# Patient Record
Sex: Female | Born: 1935
Health system: Southern US, Community
[De-identification: ages and names within clinical notes are randomized; demographics above are authoritative.]

## PROBLEM LIST (undated history)

## (undated) DIAGNOSIS — F419 Anxiety disorder, unspecified: Secondary | ICD-10-CM

## (undated) DIAGNOSIS — E782 Mixed hyperlipidemia: Secondary | ICD-10-CM

## (undated) DIAGNOSIS — I10 Essential (primary) hypertension: Secondary | ICD-10-CM

## (undated) DIAGNOSIS — R079 Chest pain, unspecified: Secondary | ICD-10-CM

## (undated) DIAGNOSIS — H3530 Unspecified macular degeneration: Secondary | ICD-10-CM

## (undated) DIAGNOSIS — M1990 Unspecified osteoarthritis, unspecified site: Secondary | ICD-10-CM

## (undated) DIAGNOSIS — K219 Gastro-esophageal reflux disease without esophagitis: Secondary | ICD-10-CM

## (undated) DIAGNOSIS — M810 Age-related osteoporosis without current pathological fracture: Secondary | ICD-10-CM

## (undated) DIAGNOSIS — R9431 Abnormal electrocardiogram [ECG] [EKG]: Secondary | ICD-10-CM

## (undated) DIAGNOSIS — S329XXA Fracture of unspecified parts of lumbosacral spine and pelvis, initial encounter for closed fracture: Secondary | ICD-10-CM

## (undated) DIAGNOSIS — T7840XA Allergy, unspecified, initial encounter: Secondary | ICD-10-CM

## (undated) DIAGNOSIS — H269 Unspecified cataract: Secondary | ICD-10-CM

## (undated) HISTORY — PX: REFRACTIVE SURGERY: SHX103

## (undated) HISTORY — DX: Fracture of unspecified parts of lumbosacral spine and pelvis, initial encounter for closed fracture: S32.9XXA

## (undated) HISTORY — DX: Abnormal electrocardiogram (ECG) (EKG): R94.31

## (undated) HISTORY — DX: Anxiety disorder, unspecified: F41.9

## (undated) HISTORY — DX: Gastro-esophageal reflux disease without esophagitis: K21.9

## (undated) HISTORY — DX: Unspecified cataract: H26.9

## (undated) HISTORY — PX: COLONOSCOPY: SHX174

## (undated) HISTORY — DX: Chest pain, unspecified: R07.9

## (undated) HISTORY — DX: Age-related osteoporosis without current pathological fracture: M81.0

## (undated) HISTORY — DX: Essential (primary) hypertension: I10

## (undated) HISTORY — DX: Allergy, unspecified, initial encounter: T78.40XA

## (undated) HISTORY — DX: Unspecified osteoarthritis, unspecified site: M19.90

## (undated) HISTORY — PX: APPENDECTOMY: SHX54

## (undated) HISTORY — PX: TONSILLECTOMY: SUR1361

## (undated) HISTORY — DX: Mixed hyperlipidemia: E78.2

## (undated) HISTORY — DX: Unspecified macular degeneration: H35.30

## (undated) HISTORY — PX: ABDOMINAL HYSTERECTOMY: SHX81

---

## 2001-01-17 ENCOUNTER — Inpatient Hospital Stay (HOSPITAL_COMMUNITY): Admission: RE | Admit: 2001-01-17 | Discharge: 2001-01-19 | Payer: Self-pay | Admitting: Urology

## 2006-09-30 ENCOUNTER — Encounter: Admission: RE | Admit: 2006-09-30 | Discharge: 2006-09-30 | Payer: Self-pay | Admitting: Family Medicine

## 2007-04-20 ENCOUNTER — Encounter: Admission: RE | Admit: 2007-04-20 | Discharge: 2007-04-20 | Payer: Self-pay | Admitting: Family Medicine

## 2008-04-20 HISTORY — PX: ESOPHAGOGASTRODUODENOSCOPY: SHX1529

## 2008-09-25 ENCOUNTER — Encounter: Admission: RE | Admit: 2008-09-25 | Discharge: 2008-09-25 | Payer: Self-pay | Admitting: Family Medicine

## 2009-11-12 ENCOUNTER — Encounter: Admission: RE | Admit: 2009-11-12 | Discharge: 2009-11-12 | Payer: Self-pay | Admitting: Family Medicine

## 2010-05-11 ENCOUNTER — Encounter: Payer: Self-pay | Admitting: Family Medicine

## 2010-09-05 NOTE — Discharge Summary (Signed)
[facility]  Patient:    Jodi Jefferson, Jodi Jefferson Visit Number: [id number] MRN: [id number]          Service Type: SUR Location: 3W 0355 01 Attending Physician:  Lauree Chandler Dictated by:   Maretta Bees. Vonita Moss, M.D. Admit Date:  01/17/2001 Disc. Date: 01/19/01                             Discharge Summary  FINAL DIAGNOSES: 1. Third degree cystocele. 2. Osteoporosis.  PROCEDURE:  Vaginosacropexy and suprapubic bladder suspension January 17, 2001.  HISTORY OF PRESENT ILLNESS:  This 77 year old white female has had pain and discomfort from a protruding bladder and was counseled about surgical correction and admitted for that reason.  She was cleared for surgery by her medical doctor after performing an echocardiogram with Doppler studies.  Her main medical problem is some osteoporosis.  PHYSICAL EXAMINATION:  Revealed third degree cystocele.  MEDICATIONS: 1. Fosamax 10 mg a day. 2. Estradial 1 mg daily. 3. Centrum Silver vitamins q.d. 4. Os-Cal vitamin D.  HOSPITAL COURSE:  After admission she underwent a vaginosacropexy and suprapubic bladder suspension.  Her only problem postoperatively was some nausea controlled with Zofran and Reglan.  She was eating well and ambulating well with little pain and afebrile.  On January 19, 2001 she was ready for discharge.  DISCHARGE INSTRUCTIONS:  She will go home on her regular medications, but will hold off her Fosamax for a couple of more days and she will use oxycodone for pain and will take five more days of Levaquin 1/2 g daily.  She will see me next week in the office at follow up.  She was sent home on diet as tolerated and light activity for six weeks.  She was sent home in good condition. Dictated by:   Maretta Bees. Vonita Moss, M.D. Attending Physician:  Lauree Chandler DD:  01/19/01 TD:  01/19/01 Job: [id number]

## 2010-09-05 NOTE — Op Note (Signed)
[facility]  Patient:    Jodi Jefferson, Jodi Jefferson Visit Number: [id number] MRN: [id number]          Service Type: SUR Location: 1S X001 01 Attending Physician:  Lauree Chandler Dictated by:   Maretta Bees. Vonita Moss, M.D. Proc. Date: 01/17/01 Admit Date:  01/17/2001                             Operative Report  PREOPERATIVE DIAGNOSIS:  Third degree cystocele.  POSTOPERATIVE DIAGNOSIS:  Third degree cystocele.  PROCEDURE:  Vaginosacropexy and suprapubic bladder suspension.  SURGEON:  Maretta Bees. Vonita Moss, M.D.  ASSISTANT:  Excell Seltzer. Annabell Howells, M.D.  ANESTHESIA:  General.  INDICATION:  This 77 year old white female has had a long history of a cystocele and has become much worse lately and protruding out the introitus and causing her pelvic pain and pressure.  She is cleared for surgery by her medical doctor.  She has had a previous total abdominal hysterectomy.  DESCRIPTION OF PROCEDURE:  The patient was brought to the operating room, placed in the supine position in frog-leg position and her lower abdomen and vaginal canal were prepped and draped in usual fashion.  A 22-French Foley catheter was inserted into the bladder.  A suprapubic incision was made and the bladder identified and very top of the bladder secured and identified with a figure-of-eight suture of 0 chromic catgut.  The peritoneum was then opened and the small and large intestine retracted superiorly.  The sacral promontory was exposed and an incision was made into that and a few tiny bleeders were coagulated and two sutures of 2-0 Prolene were placed on each side of the sacral promontory for later suturing to her Marlex mesh.  Her enterocele on the right side was then closed with a pursestring suture of 2-0 chromic catgut.  The top of the vaginal vault was then identified with a narrow Deaver placed per vagina.  An incision was made in the peritoneum and dissected back to allow [place] to place  two sutures of 2-0 Prolene on the right and left sides posteriorly and two more on the right and left sides anteriorly.  The ends of the 1 x 3-inch Marlex mesh which had been soaked in antibiotic solution were sutured to the front and to the back of the vaginal vault and then the midsection of this sutured to the previously placed Prolene sutures in the sacral promontory; this suspended the vaginal vault and canal quite nicely. The Marlex mesh was then reperitonealized and there was no evidence of any herniation defects.  The bladder was then identified just above the bladder neck in the midline and figure-of-eight suture of 0 chromic catgut was placed and sutured to the top of the symphysis pubis and two more midline bladder sutures were later placed and sewn to the overlying rectus fascia and the initial very superior anterior suture in the bladder was sewn to the rectus fascia, suspending the bladder well.  The wound was irrigated with triple-antibiotic solution.  The peritoneum was closed with a running 2-0 chromic catgut.  The rectus fascia was closed with running #1 PDS.  Skin was closed with skin staples.  The wound was cleaned, dressed with dry sterile gauze dressings, Foley catheter was connected to closed drainage and patient taken to recovery room in good condition with negligible blood loss and correct sponge, needle and instrument counts.  She tolerated procedure well and wound was dressed with  dry sterile gauze dressings. Dictated by:   Maretta Bees. Vonita Moss, M.D. Attending Physician:  Lauree Chandler DD:  01/17/01 TD:  01/17/01 Job: [id number]

## 2010-09-05 NOTE — H&P (Signed)
[facility]  Patient:    ROBBYE, DEDE Visit Number: [id number] MRN: [id number]          Service Type: SUR Location: 1S X001 01 Attending Physician:  Lauree Chandler Dictated by:   Maretta Bees. Vonita Moss, M.D. Admit Date:  01/17/2001                           History and Physical  HISTORY:  This 77 year old white female was seen by me in consultation for Dr. Nadine Counts on December 08, 2000 because of a long history of a bladder prolapse and exacerbation recently with protruding bladder out the vaginal introitus, causing her pelvic pressure and discomfort.  She had frequency, but no dysuria and no particular incontinence.  She was very physically active and wished this repaired.  I felt that a vaginosacropexy and suprapubic bladder suspension would be the most appropriate way to manage this.  She was cleared for surgery by Dr. Harrison Mons, without did an echocardiogram with Doppler.  this showed some LVH and an estimated ejection fraction of 65% to 70% and just some physiologic mitral and tricuspid regurgitation, but no concern about coronary artery disease.  PAST MEDICAL HISTORY: 1. Total abdominal hysterectomy and bilateral salpingo-oophorectomy. 2. Tonsillectomy. 3. Appendectomy. 4. Three vaginal deliveries. 5. History of osteoporosis. 6. No diabetes, heart attacks or strokes.  MEDICATIONS: 1. Fosamax 10 mg q.d. 2. Estradiol 1 mg q.d. 3. Centrum Silver vitamins. 4. Os-Cal with vitamin D.  ALLERGIES:  She is not allergic to medications, but sulfa upsets her intestinal tract.  HABITS:  She does not smoke or drink alcohol.  FAMILY HISTORY:  Noncontributory.  REVIEW OF SYSTEMS:  Noted on the health history form.  PHYSICAL EXAMINATION:  GENERAL:  Thin white female appearing her stated age in no acute distress.  NECK:  Supple.  CHEST:  Clear.  HEART:  Tones are regular.  ABDOMEN:  Soft and nontender.  No CVA or bladder tenderness.   No hepatosplenomegaly.  No hernias.  No inguinal lymph nodes.  PELVIC:  She has a large, protruding cystocyele.  The uterus and cervix are absent.  The perineum is normal.  EXTREMITIES:  No peripheral edema.  IMPRESSION: 1. Cystocele. 2. Osteoporosis.  PLAN:  Vaginosacropexy and suprapubic bladder suspension. Dictated by:   Maretta Bees. Vonita Moss, M.D. Attending Physician:  Lauree Chandler DD:  01/17/01 TD:  01/17/01 Job: [id number]

## 2010-12-02 ENCOUNTER — Other Ambulatory Visit: Payer: Self-pay | Admitting: Family Medicine

## 2010-12-02 DIAGNOSIS — Z1231 Encounter for screening mammogram for malignant neoplasm of breast: Secondary | ICD-10-CM

## 2010-12-17 ENCOUNTER — Ambulatory Visit: Payer: Self-pay

## 2010-12-30 ENCOUNTER — Ambulatory Visit
Admission: RE | Admit: 2010-12-30 | Discharge: 2010-12-30 | Disposition: A | Discharge status: Home / Self Care | Payer: Medicare Other | Source: Ambulatory Visit | Attending: Family Medicine | Admitting: Family Medicine

## 2010-12-30 DIAGNOSIS — Z1231 Encounter for screening mammogram for malignant neoplasm of breast: Secondary | ICD-10-CM

## 2011-05-01 DIAGNOSIS — M1710 Unilateral primary osteoarthritis, unspecified knee: Secondary | ICD-10-CM | POA: Diagnosis not present

## 2011-05-01 DIAGNOSIS — M25569 Pain in unspecified knee: Secondary | ICD-10-CM | POA: Diagnosis not present

## 2011-06-03 DIAGNOSIS — H353190 Nonexudative age-related macular degeneration, unspecified eye, stage unspecified: Secondary | ICD-10-CM | POA: Diagnosis not present

## 2011-06-03 DIAGNOSIS — H04129 Dry eye syndrome of unspecified lacrimal gland: Secondary | ICD-10-CM | POA: Diagnosis not present

## 2011-06-25 DIAGNOSIS — M159 Polyosteoarthritis, unspecified: Secondary | ICD-10-CM | POA: Diagnosis not present

## 2011-06-25 DIAGNOSIS — M810 Age-related osteoporosis without current pathological fracture: Secondary | ICD-10-CM | POA: Diagnosis not present

## 2011-06-25 DIAGNOSIS — J069 Acute upper respiratory infection, unspecified: Secondary | ICD-10-CM | POA: Diagnosis not present

## 2011-06-25 DIAGNOSIS — Z79899 Other long term (current) drug therapy: Secondary | ICD-10-CM | POA: Diagnosis not present

## 2011-07-23 DIAGNOSIS — Z79899 Other long term (current) drug therapy: Secondary | ICD-10-CM | POA: Diagnosis not present

## 2011-07-23 DIAGNOSIS — R079 Chest pain, unspecified: Secondary | ICD-10-CM | POA: Diagnosis not present

## 2011-08-04 DIAGNOSIS — M810 Age-related osteoporosis without current pathological fracture: Secondary | ICD-10-CM | POA: Diagnosis not present

## 2011-08-10 DIAGNOSIS — R3915 Urgency of urination: Secondary | ICD-10-CM | POA: Diagnosis not present

## 2011-08-10 DIAGNOSIS — J310 Chronic rhinitis: Secondary | ICD-10-CM | POA: Diagnosis not present

## 2011-08-27 DIAGNOSIS — J00 Acute nasopharyngitis [common cold]: Secondary | ICD-10-CM | POA: Diagnosis not present

## 2011-08-28 DIAGNOSIS — J029 Acute pharyngitis, unspecified: Secondary | ICD-10-CM | POA: Diagnosis not present

## 2011-10-29 DIAGNOSIS — F411 Generalized anxiety disorder: Secondary | ICD-10-CM | POA: Diagnosis not present

## 2011-10-29 DIAGNOSIS — L259 Unspecified contact dermatitis, unspecified cause: Secondary | ICD-10-CM | POA: Diagnosis not present

## 2011-11-02 DIAGNOSIS — L259 Unspecified contact dermatitis, unspecified cause: Secondary | ICD-10-CM | POA: Diagnosis not present

## 2011-12-29 DIAGNOSIS — D239 Other benign neoplasm of skin, unspecified: Secondary | ICD-10-CM | POA: Diagnosis not present

## 2012-01-13 ENCOUNTER — Other Ambulatory Visit: Payer: Self-pay | Admitting: Family Medicine

## 2012-01-13 DIAGNOSIS — Z1231 Encounter for screening mammogram for malignant neoplasm of breast: Secondary | ICD-10-CM

## 2012-01-28 DIAGNOSIS — Z23 Encounter for immunization: Secondary | ICD-10-CM | POA: Diagnosis not present

## 2012-02-15 ENCOUNTER — Ambulatory Visit
Admission: RE | Admit: 2012-02-15 | Discharge: 2012-02-15 | Disposition: A | Discharge status: Home / Self Care | Payer: Medicare Other | Source: Ambulatory Visit | Attending: Family Medicine | Admitting: Family Medicine

## 2012-02-15 DIAGNOSIS — Z1231 Encounter for screening mammogram for malignant neoplasm of breast: Secondary | ICD-10-CM | POA: Diagnosis not present

## 2012-02-19 DIAGNOSIS — J029 Acute pharyngitis, unspecified: Secondary | ICD-10-CM | POA: Diagnosis not present

## 2012-03-23 DIAGNOSIS — H9209 Otalgia, unspecified ear: Secondary | ICD-10-CM | POA: Diagnosis not present

## 2012-03-23 DIAGNOSIS — R5383 Other fatigue: Secondary | ICD-10-CM | POA: Diagnosis not present

## 2012-03-23 DIAGNOSIS — E559 Vitamin D deficiency, unspecified: Secondary | ICD-10-CM | POA: Diagnosis not present

## 2012-03-23 DIAGNOSIS — R5381 Other malaise: Secondary | ICD-10-CM | POA: Diagnosis not present

## 2012-03-23 DIAGNOSIS — G4700 Insomnia, unspecified: Secondary | ICD-10-CM | POA: Diagnosis not present

## 2012-03-23 DIAGNOSIS — K219 Gastro-esophageal reflux disease without esophagitis: Secondary | ICD-10-CM | POA: Diagnosis not present

## 2012-03-23 DIAGNOSIS — M1990 Unspecified osteoarthritis, unspecified site: Secondary | ICD-10-CM | POA: Diagnosis not present

## 2012-03-23 DIAGNOSIS — E782 Mixed hyperlipidemia: Secondary | ICD-10-CM | POA: Diagnosis not present

## 2012-04-07 DIAGNOSIS — H04129 Dry eye syndrome of unspecified lacrimal gland: Secondary | ICD-10-CM | POA: Diagnosis not present

## 2012-05-30 DIAGNOSIS — Z1211 Encounter for screening for malignant neoplasm of colon: Secondary | ICD-10-CM | POA: Diagnosis not present

## 2012-07-07 DIAGNOSIS — E782 Mixed hyperlipidemia: Secondary | ICD-10-CM | POA: Diagnosis not present

## 2012-08-04 DIAGNOSIS — M810 Age-related osteoporosis without current pathological fracture: Secondary | ICD-10-CM | POA: Diagnosis not present

## 2012-11-21 DIAGNOSIS — K219 Gastro-esophageal reflux disease without esophagitis: Secondary | ICD-10-CM | POA: Diagnosis not present

## 2012-11-21 DIAGNOSIS — Z0000 Encounter for general adult medical examination without abnormal findings: Secondary | ICD-10-CM | POA: Diagnosis not present

## 2012-11-21 DIAGNOSIS — I10 Essential (primary) hypertension: Secondary | ICD-10-CM | POA: Diagnosis not present

## 2013-01-27 DIAGNOSIS — Z23 Encounter for immunization: Secondary | ICD-10-CM | POA: Diagnosis not present

## 2013-01-27 DIAGNOSIS — L821 Other seborrheic keratosis: Secondary | ICD-10-CM | POA: Diagnosis not present

## 2013-02-07 DIAGNOSIS — M25519 Pain in unspecified shoulder: Secondary | ICD-10-CM | POA: Diagnosis not present

## 2013-02-07 DIAGNOSIS — I10 Essential (primary) hypertension: Secondary | ICD-10-CM | POA: Diagnosis not present

## 2013-02-23 ENCOUNTER — Other Ambulatory Visit: Payer: Self-pay

## 2013-02-23 DIAGNOSIS — Z1231 Encounter for screening mammogram for malignant neoplasm of breast: Secondary | ICD-10-CM

## 2013-05-15 DIAGNOSIS — D539 Nutritional anemia, unspecified: Secondary | ICD-10-CM | POA: Diagnosis not present

## 2013-05-15 DIAGNOSIS — G4700 Insomnia, unspecified: Secondary | ICD-10-CM | POA: Diagnosis not present

## 2013-05-15 DIAGNOSIS — E559 Vitamin D deficiency, unspecified: Secondary | ICD-10-CM | POA: Diagnosis not present

## 2013-05-15 DIAGNOSIS — M810 Age-related osteoporosis without current pathological fracture: Secondary | ICD-10-CM | POA: Diagnosis not present

## 2013-05-15 DIAGNOSIS — M1990 Unspecified osteoarthritis, unspecified site: Secondary | ICD-10-CM | POA: Diagnosis not present

## 2013-05-15 DIAGNOSIS — I10 Essential (primary) hypertension: Secondary | ICD-10-CM | POA: Diagnosis not present

## 2013-06-14 DIAGNOSIS — H353190 Nonexudative age-related macular degeneration, unspecified eye, stage unspecified: Secondary | ICD-10-CM | POA: Diagnosis not present

## 2013-07-04 DIAGNOSIS — Z1231 Encounter for screening mammogram for malignant neoplasm of breast: Secondary | ICD-10-CM | POA: Diagnosis not present

## 2013-07-31 DIAGNOSIS — Z79899 Other long term (current) drug therapy: Secondary | ICD-10-CM | POA: Diagnosis not present

## 2013-08-08 DIAGNOSIS — H2589 Other age-related cataract: Secondary | ICD-10-CM | POA: Diagnosis not present

## 2013-08-08 DIAGNOSIS — H353190 Nonexudative age-related macular degeneration, unspecified eye, stage unspecified: Secondary | ICD-10-CM | POA: Diagnosis not present

## 2013-08-11 DIAGNOSIS — R209 Unspecified disturbances of skin sensation: Secondary | ICD-10-CM | POA: Diagnosis not present

## 2013-08-11 DIAGNOSIS — F411 Generalized anxiety disorder: Secondary | ICD-10-CM | POA: Diagnosis not present

## 2013-08-11 DIAGNOSIS — I10 Essential (primary) hypertension: Secondary | ICD-10-CM | POA: Diagnosis not present

## 2013-08-21 DIAGNOSIS — M810 Age-related osteoporosis without current pathological fracture: Secondary | ICD-10-CM | POA: Diagnosis not present

## 2013-08-31 DIAGNOSIS — J0100 Acute maxillary sinusitis, unspecified: Secondary | ICD-10-CM | POA: Diagnosis not present

## 2013-08-31 DIAGNOSIS — J029 Acute pharyngitis, unspecified: Secondary | ICD-10-CM | POA: Diagnosis not present

## 2013-11-28 DIAGNOSIS — M25569 Pain in unspecified knee: Secondary | ICD-10-CM | POA: Diagnosis not present

## 2013-11-29 DIAGNOSIS — M25562 Pain in left knee: Secondary | ICD-10-CM

## 2013-11-29 DIAGNOSIS — M1710 Unilateral primary osteoarthritis, unspecified knee: Secondary | ICD-10-CM | POA: Diagnosis not present

## 2013-11-29 DIAGNOSIS — M1712 Unilateral primary osteoarthritis, left knee: Secondary | ICD-10-CM | POA: Insufficient documentation

## 2013-11-29 DIAGNOSIS — M25569 Pain in unspecified knee: Secondary | ICD-10-CM | POA: Diagnosis not present

## 2013-11-29 HISTORY — DX: Unilateral primary osteoarthritis, left knee: M17.12

## 2013-11-29 HISTORY — DX: Pain in left knee: M25.562

## 2014-01-11 DIAGNOSIS — M1710 Unilateral primary osteoarthritis, unspecified knee: Secondary | ICD-10-CM | POA: Diagnosis not present

## 2014-02-08 DIAGNOSIS — Z23 Encounter for immunization: Secondary | ICD-10-CM | POA: Diagnosis not present

## 2014-05-16 DIAGNOSIS — Z0000 Encounter for general adult medical examination without abnormal findings: Secondary | ICD-10-CM | POA: Diagnosis not present

## 2014-05-16 DIAGNOSIS — D509 Iron deficiency anemia, unspecified: Secondary | ICD-10-CM | POA: Diagnosis not present

## 2014-05-16 DIAGNOSIS — M7121 Synovial cyst of popliteal space [Baker], right knee: Secondary | ICD-10-CM | POA: Diagnosis not present

## 2014-05-16 DIAGNOSIS — I10 Essential (primary) hypertension: Secondary | ICD-10-CM | POA: Diagnosis not present

## 2014-05-16 DIAGNOSIS — E559 Vitamin D deficiency, unspecified: Secondary | ICD-10-CM | POA: Diagnosis not present

## 2014-05-16 DIAGNOSIS — N3941 Urge incontinence: Secondary | ICD-10-CM | POA: Diagnosis not present

## 2014-05-16 DIAGNOSIS — E782 Mixed hyperlipidemia: Secondary | ICD-10-CM | POA: Diagnosis not present

## 2014-05-16 DIAGNOSIS — Z79899 Other long term (current) drug therapy: Secondary | ICD-10-CM | POA: Diagnosis not present

## 2014-05-16 DIAGNOSIS — M1990 Unspecified osteoarthritis, unspecified site: Secondary | ICD-10-CM | POA: Diagnosis not present

## 2014-05-21 DIAGNOSIS — M7121 Synovial cyst of popliteal space [Baker], right knee: Secondary | ICD-10-CM | POA: Diagnosis not present

## 2014-06-04 DIAGNOSIS — M1712 Unilateral primary osteoarthritis, left knee: Secondary | ICD-10-CM | POA: Diagnosis not present

## 2014-07-30 DIAGNOSIS — M1712 Unilateral primary osteoarthritis, left knee: Secondary | ICD-10-CM | POA: Diagnosis not present

## 2014-08-15 DIAGNOSIS — H25813 Combined forms of age-related cataract, bilateral: Secondary | ICD-10-CM | POA: Diagnosis not present

## 2014-08-21 DIAGNOSIS — E780 Pure hypercholesterolemia: Secondary | ICD-10-CM | POA: Diagnosis not present

## 2014-08-21 DIAGNOSIS — Z79899 Other long term (current) drug therapy: Secondary | ICD-10-CM | POA: Diagnosis not present

## 2014-09-14 DIAGNOSIS — M810 Age-related osteoporosis without current pathological fracture: Secondary | ICD-10-CM | POA: Diagnosis not present

## 2014-11-09 DIAGNOSIS — M25562 Pain in left knee: Secondary | ICD-10-CM | POA: Diagnosis not present

## 2014-11-09 DIAGNOSIS — M1712 Unilateral primary osteoarthritis, left knee: Secondary | ICD-10-CM | POA: Diagnosis not present

## 2014-11-15 DIAGNOSIS — M1712 Unilateral primary osteoarthritis, left knee: Secondary | ICD-10-CM | POA: Diagnosis not present

## 2015-02-20 DIAGNOSIS — Z23 Encounter for immunization: Secondary | ICD-10-CM | POA: Diagnosis not present

## 2015-03-22 DIAGNOSIS — I10 Essential (primary) hypertension: Secondary | ICD-10-CM | POA: Diagnosis not present

## 2015-03-22 DIAGNOSIS — M7121 Synovial cyst of popliteal space [Baker], right knee: Secondary | ICD-10-CM | POA: Diagnosis not present

## 2015-03-22 DIAGNOSIS — H9202 Otalgia, left ear: Secondary | ICD-10-CM | POA: Diagnosis not present

## 2015-03-27 DIAGNOSIS — M170 Bilateral primary osteoarthritis of knee: Secondary | ICD-10-CM | POA: Diagnosis not present

## 2015-05-20 DIAGNOSIS — E559 Vitamin D deficiency, unspecified: Secondary | ICD-10-CM | POA: Diagnosis not present

## 2015-05-20 DIAGNOSIS — R296 Repeated falls: Secondary | ICD-10-CM | POA: Diagnosis not present

## 2015-05-20 DIAGNOSIS — M25551 Pain in right hip: Secondary | ICD-10-CM | POA: Diagnosis not present

## 2015-05-20 DIAGNOSIS — I10 Essential (primary) hypertension: Secondary | ICD-10-CM | POA: Diagnosis not present

## 2015-05-29 DIAGNOSIS — M25551 Pain in right hip: Secondary | ICD-10-CM | POA: Diagnosis not present

## 2015-05-29 DIAGNOSIS — R262 Difficulty in walking, not elsewhere classified: Secondary | ICD-10-CM | POA: Diagnosis not present

## 2015-05-29 DIAGNOSIS — R2689 Other abnormalities of gait and mobility: Secondary | ICD-10-CM | POA: Diagnosis not present

## 2015-05-29 DIAGNOSIS — M6281 Muscle weakness (generalized): Secondary | ICD-10-CM | POA: Diagnosis not present

## 2015-06-04 DIAGNOSIS — R2689 Other abnormalities of gait and mobility: Secondary | ICD-10-CM | POA: Diagnosis not present

## 2015-06-04 DIAGNOSIS — R262 Difficulty in walking, not elsewhere classified: Secondary | ICD-10-CM | POA: Diagnosis not present

## 2015-06-04 DIAGNOSIS — M25551 Pain in right hip: Secondary | ICD-10-CM | POA: Diagnosis not present

## 2015-06-04 DIAGNOSIS — M6281 Muscle weakness (generalized): Secondary | ICD-10-CM | POA: Diagnosis not present

## 2015-06-06 DIAGNOSIS — M25551 Pain in right hip: Secondary | ICD-10-CM | POA: Diagnosis not present

## 2015-06-06 DIAGNOSIS — R2689 Other abnormalities of gait and mobility: Secondary | ICD-10-CM | POA: Diagnosis not present

## 2015-06-06 DIAGNOSIS — R262 Difficulty in walking, not elsewhere classified: Secondary | ICD-10-CM | POA: Diagnosis not present

## 2015-06-06 DIAGNOSIS — M6281 Muscle weakness (generalized): Secondary | ICD-10-CM | POA: Diagnosis not present

## 2015-06-18 DIAGNOSIS — Z0000 Encounter for general adult medical examination without abnormal findings: Secondary | ICD-10-CM | POA: Diagnosis not present

## 2015-06-18 DIAGNOSIS — S329XXA Fracture of unspecified parts of lumbosacral spine and pelvis, initial encounter for closed fracture: Secondary | ICD-10-CM | POA: Diagnosis not present

## 2015-06-18 DIAGNOSIS — I10 Essential (primary) hypertension: Secondary | ICD-10-CM | POA: Diagnosis not present

## 2015-06-25 DIAGNOSIS — S32591A Other specified fracture of right pubis, initial encounter for closed fracture: Secondary | ICD-10-CM | POA: Diagnosis not present

## 2015-08-08 DIAGNOSIS — S32591A Other specified fracture of right pubis, initial encounter for closed fracture: Secondary | ICD-10-CM | POA: Diagnosis not present

## 2015-09-03 DIAGNOSIS — S32591A Other specified fracture of right pubis, initial encounter for closed fracture: Secondary | ICD-10-CM | POA: Diagnosis not present

## 2015-09-11 DIAGNOSIS — H40039 Anatomical narrow angle, unspecified eye: Secondary | ICD-10-CM | POA: Diagnosis not present

## 2015-09-11 DIAGNOSIS — H25813 Combined forms of age-related cataract, bilateral: Secondary | ICD-10-CM | POA: Diagnosis not present

## 2015-09-26 DIAGNOSIS — M25552 Pain in left hip: Secondary | ICD-10-CM | POA: Diagnosis not present

## 2015-09-26 DIAGNOSIS — M25551 Pain in right hip: Secondary | ICD-10-CM | POA: Diagnosis not present

## 2015-11-06 DIAGNOSIS — Z13820 Encounter for screening for osteoporosis: Secondary | ICD-10-CM | POA: Diagnosis not present

## 2015-11-07 DIAGNOSIS — G8929 Other chronic pain: Secondary | ICD-10-CM | POA: Diagnosis not present

## 2015-11-07 DIAGNOSIS — M25561 Pain in right knee: Secondary | ICD-10-CM | POA: Diagnosis not present

## 2015-11-07 DIAGNOSIS — M1712 Unilateral primary osteoarthritis, left knee: Secondary | ICD-10-CM | POA: Diagnosis not present

## 2015-11-07 DIAGNOSIS — M1711 Unilateral primary osteoarthritis, right knee: Secondary | ICD-10-CM | POA: Diagnosis not present

## 2015-11-12 DIAGNOSIS — Z1389 Encounter for screening for other disorder: Secondary | ICD-10-CM | POA: Diagnosis not present

## 2015-11-12 DIAGNOSIS — M810 Age-related osteoporosis without current pathological fracture: Secondary | ICD-10-CM | POA: Diagnosis not present

## 2015-11-12 DIAGNOSIS — Z9181 History of falling: Secondary | ICD-10-CM | POA: Diagnosis not present

## 2015-11-12 DIAGNOSIS — I10 Essential (primary) hypertension: Secondary | ICD-10-CM | POA: Diagnosis not present

## 2015-11-21 DIAGNOSIS — S32591D Other specified fracture of right pubis, subsequent encounter for fracture with routine healing: Secondary | ICD-10-CM | POA: Diagnosis not present

## 2015-11-21 DIAGNOSIS — M6281 Muscle weakness (generalized): Secondary | ICD-10-CM | POA: Diagnosis not present

## 2015-11-21 DIAGNOSIS — R2681 Unsteadiness on feet: Secondary | ICD-10-CM | POA: Diagnosis not present

## 2015-11-21 DIAGNOSIS — R2689 Other abnormalities of gait and mobility: Secondary | ICD-10-CM | POA: Diagnosis not present

## 2015-11-25 DIAGNOSIS — R2689 Other abnormalities of gait and mobility: Secondary | ICD-10-CM | POA: Diagnosis not present

## 2015-11-25 DIAGNOSIS — M6281 Muscle weakness (generalized): Secondary | ICD-10-CM | POA: Diagnosis not present

## 2015-11-25 DIAGNOSIS — R2681 Unsteadiness on feet: Secondary | ICD-10-CM | POA: Diagnosis not present

## 2015-11-25 DIAGNOSIS — S32591D Other specified fracture of right pubis, subsequent encounter for fracture with routine healing: Secondary | ICD-10-CM | POA: Diagnosis not present

## 2015-12-04 DIAGNOSIS — M810 Age-related osteoporosis without current pathological fracture: Secondary | ICD-10-CM | POA: Diagnosis not present

## 2015-12-10 DIAGNOSIS — M25551 Pain in right hip: Secondary | ICD-10-CM | POA: Diagnosis not present

## 2015-12-12 DIAGNOSIS — M79641 Pain in right hand: Secondary | ICD-10-CM | POA: Diagnosis not present

## 2015-12-13 ENCOUNTER — Other Ambulatory Visit: Payer: Self-pay

## 2015-12-13 DIAGNOSIS — M25551 Pain in right hip: Secondary | ICD-10-CM | POA: Diagnosis not present

## 2015-12-16 DIAGNOSIS — R2681 Unsteadiness on feet: Secondary | ICD-10-CM | POA: Diagnosis not present

## 2015-12-16 DIAGNOSIS — M6281 Muscle weakness (generalized): Secondary | ICD-10-CM | POA: Diagnosis not present

## 2015-12-17 DIAGNOSIS — M25551 Pain in right hip: Secondary | ICD-10-CM | POA: Diagnosis not present

## 2015-12-19 DIAGNOSIS — M25551 Pain in right hip: Secondary | ICD-10-CM | POA: Diagnosis not present

## 2015-12-24 DIAGNOSIS — M25561 Pain in right knee: Secondary | ICD-10-CM | POA: Diagnosis not present

## 2015-12-24 DIAGNOSIS — M25652 Stiffness of left hip, not elsewhere classified: Secondary | ICD-10-CM | POA: Diagnosis not present

## 2015-12-24 DIAGNOSIS — M25562 Pain in left knee: Secondary | ICD-10-CM | POA: Diagnosis not present

## 2015-12-24 DIAGNOSIS — M25552 Pain in left hip: Secondary | ICD-10-CM | POA: Diagnosis not present

## 2015-12-24 DIAGNOSIS — M6281 Muscle weakness (generalized): Secondary | ICD-10-CM | POA: Diagnosis not present

## 2015-12-24 DIAGNOSIS — R5383 Other fatigue: Secondary | ICD-10-CM | POA: Diagnosis not present

## 2015-12-24 DIAGNOSIS — R2681 Unsteadiness on feet: Secondary | ICD-10-CM | POA: Diagnosis not present

## 2015-12-24 DIAGNOSIS — M25651 Stiffness of right hip, not elsewhere classified: Secondary | ICD-10-CM | POA: Diagnosis not present

## 2015-12-24 DIAGNOSIS — R2689 Other abnormalities of gait and mobility: Secondary | ICD-10-CM | POA: Diagnosis not present

## 2015-12-24 DIAGNOSIS — M25551 Pain in right hip: Secondary | ICD-10-CM | POA: Diagnosis not present

## 2015-12-27 DIAGNOSIS — M25551 Pain in right hip: Secondary | ICD-10-CM | POA: Diagnosis not present

## 2015-12-27 DIAGNOSIS — R2681 Unsteadiness on feet: Secondary | ICD-10-CM | POA: Diagnosis not present

## 2015-12-27 DIAGNOSIS — M25562 Pain in left knee: Secondary | ICD-10-CM | POA: Diagnosis not present

## 2015-12-27 DIAGNOSIS — R5383 Other fatigue: Secondary | ICD-10-CM | POA: Diagnosis not present

## 2015-12-27 DIAGNOSIS — M25552 Pain in left hip: Secondary | ICD-10-CM | POA: Diagnosis not present

## 2015-12-27 DIAGNOSIS — M25561 Pain in right knee: Secondary | ICD-10-CM | POA: Diagnosis not present

## 2016-01-03 DIAGNOSIS — R2681 Unsteadiness on feet: Secondary | ICD-10-CM | POA: Diagnosis not present

## 2016-01-03 DIAGNOSIS — M25562 Pain in left knee: Secondary | ICD-10-CM | POA: Diagnosis not present

## 2016-01-03 DIAGNOSIS — M25561 Pain in right knee: Secondary | ICD-10-CM | POA: Diagnosis not present

## 2016-01-03 DIAGNOSIS — R5383 Other fatigue: Secondary | ICD-10-CM | POA: Diagnosis not present

## 2016-01-03 DIAGNOSIS — M25552 Pain in left hip: Secondary | ICD-10-CM | POA: Diagnosis not present

## 2016-01-03 DIAGNOSIS — M25551 Pain in right hip: Secondary | ICD-10-CM | POA: Diagnosis not present

## 2016-01-07 DIAGNOSIS — R5383 Other fatigue: Secondary | ICD-10-CM | POA: Diagnosis not present

## 2016-01-07 DIAGNOSIS — R2681 Unsteadiness on feet: Secondary | ICD-10-CM | POA: Diagnosis not present

## 2016-01-07 DIAGNOSIS — M25562 Pain in left knee: Secondary | ICD-10-CM | POA: Diagnosis not present

## 2016-01-07 DIAGNOSIS — M25551 Pain in right hip: Secondary | ICD-10-CM | POA: Diagnosis not present

## 2016-01-07 DIAGNOSIS — M25552 Pain in left hip: Secondary | ICD-10-CM | POA: Diagnosis not present

## 2016-01-07 DIAGNOSIS — M25561 Pain in right knee: Secondary | ICD-10-CM | POA: Diagnosis not present

## 2016-01-10 DIAGNOSIS — M25562 Pain in left knee: Secondary | ICD-10-CM | POA: Diagnosis not present

## 2016-01-10 DIAGNOSIS — M25552 Pain in left hip: Secondary | ICD-10-CM | POA: Diagnosis not present

## 2016-01-10 DIAGNOSIS — R5383 Other fatigue: Secondary | ICD-10-CM | POA: Diagnosis not present

## 2016-01-10 DIAGNOSIS — M25551 Pain in right hip: Secondary | ICD-10-CM | POA: Diagnosis not present

## 2016-01-10 DIAGNOSIS — R2681 Unsteadiness on feet: Secondary | ICD-10-CM | POA: Diagnosis not present

## 2016-01-10 DIAGNOSIS — M25561 Pain in right knee: Secondary | ICD-10-CM | POA: Diagnosis not present

## 2016-02-27 DIAGNOSIS — Z23 Encounter for immunization: Secondary | ICD-10-CM | POA: Diagnosis not present

## 2016-02-27 DIAGNOSIS — Z1389 Encounter for screening for other disorder: Secondary | ICD-10-CM | POA: Diagnosis not present

## 2016-02-27 DIAGNOSIS — Z9181 History of falling: Secondary | ICD-10-CM | POA: Diagnosis not present

## 2016-02-27 DIAGNOSIS — Z79899 Other long term (current) drug therapy: Secondary | ICD-10-CM | POA: Diagnosis not present

## 2016-02-27 DIAGNOSIS — D519 Vitamin B12 deficiency anemia, unspecified: Secondary | ICD-10-CM | POA: Diagnosis not present

## 2016-02-27 DIAGNOSIS — E559 Vitamin D deficiency, unspecified: Secondary | ICD-10-CM | POA: Diagnosis not present

## 2016-02-27 DIAGNOSIS — M1990 Unspecified osteoarthritis, unspecified site: Secondary | ICD-10-CM | POA: Diagnosis not present

## 2016-02-27 DIAGNOSIS — E782 Mixed hyperlipidemia: Secondary | ICD-10-CM | POA: Diagnosis not present

## 2016-04-02 DIAGNOSIS — M25551 Pain in right hip: Secondary | ICD-10-CM | POA: Diagnosis not present

## 2016-04-02 DIAGNOSIS — M6281 Muscle weakness (generalized): Secondary | ICD-10-CM | POA: Diagnosis not present

## 2016-04-02 DIAGNOSIS — M25652 Stiffness of left hip, not elsewhere classified: Secondary | ICD-10-CM | POA: Diagnosis not present

## 2016-04-02 DIAGNOSIS — R2689 Other abnormalities of gait and mobility: Secondary | ICD-10-CM | POA: Diagnosis not present

## 2016-04-02 DIAGNOSIS — M25561 Pain in right knee: Secondary | ICD-10-CM | POA: Diagnosis not present

## 2016-04-02 DIAGNOSIS — M25552 Pain in left hip: Secondary | ICD-10-CM | POA: Diagnosis not present

## 2016-04-02 DIAGNOSIS — R2681 Unsteadiness on feet: Secondary | ICD-10-CM | POA: Diagnosis not present

## 2016-04-02 DIAGNOSIS — M25651 Stiffness of right hip, not elsewhere classified: Secondary | ICD-10-CM | POA: Diagnosis not present

## 2016-04-02 DIAGNOSIS — M25562 Pain in left knee: Secondary | ICD-10-CM | POA: Diagnosis not present

## 2016-04-02 DIAGNOSIS — R5383 Other fatigue: Secondary | ICD-10-CM | POA: Diagnosis not present

## 2016-06-09 DIAGNOSIS — M810 Age-related osteoporosis without current pathological fracture: Secondary | ICD-10-CM | POA: Diagnosis not present

## 2016-08-06 DIAGNOSIS — M25562 Pain in left knee: Secondary | ICD-10-CM | POA: Diagnosis not present

## 2016-08-06 DIAGNOSIS — M1712 Unilateral primary osteoarthritis, left knee: Secondary | ICD-10-CM | POA: Diagnosis not present

## 2016-08-06 DIAGNOSIS — M170 Bilateral primary osteoarthritis of knee: Secondary | ICD-10-CM | POA: Diagnosis not present

## 2016-09-16 DIAGNOSIS — H25813 Combined forms of age-related cataract, bilateral: Secondary | ICD-10-CM | POA: Diagnosis not present

## 2016-09-23 DIAGNOSIS — Z0000 Encounter for general adult medical examination without abnormal findings: Secondary | ICD-10-CM | POA: Diagnosis not present

## 2016-09-23 DIAGNOSIS — I10 Essential (primary) hypertension: Secondary | ICD-10-CM | POA: Diagnosis not present

## 2016-09-23 DIAGNOSIS — Z1389 Encounter for screening for other disorder: Secondary | ICD-10-CM | POA: Diagnosis not present

## 2016-09-23 DIAGNOSIS — R079 Chest pain, unspecified: Secondary | ICD-10-CM | POA: Diagnosis not present

## 2016-09-23 DIAGNOSIS — M1990 Unspecified osteoarthritis, unspecified site: Secondary | ICD-10-CM | POA: Diagnosis not present

## 2016-09-23 DIAGNOSIS — Z6822 Body mass index (BMI) 22.0-22.9, adult: Secondary | ICD-10-CM | POA: Diagnosis not present

## 2016-09-29 DIAGNOSIS — Z6822 Body mass index (BMI) 22.0-22.9, adult: Secondary | ICD-10-CM | POA: Diagnosis not present

## 2016-09-29 DIAGNOSIS — Z79899 Other long term (current) drug therapy: Secondary | ICD-10-CM | POA: Diagnosis not present

## 2016-09-29 DIAGNOSIS — E785 Hyperlipidemia, unspecified: Secondary | ICD-10-CM | POA: Diagnosis not present

## 2016-09-29 DIAGNOSIS — H9202 Otalgia, left ear: Secondary | ICD-10-CM | POA: Diagnosis not present

## 2016-09-29 DIAGNOSIS — I10 Essential (primary) hypertension: Secondary | ICD-10-CM | POA: Diagnosis not present

## 2016-10-04 DIAGNOSIS — H6990 Unspecified Eustachian tube disorder, unspecified ear: Secondary | ICD-10-CM | POA: Diagnosis not present

## 2016-10-04 DIAGNOSIS — J0100 Acute maxillary sinusitis, unspecified: Secondary | ICD-10-CM | POA: Diagnosis not present

## 2016-10-13 DIAGNOSIS — H9202 Otalgia, left ear: Secondary | ICD-10-CM | POA: Diagnosis not present

## 2016-10-13 DIAGNOSIS — I10 Essential (primary) hypertension: Secondary | ICD-10-CM | POA: Diagnosis not present

## 2016-10-13 DIAGNOSIS — H6983 Other specified disorders of Eustachian tube, bilateral: Secondary | ICD-10-CM | POA: Diagnosis not present

## 2016-10-13 DIAGNOSIS — J309 Allergic rhinitis, unspecified: Secondary | ICD-10-CM | POA: Diagnosis not present

## 2016-10-26 DIAGNOSIS — Z6821 Body mass index (BMI) 21.0-21.9, adult: Secondary | ICD-10-CM | POA: Diagnosis not present

## 2016-10-26 DIAGNOSIS — I10 Essential (primary) hypertension: Secondary | ICD-10-CM | POA: Diagnosis not present

## 2016-10-26 DIAGNOSIS — H6090 Unspecified otitis externa, unspecified ear: Secondary | ICD-10-CM | POA: Diagnosis not present

## 2016-10-30 DIAGNOSIS — I10 Essential (primary) hypertension: Secondary | ICD-10-CM | POA: Diagnosis not present

## 2016-10-30 DIAGNOSIS — G501 Atypical facial pain: Secondary | ICD-10-CM | POA: Diagnosis not present

## 2016-10-30 DIAGNOSIS — H9202 Otalgia, left ear: Secondary | ICD-10-CM | POA: Diagnosis not present

## 2016-11-10 DIAGNOSIS — H9202 Otalgia, left ear: Secondary | ICD-10-CM | POA: Diagnosis not present

## 2016-11-10 DIAGNOSIS — H903 Sensorineural hearing loss, bilateral: Secondary | ICD-10-CM | POA: Diagnosis not present

## 2016-11-10 DIAGNOSIS — M26622 Arthralgia of left temporomandibular joint: Secondary | ICD-10-CM | POA: Diagnosis not present

## 2016-12-08 DIAGNOSIS — M810 Age-related osteoporosis without current pathological fracture: Secondary | ICD-10-CM | POA: Diagnosis not present

## 2016-12-23 DIAGNOSIS — R0982 Postnasal drip: Secondary | ICD-10-CM | POA: Diagnosis not present

## 2016-12-23 DIAGNOSIS — M19041 Primary osteoarthritis, right hand: Secondary | ICD-10-CM | POA: Diagnosis not present

## 2016-12-23 DIAGNOSIS — J309 Allergic rhinitis, unspecified: Secondary | ICD-10-CM | POA: Diagnosis not present

## 2016-12-23 DIAGNOSIS — K219 Gastro-esophageal reflux disease without esophagitis: Secondary | ICD-10-CM | POA: Diagnosis not present

## 2016-12-31 DIAGNOSIS — Z23 Encounter for immunization: Secondary | ICD-10-CM | POA: Diagnosis not present

## 2016-12-31 DIAGNOSIS — K219 Gastro-esophageal reflux disease without esophagitis: Secondary | ICD-10-CM | POA: Diagnosis not present

## 2016-12-31 DIAGNOSIS — Z6821 Body mass index (BMI) 21.0-21.9, adult: Secondary | ICD-10-CM | POA: Diagnosis not present

## 2016-12-31 DIAGNOSIS — M545 Low back pain: Secondary | ICD-10-CM | POA: Diagnosis not present

## 2017-01-08 DIAGNOSIS — K219 Gastro-esophageal reflux disease without esophagitis: Secondary | ICD-10-CM | POA: Diagnosis not present

## 2017-01-13 DIAGNOSIS — K219 Gastro-esophageal reflux disease without esophagitis: Secondary | ICD-10-CM | POA: Diagnosis not present

## 2017-01-13 DIAGNOSIS — I10 Essential (primary) hypertension: Secondary | ICD-10-CM | POA: Diagnosis not present

## 2017-01-13 DIAGNOSIS — R9431 Abnormal electrocardiogram [ECG] [EKG]: Secondary | ICD-10-CM | POA: Diagnosis not present

## 2017-01-13 DIAGNOSIS — Z6822 Body mass index (BMI) 22.0-22.9, adult: Secondary | ICD-10-CM | POA: Diagnosis not present

## 2017-01-18 DIAGNOSIS — R9431 Abnormal electrocardiogram [ECG] [EKG]: Secondary | ICD-10-CM | POA: Diagnosis not present

## 2017-01-20 DIAGNOSIS — I10 Essential (primary) hypertension: Secondary | ICD-10-CM | POA: Diagnosis not present

## 2017-01-25 ENCOUNTER — Telehealth: Payer: Self-pay

## 2017-01-25 NOTE — Telephone Encounter (Signed)
L/m to call ofc to make [email or web address]

## 2017-01-26 DIAGNOSIS — F419 Anxiety disorder, unspecified: Secondary | ICD-10-CM

## 2017-01-26 DIAGNOSIS — M810 Age-related osteoporosis without current pathological fracture: Secondary | ICD-10-CM

## 2017-01-26 DIAGNOSIS — E782 Mixed hyperlipidemia: Secondary | ICD-10-CM

## 2017-01-26 DIAGNOSIS — M1990 Unspecified osteoarthritis, unspecified site: Secondary | ICD-10-CM

## 2017-01-26 DIAGNOSIS — R079 Chest pain, unspecified: Secondary | ICD-10-CM | POA: Insufficient documentation

## 2017-01-26 DIAGNOSIS — R9431 Abnormal electrocardiogram [ECG] [EKG]: Secondary | ICD-10-CM

## 2017-01-26 DIAGNOSIS — H3530 Unspecified macular degeneration: Secondary | ICD-10-CM

## 2017-01-26 DIAGNOSIS — I10 Essential (primary) hypertension: Secondary | ICD-10-CM

## 2017-01-26 DIAGNOSIS — S329XXA Fracture of unspecified parts of lumbosacral spine and pelvis, initial encounter for closed fracture: Secondary | ICD-10-CM

## 2017-01-26 DIAGNOSIS — K219 Gastro-esophageal reflux disease without esophagitis: Secondary | ICD-10-CM

## 2017-01-26 HISTORY — DX: Unspecified osteoarthritis, unspecified site: M19.90

## 2017-01-26 HISTORY — DX: Anxiety disorder, unspecified: F41.9

## 2017-01-26 HISTORY — DX: Abnormal electrocardiogram (ECG) (EKG): R94.31

## 2017-01-26 HISTORY — DX: Mixed hyperlipidemia: E78.2

## 2017-01-26 HISTORY — DX: Essential (primary) hypertension: I10

## 2017-01-26 HISTORY — DX: Fracture of unspecified parts of lumbosacral spine and pelvis, initial encounter for closed fracture: S32.9XXA

## 2017-01-26 HISTORY — DX: Age-related osteoporosis without current pathological fracture: M81.0

## 2017-01-26 HISTORY — DX: Unspecified macular degeneration: H35.30

## 2017-01-26 HISTORY — DX: Chest pain, unspecified: R07.9

## 2017-01-26 HISTORY — DX: Gastro-esophageal reflux disease without esophagitis: K21.9

## 2017-01-28 DIAGNOSIS — R1013 Epigastric pain: Secondary | ICD-10-CM | POA: Diagnosis not present

## 2017-01-28 DIAGNOSIS — K5900 Constipation, unspecified: Secondary | ICD-10-CM | POA: Diagnosis not present

## 2017-02-02 ENCOUNTER — Encounter: Payer: Self-pay | Admitting: Cardiology

## 2017-02-02 ENCOUNTER — Ambulatory Visit (INDEPENDENT_AMBULATORY_CARE_PROVIDER_SITE_OTHER): Payer: Medicare Other | Admitting: Cardiology

## 2017-02-02 VITALS — BP 122/64 | HR 56 | Ht 59.0 in | Wt 106.1 lb

## 2017-02-02 DIAGNOSIS — R9431 Abnormal electrocardiogram [ECG] [EKG]: Secondary | ICD-10-CM

## 2017-02-02 DIAGNOSIS — I10 Essential (primary) hypertension: Secondary | ICD-10-CM

## 2017-02-02 DIAGNOSIS — R079 Chest pain, unspecified: Secondary | ICD-10-CM | POA: Diagnosis not present

## 2017-02-02 DIAGNOSIS — R931 Abnormal findings on diagnostic imaging of heart and coronary circulation: Secondary | ICD-10-CM

## 2017-02-02 NOTE — Progress Notes (Signed)
Cardiology Office Note:    Date:  02/02/2017   ID:  Jodi Jefferson, DOB 04-23-1931, MRN [id number]  PCP:  [name], MD  Cardiologist:  Shirlee More, MD   Referring MD: [name], MD  ASSESSMENT:    1. Chest pain in adult   2. Abnormal EKG   3. Abnormal echocardiogram   4. Benign essential hypertension    PLAN:    In order of problems listed above:  1. Atypical but at risk of CAD, she'll need an ischemia evaluation  With the pharmacologic Myoview If high risk markers she will benefit from coronary angiography and revascularization. 2. Stable nonspecific EKG no evidence of myocardial infarction by echo 3. Limited test clinically no indication of endocarditis and I suspect she has senile calcification of the mitral leaflet. I would not advise a TEE. 4. Stable continue current treatment with an ace inhibitor  Next appointment 4 weeks   Medication Adjustments/Labs and Tests Ordered: Current medicines are reviewed at length with the patient today.  Concerns regarding medicines are outlined above.  Orders Placed This Encounter  Procedures  . Myocardial Perfusion Imaging   No orders of the defined types were placed in this encounter.    Chief Complaint  Patient presents with  . Chest Pain    abnormal echocardiogram    History of Present Illness:    Jodi Jefferson is a 83 y.o. female who is being seen today for the evaluation of an abnormal echocardiogram at the request of [place], John F. II, MD. She had chest burning and an EKG performed and subsequent echocardiogram. TTE 01/18/17: MV not well visualized, cannot rule out a small mobile vegetation, mild MR. Also mild AR and TR otherwise normal  Recently she had worsened esophageal symptoms. She complains of a burning sensation from the throat to the epigastrium related to meals and unrelated physical activity. Her symptoms have improved with H2 blocker. She has no known history of heart  disease shortness of breath palpitation or syncope. She's had no fever chills or recent invasive procedures. Outpatient CBC and sedimentation rate are normal. Unfortunately she is sedentary and cannot perform a treadmill stress test. She has no history of stroke or TIA.  Past Medical History:  Diagnosis Date  . Abnormal EKG 01/26/2017  . Anxiety 01/26/2017  . Arthritis 01/26/2017  . Benign essential hypertension 01/26/2017  . Chest pain 01/26/2017  . Closed pelvic fracture ([place]) 01/26/2017  . GERD (gastroesophageal reflux disease) 01/26/2017  . Hyperlipemia, mixed 01/26/2017  . Macular degeneration 01/26/2017  . Osteoporosis 01/26/2017    Past Surgical History:  Procedure Laterality Date  . ABDOMINAL HYSTERECTOMY    . APPENDECTOMY    . REFRACTIVE SURGERY    . TONSILLECTOMY      Current Medications: Current Meds  Medication Sig  . acetaminophen (ACETAMINOPHEN 8 HOUR) 650 MG CR tablet Take 650 mg by mouth every morning.  Marland Kitchen alum & mag hydroxide-simeth (MAALOX/MYLANTA) 200-200-20 MG/5ML suspension Take by mouth every 6 (six) hours as needed for indigestion or heartburn.  . calcium citrate-vitamin D (CITRACAL+D) 315-200 MG-UNIT tablet Take by mouth.  . Capsaicin (CAPZASIN-HP) 0.1 % CREA Apply topically every morning.  . Cholecalciferol (VITAMIN D-1000 MAX ST) 1000 units tablet Take by mouth.  . diclofenac sodium (VOLTAREN) 1 % GEL Apply topically every morning.  Marland Kitchen glucosamine-chondroitin 500-400 MG tablet Take by mouth.  Marland Kitchen lisinopril (PRINIVIL,ZESTRIL) 10 MG tablet 10 mg daily.  Vladimir Faster Glycol-Propyl Glycol (SYSTANE OP) Apply  to eye.  . ranitidine (ZANTAC) 300 MG tablet at bedtime.     Allergies:   Aspirin; Macrobid [nitrofurantoin macrocrystal]; Polyethylene glycol; Prednisone; Red yeast rice [cholestin]; and Sulfa antibiotics   Social History   Social History  . Marital status: Married    Spouse name: N/A  . Number of children: N/A  . Years of education: N/A   Social History  Main Topics  . Smoking status: Never Smoker  . Smokeless tobacco: Never Used  . Alcohol use No  . Drug use: No  . Sexual activity: Not Asked   Other Topics Concern  . None   Social History Narrative  . None     Family History: The patient's family history includes Heart attack in her father.  ROS:   ROS Please see the history of present illness.    Chronic knee pain All other systems reviewed and are negative.  EKGs/Labs/Other Studies Reviewed:    The following studies were reviewed today:  EKG 01/08/17: [place], possible inferior MI Recent Labs: 01/20/17, WBC 7300, sed rate 18 No results found for requested labs within last 8760 hours.    Physical Exam:    VS:  BP 122/64 (BP Location: Left Arm, Patient Position: Sitting, Cuff Size: Normal)   Pulse (!) 56   Ht 4\' 11"  (1.499 m)   Wt 106 lb 1.9 oz (48.1 kg)   SpO2 94%   BMI 21.43 kg/m     Wt Readings from Last 3 Encounters:  02/02/17 106 lb 1.9 oz (48.1 kg)     GEN: Quite frail and chronically ill appearing  in no acute distress HEENT: Normal NECK: No JVD; No carotid bruits LYMPHATICS: No lymphadenopathy CARDIAC: 1/6 systolic ejection murmur aortic area, 1/ 6 apical MR without radiation I could not auscultate aortic regurgitationRRR, no murmurs, rubs, gallops RESPIRATORY:  Clear to auscultation without rales, wheezing or rhonchi  ABDOMEN: Soft, non-tender, non-distended MUSCULOSKELETAL:  No edema; No deformity  SKIN: Warm and dry NEUROLOGIC:  Alert and oriented x 3 PSYCHIATRIC:  Normal affect     Signed, Shirlee More, MD  02/02/2017 3:10 PM    [facility]

## 2017-02-02 NOTE — Patient Instructions (Signed)
Medication Instructions:   Your physician recommends that you continue on your current medications as directed. Please refer to the Current Medication list given to you today.  Labwork:  None  Testing/Procedures: Your physician has requested that you have a lexiscan myoview. For further information please visit [email or web address]. Please follow instruction sheet, as given.  Follow-Up:  Your physician recommends that you schedule a follow-up appointment in: 4 weeks.   Any Other Special Instructions Will Be Listed Below (If Applicable).  If you need a refill on your cardiac medications before your next appointment, please call your pharmacy. 

## 2017-02-03 ENCOUNTER — Telehealth: Payer: Self-pay | Admitting: Cardiology

## 2017-02-03 NOTE — Telephone Encounter (Signed)
FYI, she comes back to see Dr. Bettina Gavia 03/02/17.

## 2017-02-03 NOTE — Telephone Encounter (Signed)
New Message     Pt cancelled myoview until she gets the results of echo and would like to go over the results with her doctor

## 2017-02-05 ENCOUNTER — Encounter (HOSPITAL_COMMUNITY): Payer: Medicare Other

## 2017-02-15 ENCOUNTER — Telehealth (HOSPITAL_COMMUNITY): Payer: Self-pay | Admitting: *Deleted

## 2017-02-15 ENCOUNTER — Telehealth: Payer: Self-pay | Admitting: Cardiology

## 2017-02-15 NOTE — Telephone Encounter (Signed)
Wants to know if it's ok to go have a cap put back on her tooth today @ 3:00

## 2017-02-15 NOTE — Telephone Encounter (Signed)
Please advise 

## 2017-02-15 NOTE — Telephone Encounter (Signed)
Patient advised okay to have cap put back on tooth today. Patient verbalized understanding.

## 2017-02-15 NOTE — Telephone Encounter (Signed)
yes

## 2017-02-15 NOTE — Telephone Encounter (Signed)
Left message on voicemail per DPR in reference to upcoming appointment scheduled on 02/19/17 at 1100 with detailed instructions given per Myocardial Perfusion Study Information Sheet for the test. LM to arrive 15 minutes early, and that it is imperative to arrive on time for appointment to keep from having the test rescheduled. If you need to cancel or reschedule your appointment, please call the office within 24 hours of your appointment. Failure to do so may result in a cancellation of your appointment, and a $50 no show fee. Phone number given for call back for any questions.

## 2017-02-19 ENCOUNTER — Encounter (HOSPITAL_COMMUNITY): Payer: Medicare Other

## 2017-03-02 ENCOUNTER — Ambulatory Visit: Payer: Medicare Other | Admitting: Cardiology

## 2017-04-14 DIAGNOSIS — H60399 Other infective otitis externa, unspecified ear: Secondary | ICD-10-CM | POA: Diagnosis not present

## 2017-04-14 DIAGNOSIS — Z6821 Body mass index (BMI) 21.0-21.9, adult: Secondary | ICD-10-CM | POA: Diagnosis not present

## 2017-04-27 DIAGNOSIS — J302 Other seasonal allergic rhinitis: Secondary | ICD-10-CM | POA: Diagnosis not present

## 2017-04-27 DIAGNOSIS — E559 Vitamin D deficiency, unspecified: Secondary | ICD-10-CM | POA: Diagnosis not present

## 2017-04-27 DIAGNOSIS — Z6821 Body mass index (BMI) 21.0-21.9, adult: Secondary | ICD-10-CM | POA: Diagnosis not present

## 2017-04-27 DIAGNOSIS — D519 Vitamin B12 deficiency anemia, unspecified: Secondary | ICD-10-CM | POA: Diagnosis not present

## 2017-04-27 DIAGNOSIS — H9202 Otalgia, left ear: Secondary | ICD-10-CM | POA: Diagnosis not present

## 2017-04-27 DIAGNOSIS — R5383 Other fatigue: Secondary | ICD-10-CM | POA: Diagnosis not present

## 2017-05-14 DIAGNOSIS — H9202 Otalgia, left ear: Secondary | ICD-10-CM | POA: Diagnosis not present

## 2017-05-20 DIAGNOSIS — H9202 Otalgia, left ear: Secondary | ICD-10-CM | POA: Diagnosis not present

## 2017-05-20 DIAGNOSIS — I700 Atherosclerosis of aorta: Secondary | ICD-10-CM | POA: Diagnosis not present

## 2017-05-25 DIAGNOSIS — H9202 Otalgia, left ear: Secondary | ICD-10-CM | POA: Diagnosis not present

## 2017-05-25 DIAGNOSIS — I7090 Unspecified atherosclerosis: Secondary | ICD-10-CM | POA: Diagnosis not present

## 2017-05-25 DIAGNOSIS — M47819 Spondylosis without myelopathy or radiculopathy, site unspecified: Secondary | ICD-10-CM | POA: Diagnosis not present

## 2017-05-25 DIAGNOSIS — J984 Other disorders of lung: Secondary | ICD-10-CM | POA: Diagnosis not present

## 2017-05-28 DIAGNOSIS — M25511 Pain in right shoulder: Secondary | ICD-10-CM | POA: Diagnosis not present

## 2017-05-28 DIAGNOSIS — Z6821 Body mass index (BMI) 21.0-21.9, adult: Secondary | ICD-10-CM | POA: Diagnosis not present

## 2017-05-28 DIAGNOSIS — H9202 Otalgia, left ear: Secondary | ICD-10-CM | POA: Diagnosis not present

## 2017-05-28 DIAGNOSIS — R8290 Unspecified abnormal findings in urine: Secondary | ICD-10-CM | POA: Diagnosis not present

## 2017-06-10 DIAGNOSIS — M810 Age-related osteoporosis without current pathological fracture: Secondary | ICD-10-CM | POA: Diagnosis not present

## 2017-09-14 ENCOUNTER — Ambulatory Visit: Payer: Medicare Other | Admitting: Diagnostic Neuroimaging

## 2017-10-06 DIAGNOSIS — I10 Essential (primary) hypertension: Secondary | ICD-10-CM | POA: Diagnosis not present

## 2017-10-06 DIAGNOSIS — M810 Age-related osteoporosis without current pathological fracture: Secondary | ICD-10-CM | POA: Diagnosis not present

## 2017-10-06 DIAGNOSIS — K210 Gastro-esophageal reflux disease with esophagitis: Secondary | ICD-10-CM | POA: Diagnosis not present

## 2017-10-12 DIAGNOSIS — Z6821 Body mass index (BMI) 21.0-21.9, adult: Secondary | ICD-10-CM | POA: Diagnosis not present

## 2017-10-12 DIAGNOSIS — E559 Vitamin D deficiency, unspecified: Secondary | ICD-10-CM | POA: Diagnosis not present

## 2017-10-12 DIAGNOSIS — Z0000 Encounter for general adult medical examination without abnormal findings: Secondary | ICD-10-CM | POA: Diagnosis not present

## 2017-10-12 DIAGNOSIS — Z9181 History of falling: Secondary | ICD-10-CM | POA: Diagnosis not present

## 2017-10-12 DIAGNOSIS — Z79899 Other long term (current) drug therapy: Secondary | ICD-10-CM | POA: Diagnosis not present

## 2017-10-12 DIAGNOSIS — Z1331 Encounter for screening for depression: Secondary | ICD-10-CM | POA: Diagnosis not present

## 2017-10-12 DIAGNOSIS — I10 Essential (primary) hypertension: Secondary | ICD-10-CM | POA: Diagnosis not present

## 2017-10-12 DIAGNOSIS — D519 Vitamin B12 deficiency anemia, unspecified: Secondary | ICD-10-CM | POA: Diagnosis not present

## 2017-10-12 DIAGNOSIS — R5383 Other fatigue: Secondary | ICD-10-CM | POA: Diagnosis not present

## 2017-10-25 DIAGNOSIS — M1712 Unilateral primary osteoarthritis, left knee: Secondary | ICD-10-CM | POA: Diagnosis not present

## 2017-11-26 DIAGNOSIS — M25562 Pain in left knee: Secondary | ICD-10-CM | POA: Diagnosis not present

## 2017-12-12 DIAGNOSIS — K219 Gastro-esophageal reflux disease without esophagitis: Secondary | ICD-10-CM | POA: Diagnosis not present

## 2017-12-12 DIAGNOSIS — R0789 Other chest pain: Secondary | ICD-10-CM | POA: Diagnosis not present

## 2017-12-29 DIAGNOSIS — Z23 Encounter for immunization: Secondary | ICD-10-CM | POA: Diagnosis not present

## 2017-12-29 DIAGNOSIS — M810 Age-related osteoporosis without current pathological fracture: Secondary | ICD-10-CM | POA: Diagnosis not present

## 2018-02-01 DIAGNOSIS — R12 Heartburn: Secondary | ICD-10-CM | POA: Diagnosis not present

## 2018-02-10 DIAGNOSIS — M1712 Unilateral primary osteoarthritis, left knee: Secondary | ICD-10-CM | POA: Diagnosis not present

## 2018-02-10 DIAGNOSIS — M25562 Pain in left knee: Secondary | ICD-10-CM | POA: Diagnosis not present

## 2018-03-10 DIAGNOSIS — M1712 Unilateral primary osteoarthritis, left knee: Secondary | ICD-10-CM | POA: Diagnosis not present

## 2018-04-26 DIAGNOSIS — Z6821 Body mass index (BMI) 21.0-21.9, adult: Secondary | ICD-10-CM | POA: Diagnosis not present

## 2018-04-26 DIAGNOSIS — R5381 Other malaise: Secondary | ICD-10-CM | POA: Diagnosis not present

## 2018-04-26 DIAGNOSIS — J302 Other seasonal allergic rhinitis: Secondary | ICD-10-CM | POA: Diagnosis not present

## 2018-04-26 DIAGNOSIS — K219 Gastro-esophageal reflux disease without esophagitis: Secondary | ICD-10-CM | POA: Diagnosis not present

## 2018-04-26 DIAGNOSIS — I10 Essential (primary) hypertension: Secondary | ICD-10-CM | POA: Diagnosis not present

## 2018-04-26 DIAGNOSIS — R5383 Other fatigue: Secondary | ICD-10-CM | POA: Diagnosis not present

## 2018-05-27 ENCOUNTER — Encounter: Payer: Self-pay | Admitting: Gastroenterology

## 2018-06-21 ENCOUNTER — Other Ambulatory Visit (INDEPENDENT_AMBULATORY_CARE_PROVIDER_SITE_OTHER): Payer: Medicare Other

## 2018-06-21 ENCOUNTER — Ambulatory Visit: Payer: Medicare Other | Admitting: Gastroenterology

## 2018-06-21 ENCOUNTER — Ambulatory Visit (INDEPENDENT_AMBULATORY_CARE_PROVIDER_SITE_OTHER): Payer: Medicare Other | Admitting: Gastroenterology

## 2018-06-21 ENCOUNTER — Encounter: Payer: Self-pay | Admitting: Gastroenterology

## 2018-06-21 VITALS — BP 120/72 | HR 55 | Ht 59.0 in | Wt 104.5 lb

## 2018-06-21 DIAGNOSIS — K219 Gastro-esophageal reflux disease without esophagitis: Secondary | ICD-10-CM | POA: Diagnosis not present

## 2018-06-21 DIAGNOSIS — R1013 Epigastric pain: Secondary | ICD-10-CM | POA: Diagnosis not present

## 2018-06-21 MED ORDER — FAMOTIDINE 40 MG PO TABS: 40.0000 mg | ORAL_TABLET | Freq: Two times a day (BID) | ORAL | 11 refills | Status: DC

## 2018-06-21 NOTE — Progress Notes (Signed)
Chief Complaint: Epigastric pain  Referring Provider:  [name], MD      ASSESSMENT AND PLAN;   #1. Epigastric pain. S/p EGD by Dr Melina Copa ? 2010-neg per patient's family.  #2. GERD.   Plan: - Famotidine 40mg  po bid. She can stop taking Protonix (or use it on PRN basis) as she was quite concerned about S/Es esp osteoporosis. - CBC, CMP and lipase today. - [place] abdo complete. - EGD if not better in 2 weeks. - D/w pt and pt's son in detail. He will call us if she is not better. - If still with problems and the above work-up is negative, proceed with CT scan abdo/pelvis.    HPI:    Jodi Jefferson is a 85 y.o. female  Epigastric pain x several months -more or less sharp, nonradiating, gets worse after eating, no definite relieving factors. Getting worse Has burning sensation coming all the way up to the throat No dysphagia  Has been having on ranitidine twice a day with good relief.  Unfortunately, when it was taken off the market, she was switched to Pepcid and then later given Protonix.  She is very much concerned about side effects from Protonix.  Had EGD done by Dr. Melina Copa ?  2010 per patient's son, unremarkable.  We do not have the records.  Has history of chronic constipation, better with prunes.  Had colonoscopy by Dr. Melina Copa several years ago which was unremarkable.  Does not want another colonoscopy ever again.  No significant weight loss.  No jaundice dark urine or pale stools.   Past Medical History:  Diagnosis Date  . Abnormal EKG 01/26/2017  . Anxiety 01/26/2017  . Arthritis 01/26/2017  . Benign essential hypertension 01/26/2017  . Chest pain 01/26/2017  . Closed pelvic fracture ([place]) 01/26/2017  . GERD (gastroesophageal reflux disease) 01/26/2017  . Hyperlipemia, mixed 01/26/2017  . Macular degeneration 01/26/2017  . Osteoporosis 01/26/2017    Past Surgical History:  Procedure Laterality Date  . ABDOMINAL HYSTERECTOMY    . APPENDECTOMY      . COLONOSCOPY     many years ago   . ESOPHAGOGASTRODUODENOSCOPY  2010  . REFRACTIVE SURGERY    . TONSILLECTOMY      Family History  Problem Relation Age of Onset  . Heart attack Father   . Prostate cancer Paternal Grandfather     Social History   Tobacco Use  . Smoking status: Never Smoker  . Smokeless tobacco: Never Used  Substance Use Topics  . Alcohol use: No  . Drug use: No    Current Outpatient Medications  Medication Sig Dispense Refill  . acetaminophen (ACETAMINOPHEN 8 HOUR) 650 MG CR tablet Take 650 mg by mouth every morning.    Marland Kitchen alum & mag hydroxide-simeth (MAALOX/MYLANTA) 200-200-20 MG/5ML suspension Take by mouth every 6 (six) hours as needed for indigestion or heartburn.    . calcium citrate-vitamin D (CITRACAL+D) 315-200 MG-UNIT tablet Take by mouth.    . Capsaicin (CAPZASIN-HP) 0.1 % CREA Apply topically every morning.    . Cholecalciferol (VITAMIN D-1000 MAX ST) 1000 units tablet Take by mouth.    . diclofenac sodium (VOLTAREN) 1 % GEL Apply topically every morning.    . famotidine (PEPCID) 20 MG tablet Take 40 mg by mouth at bedtime.    Marland Kitchen glucosamine-chondroitin 500-400 MG tablet Take by mouth.    Marland Kitchen lisinopril (PRINIVIL,ZESTRIL) 10 MG tablet 5 mg daily.     . pantoprazole (PROTONIX) 40 MG  tablet Take 40 mg by mouth daily.    Vladimir Faster Glycol-Propyl Glycol (SYSTANE OP) Apply to eye.    Marland Kitchen Specialty Vitamins Products (ICAPS LUTEIN-ZEAXANTHIN PO) Take by mouth daily.     No current facility-administered medications for this visit.     Allergies  Allergen Reactions  . Aspirin   . Macrobid [Nitrofurantoin Macrocrystal]   . Polyethylene Glycol     Facial flushing and tingling  . Prednisone   . Red Yeast Rice [Cholestin]   . Sulfa Antibiotics     Review of Systems:  Constitutional: Denies fever, chills, diaphoresis, appetite change and has fatigue.  HEENT: Denies photophobia, eye pain, redness, hearing loss, ear pain, congestion, sore throat,  rhinorrhea, sneezing, mouth sores, neck pain, neck stiffness and tinnitus.   Respiratory: Denies SOB, DOE, cough, chest tightness,  and wheezing.   Cardiovascular: Denies chest pain, palpitations and leg swelling.  Genitourinary: Denies dysuria, urgency, frequency, hematuria, flank pain and difficulty urinating.  Musculoskeletal: Has myalgias, back pain, joint swelling, arthralgias and gait problem.  Skin: No rash.  Neurological: Denies dizziness, seizures, syncope, weakness, light-headedness, numbness and headaches.  Hematological: Denies adenopathy. Easy bruising, personal or family bleeding history  Psychiatric/Behavioral: Has anxiety or depression     Physical Exam:    BP 120/72   Pulse (!) 55   Ht 4\' 11"  (1.499 m)   Wt 104 lb 8 oz (47.4 kg)   SpO2 95%   BMI 21.11 kg/m  Filed Weights   06/21/18 1434  Weight: 104 lb 8 oz (47.4 kg)   Constitutional:  Well-developed, in no acute distress. Psychiatric: Normal mood and affect. Behavior is normal. HEENT: Pupils normal.  Conjunctivae are normal. No scleral icterus. Cardiovascular: Normal rate, regular rhythm. No edema Pulmonary/chest: Effort normal and breath sounds normal. No wheezing, rales or rhonchi. Abdominal: Soft, nondistended. Nontender. Bowel sounds active throughout. There are no masses palpable. No hepatomegaly. Rectal:  defered Neurological: Alert and oriented to person place and time. Skin: Skin is warm and dry. No rashes noted.    [name], MD 06/21/2018, 2:43 PM  Cc: [name], MD

## 2018-06-21 NOTE — Patient Instructions (Addendum)
If you are age 83 or older, your body mass index should be between 23-30. Your Body mass index is 21.11 kg/m. If this is out of the aforementioned range listed, please consider follow up with your Primary Care Provider.  If you are age 2 or younger, your body mass index should be between 19-25. Your Body mass index is 21.11 kg/m. If this is out of the aformentioned range listed, please consider follow up with your Primary Care Provider.   You have been scheduled for an abdominal ultrasound at  [facility] (1st floor ) on 06/29/18 at 9am. Please arrive 15 minutes prior to your appointment for registration. Make certain not to have anything to eat or drink 6 hours prior to your appointment. Should you need to reschedule your appointment, please contact radiology at [phone]. This test typically takes about 30 minutes to perform.   Please go to the lab on the 2nd floor [address] before you leave the office today.   We have sent the following medications to your pharmacy for you to pick up at your convenience: Famotidinie 40 mg   Thank you,  Dr. Jackquline Denmark

## 2018-06-22 LAB — COMPREHENSIVE METABOLIC PANEL WITH GFR
ALT: 18 U/L (ref 0–35)
AST: 19 U/L (ref 0–37)
Albumin: 4.4 g/dL (ref 3.5–5.2)
Alkaline Phosphatase: 58 U/L (ref 39–117)
BUN: 22 mg/dL (ref 6–23)
CO2: 32 meq/L (ref 19–32)
Calcium: 10.7 mg/dL — ABNORMAL HIGH (ref 8.4–10.5)
Chloride: 101 meq/L (ref 96–112)
Creatinine, Ser: 0.46 mg/dL (ref 0.40–1.20)
GFR: 129.24 mL/min
Glucose, Bld: 79 mg/dL (ref 70–99)
Potassium: 5.1 meq/L (ref 3.5–5.1)
Sodium: 139 meq/L (ref 135–145)
Total Bilirubin: 0.3 mg/dL (ref 0.2–1.2)
Total Protein: 7.5 g/dL (ref 6.0–8.3)

## 2018-06-22 LAB — CBC WITH DIFFERENTIAL/PLATELET
Basophils Absolute: 0.1 K/uL (ref 0.0–0.1)
Basophils Relative: 1 % (ref 0.0–3.0)
Eosinophils Absolute: 0 K/uL (ref 0.0–0.7)
Eosinophils Relative: 0.6 % (ref 0.0–5.0)
HCT: 39.4 % (ref 36.0–46.0)
Hemoglobin: 13.1 g/dL (ref 12.0–15.0)
Lymphocytes Relative: 21.7 % (ref 12.0–46.0)
Lymphs Abs: 1.8 K/uL (ref 0.7–4.0)
MCHC: 33.4 g/dL (ref 30.0–36.0)
MCV: 95.3 fl (ref 78.0–100.0)
Monocytes Absolute: 0.8 K/uL (ref 0.1–1.0)
Monocytes Relative: 9.7 % (ref 3.0–12.0)
Neutro Abs: 5.6 K/uL (ref 1.4–7.7)
Neutrophils Relative %: 67 % (ref 43.0–77.0)
Platelets: 292 K/uL (ref 150.0–400.0)
RBC: 4.13 Mil/uL (ref 3.87–5.11)
RDW: 13.3 % (ref 11.5–15.5)
WBC: 8.3 K/uL (ref 4.0–10.5)

## 2018-06-22 LAB — LIPASE: Lipase: 19 U/L (ref 11.0–59.0)

## 2018-06-29 ENCOUNTER — Other Ambulatory Visit (HOSPITAL_BASED_OUTPATIENT_CLINIC_OR_DEPARTMENT_OTHER): Payer: Medicare Other

## 2018-06-29 ENCOUNTER — Ambulatory Visit (HOSPITAL_BASED_OUTPATIENT_CLINIC_OR_DEPARTMENT_OTHER)
Admission: RE | Admit: 2018-06-29 | Discharge: 2018-06-29 | Disposition: A | Discharge status: Home / Self Care | Payer: Medicare Other | Source: Ambulatory Visit | Attending: Gastroenterology | Admitting: Gastroenterology

## 2018-06-29 ENCOUNTER — Other Ambulatory Visit: Payer: Self-pay

## 2018-06-29 DIAGNOSIS — R1013 Epigastric pain: Secondary | ICD-10-CM | POA: Insufficient documentation

## 2018-06-30 ENCOUNTER — Telehealth: Payer: Self-pay | Admitting: Gastroenterology

## 2018-06-30 NOTE — Telephone Encounter (Signed)
The pt was advised we will call as soon as resulted.  She asked that we leave detailed information on her voice mail.

## 2018-07-05 ENCOUNTER — Telehealth: Payer: Self-pay | Admitting: Gastroenterology

## 2018-07-05 NOTE — Telephone Encounter (Signed)
Pt requested a CB to discuss results of abd ultrasound.

## 2018-07-06 NOTE — Telephone Encounter (Signed)
See US results for additional details 

## 2018-07-08 NOTE — Telephone Encounter (Signed)
Jodi Denmark, MD  Mohammed Kindle, RN        Please inform the patient.  Ultrasound showing liver lesion (likely benign). Be on the safer side, neurology recommends triphasic CT abdomen. Please schedule for that.  Send report to family physician

## 2018-07-08 NOTE — Telephone Encounter (Signed)
Pt called again regarding [place] results.

## 2018-07-20 NOTE — Telephone Encounter (Signed)
Lets proceed with triphasic CT Abdo/pelvis as recommended by radiology Still having abdominal pain. Also please work her in for EGD at my next [place] schedule. RE: epigastric pain Please have John review so that she meets the [place] criteria

## 2018-09-08 ENCOUNTER — Telehealth: Payer: Self-pay | Admitting: Gastroenterology

## 2018-09-08 NOTE — Telephone Encounter (Signed)
Pt son called and wanted to speak with the nurse about scheduling his mother an MRI.

## 2018-09-08 NOTE — Telephone Encounter (Signed)
Left message for patient to call back  

## 2018-09-13 DIAGNOSIS — M1712 Unilateral primary osteoarthritis, left knee: Secondary | ICD-10-CM | POA: Diagnosis not present

## 2018-09-14 NOTE — Telephone Encounter (Signed)
Left message for patient to call back  

## 2018-09-16 NOTE — Telephone Encounter (Signed)
No return calls at this time. Will await a return call from the patient/family

## 2018-09-26 DIAGNOSIS — M810 Age-related osteoporosis without current pathological fracture: Secondary | ICD-10-CM | POA: Diagnosis not present

## 2018-10-11 DIAGNOSIS — K219 Gastro-esophageal reflux disease without esophagitis: Secondary | ICD-10-CM | POA: Diagnosis not present

## 2018-10-11 DIAGNOSIS — B351 Tinea unguium: Secondary | ICD-10-CM | POA: Diagnosis not present

## 2018-10-11 DIAGNOSIS — E559 Vitamin D deficiency, unspecified: Secondary | ICD-10-CM | POA: Diagnosis not present

## 2018-10-11 DIAGNOSIS — Z6821 Body mass index (BMI) 21.0-21.9, adult: Secondary | ICD-10-CM | POA: Diagnosis not present

## 2018-10-11 DIAGNOSIS — M1990 Unspecified osteoarthritis, unspecified site: Secondary | ICD-10-CM | POA: Diagnosis not present

## 2018-10-11 DIAGNOSIS — R5383 Other fatigue: Secondary | ICD-10-CM | POA: Diagnosis not present

## 2018-10-11 DIAGNOSIS — Z79899 Other long term (current) drug therapy: Secondary | ICD-10-CM | POA: Diagnosis not present

## 2018-10-11 NOTE — Telephone Encounter (Signed)
Hi Jodi Jefferson,   Pl see my note dated 4/1. I think you had tried to get in touch with the pt and family. I got another message this AM   Have copied my prev note:  Lets proceed with triphasic CT Abdo/pelvis as recommended by radiology Still having abdominal pain. Also please work her in for EGD at my next [place] schedule. RE: epigastric pain Please have John review so that she meets the [place] criteria  Thx  RG

## 2018-10-13 DIAGNOSIS — E538 Deficiency of other specified B group vitamins: Secondary | ICD-10-CM | POA: Diagnosis not present

## 2018-10-17 ENCOUNTER — Telehealth: Payer: Self-pay

## 2018-10-17 NOTE — Telephone Encounter (Signed)
Jackquline Denmark, MD at 10/11/2018 8:55 PM  Status: Signed    Hi Sherri,   Pl see my note dated 4/1. I think you had tried to get in touch with the pt and family. I got another message this AM   Have copied my prev note:  Lets proceed with triphasic CT Abdo/pelvis as recommended by radiology Still having abdominal pain. Also please work her in for EGD at my next [place] schedule. VP:[id number] pain Please have John review so that she meets the [place] criteria  Thx  RG     Left message for patient to call back

## 2018-10-19 NOTE — Telephone Encounter (Signed)
Left message for patient to call back  

## 2018-10-24 NOTE — Telephone Encounter (Signed)
Please review this patient's chart and advise if the patient can have and EGD in the [place] per Dr. Lyndel Safe; please route response to Dr. Steve Rattler RN [facility]) so procedure can then be scheduled at next available appt;

## 2018-10-24 NOTE — Telephone Encounter (Signed)
Brianna, ° °This pt is cleared for anesthetic care at [facility]. ° °Thanks, ° °Joshuah Minella °

## 2018-10-25 DIAGNOSIS — M1712 Unilateral primary osteoarthritis, left knee: Secondary | ICD-10-CM | POA: Diagnosis not present

## 2018-10-26 ENCOUNTER — Telehealth: Payer: Self-pay

## 2018-10-26 DIAGNOSIS — R1013 Epigastric pain: Secondary | ICD-10-CM

## 2018-10-26 DIAGNOSIS — K219 Gastro-esophageal reflux disease without esophagitis: Secondary | ICD-10-CM

## 2018-10-26 NOTE — Telephone Encounter (Signed)
As we talked over the phone today  - Triphasic CT abdo - EGD - let me know.  Thx RG

## 2018-10-26 NOTE — Telephone Encounter (Signed)
Left message for patient to call back to office ?

## 2018-10-26 NOTE — Telephone Encounter (Signed)
Left message for patient to call back to the office at [phone];

## 2018-10-27 NOTE — Telephone Encounter (Signed)
Left message for patient to call back to the office; patient has been scheduled for the priphasic CT abd/pel on 11/01/2018 arrive at 10:00 am; NPO after 6:00am; 1st contrast bottle at 8:00am and 2nd at 9:00am;  Labs need to be done prior to CT scan  Pre visit has been scheduled on 10/28/2018 at 10:15am; EGD has been scheduled on 11/08/2018 at 11:00 am.  Will notify patient's family member of scheduled test when they return call to the office;

## 2018-10-27 NOTE — Telephone Encounter (Signed)
Left message for patient to call back  

## 2018-10-28 ENCOUNTER — Other Ambulatory Visit: Payer: Self-pay

## 2018-10-28 NOTE — Telephone Encounter (Signed)
Patient's son, Jodi Jefferson, verified DPR, Jodi Jefferson reports the patient is feeling much better and does not want to complete requested lab work, CT scan, pre visit, or EGD at this time; RN explained that cancelling these requested tests/appts can be done but may cause a delay in future care as appts are filling up quickly and may not be readily available should the patient's symptoms return; patient (got on the phone at this point) and verbalized she is no longer having symptoms at this time and she is "going to take my medicine just like Dr. Lyndel Safe told me to because I have not been taking it twice a day"; patient also verbalized understanding of cancelling the appts may cause a delay in future care as the appts may not be available; the patient's appt for CT, labs, pre visit, and EGD have all been cancelled at patient/son's request; patient/son were instructed to call back to the office should symptoms return or questions/concerns arise; Patient/son verbalized understanding of information/instructions;

## 2018-10-28 NOTE — Telephone Encounter (Signed)
Bre, Thanks for letting me know I am glad she is feeling better. We will always be here for her.  RG

## 2018-10-28 NOTE — Telephone Encounter (Signed)
Please review for info

## 2018-11-01 ENCOUNTER — Other Ambulatory Visit (HOSPITAL_BASED_OUTPATIENT_CLINIC_OR_DEPARTMENT_OTHER): Payer: Medicare Other

## 2018-11-08 ENCOUNTER — Encounter: Payer: Medicare Other | Admitting: Gastroenterology

## 2018-11-17 ENCOUNTER — Other Ambulatory Visit: Payer: Self-pay

## 2018-11-29 DIAGNOSIS — Z0000 Encounter for general adult medical examination without abnormal findings: Secondary | ICD-10-CM | POA: Diagnosis not present

## 2018-11-29 DIAGNOSIS — R600 Localized edema: Secondary | ICD-10-CM | POA: Diagnosis not present

## 2018-11-29 DIAGNOSIS — D510 Vitamin B12 deficiency anemia due to intrinsic factor deficiency: Secondary | ICD-10-CM | POA: Diagnosis not present

## 2018-11-29 DIAGNOSIS — R109 Unspecified abdominal pain: Secondary | ICD-10-CM | POA: Diagnosis not present

## 2018-11-29 DIAGNOSIS — Z6821 Body mass index (BMI) 21.0-21.9, adult: Secondary | ICD-10-CM | POA: Diagnosis not present

## 2018-11-30 DIAGNOSIS — D519 Vitamin B12 deficiency anemia, unspecified: Secondary | ICD-10-CM | POA: Diagnosis not present

## 2018-12-01 ENCOUNTER — Telehealth: Payer: Self-pay | Admitting: Gastroenterology

## 2018-12-01 DIAGNOSIS — K219 Gastro-esophageal reflux disease without esophagitis: Secondary | ICD-10-CM

## 2018-12-01 DIAGNOSIS — R1013 Epigastric pain: Secondary | ICD-10-CM

## 2018-12-02 NOTE — Telephone Encounter (Signed)
Patient returned phone call. °

## 2018-12-02 NOTE — Telephone Encounter (Signed)
Called and spoke with patient-patient reports her throat is not hurting much anymore; patient also reports that the abdominal pain has subsided since she called; patient advised to call back to the office should questions/concerns arise or symptoms return/worsen; patient verbalized understanding of information/instructions;   FYI Any advice for the patient?

## 2018-12-02 NOTE — Telephone Encounter (Signed)
Left message for patient to call back  

## 2018-12-05 NOTE — Telephone Encounter (Signed)
Wants to schedule an appt for Dr Lyndel Safe to see in her stomach with the light and the scope. Please call back with the appointment. Declined offfice visit. Spoke to son Lucianne Lei.

## 2018-12-06 NOTE — Telephone Encounter (Signed)
See previous note -Proceed with triphasic CT as suggested by radiology. -Thereafter, proceed with EGD.  Thx  RG

## 2018-12-06 NOTE — Telephone Encounter (Signed)
Please review patient message Is this appropriate for this patient to be scheduled for an EGD/colon or just EGD or just colon? Patient's son is the one who called and requested this procedure Please advise as I need to try to reach the patient's son as close to 5 pm as possible to inform him of this information

## 2018-12-06 NOTE — Telephone Encounter (Signed)
Called and spoke with patient's-Van-verified DPR-patient's son is wanting the patient to have the EGD; after verbal order from Dr. Lyndel Safe was given , it was explained to the son that the next step in the patient's plan of care is to have lab work completed prior to the CT scan and then depending on the CT results -EGD may be requested; patient's son verbalized understanding of information and was advised to call back to the office should questions/concerns arise;   CT scan needs to be scheduled -as order is already in Epic; lab work required prior to CT scan has been placed in Epic as well;

## 2018-12-06 NOTE — Telephone Encounter (Signed)
Patient son walked into the [place] office wanting to know if we have heard from the dr regarding this. Best # [phone]

## 2018-12-07 NOTE — Telephone Encounter (Signed)
Left message for patient's son to return a call to the office; also sending a MyChart message with information concerning the patient and the recommended tests by Dr. Lyndel Safe   Dr. Lyndel Safe - the patient's son understands that the lab work then the CT and then the EGD-however, the patient is currently scheduled for her EGD on 12/15/2018 at 1:30 pm;  ?Do I need to cancel this procedure or keep her on your schedule and wait for the CT results? Please advise

## 2018-12-07 NOTE — Telephone Encounter (Signed)
Just keep her on the schedule for EGD. I think we can get CT and labs done before RG

## 2018-12-08 NOTE — Telephone Encounter (Signed)
Message sent to patient's son via MyChart-patient has requested lab work and CT be done at [facility]-orders/request faxed to [facility] patient's son was advised to call [facility] to schedule this scan; RN will follow up with patient's son; patient is also scheduled for EGD on 12/15/2018 with Dr. Lyndel Safe at [facility];

## 2018-12-09 ENCOUNTER — Telehealth: Payer: Self-pay | Admitting: Gastroenterology

## 2018-12-09 ENCOUNTER — Encounter: Payer: Self-pay | Admitting: Gastroenterology

## 2018-12-09 DIAGNOSIS — R1013 Epigastric pain: Secondary | ICD-10-CM | POA: Diagnosis not present

## 2018-12-09 DIAGNOSIS — K219 Gastro-esophageal reflux disease without esophagitis: Secondary | ICD-10-CM | POA: Diagnosis not present

## 2018-12-09 NOTE — Telephone Encounter (Signed)
Patient's son-Jodi Jefferson- returned call to the [place] wanted to make sure the office knew he received the MyChart message and that he would call and get his mom's CT scan and lab work done as soon as possible at [facility] so that Dr. Lyndel Safe can review the results; patient's son was advised to call back to the office should questions/concerns arise; Lucianne Lei verbalized under standing of information/instructions;

## 2018-12-09 NOTE — Telephone Encounter (Signed)
Lucianne Lei (patient's son) has already been notified concerning this message

## 2018-12-09 NOTE — Telephone Encounter (Signed)
Call and spoke with Centralized Scheduling at [facility] has already been scheduled for CT scan on 12/13/2018; and CT was not ordered STAT by Dr. Lyndel Safe; date/time not changed by RN at this time;  RN contacted patient's son-Van-informed Lucianne Lei that the appt for hte CT has already been scheduled (by him) on Tuesday and that the lab work has to be done before his mom can have the CT scan; Lucianne Lei reports he is off on Wednesday and would like to have the CT on Wednesday-RN advised Lucianne Lei that he would need to call [facility] to reschedule that appt; Lucianne Lei verbalized understanding of information; Lucianne Lei was also advised to call back to the office should questions/concerns arise;

## 2018-12-13 ENCOUNTER — Encounter: Payer: Self-pay | Admitting: Gastroenterology

## 2018-12-13 DIAGNOSIS — R1013 Epigastric pain: Secondary | ICD-10-CM | POA: Diagnosis not present

## 2018-12-13 DIAGNOSIS — K219 Gastro-esophageal reflux disease without esophagitis: Secondary | ICD-10-CM | POA: Diagnosis not present

## 2018-12-14 ENCOUNTER — Telehealth: Payer: Self-pay | Admitting: *Deleted

## 2018-12-14 NOTE — Telephone Encounter (Signed)
Covid-19 screening questions   Do you now or have you had a fever in the last 14 days?no  Do you have any respiratory symptoms of shortness of breath or cough now or in the last 14 days?no  Do you have any family members or close contacts with diagnosed or suspected Covid-19 in the past 14 days?no  Have you been tested for Covid-19 and found to be positive?no  Pt made aware of care partner policy.       

## 2018-12-15 ENCOUNTER — Other Ambulatory Visit: Payer: Self-pay

## 2018-12-15 ENCOUNTER — Encounter: Payer: Self-pay | Admitting: Gastroenterology

## 2018-12-15 ENCOUNTER — Ambulatory Visit (AMBULATORY_SURGERY_CENTER): Payer: Medicare Other | Admitting: Gastroenterology

## 2018-12-15 VITALS — BP 145/73 | HR 53 | Temp 98.4°F | Resp 18 | Ht 59.0 in | Wt 104.0 lb

## 2018-12-15 DIAGNOSIS — R1013 Epigastric pain: Secondary | ICD-10-CM | POA: Diagnosis not present

## 2018-12-15 DIAGNOSIS — K259 Gastric ulcer, unspecified as acute or chronic, without hemorrhage or perforation: Secondary | ICD-10-CM | POA: Diagnosis not present

## 2018-12-15 DIAGNOSIS — K3189 Other diseases of stomach and duodenum: Secondary | ICD-10-CM | POA: Diagnosis not present

## 2018-12-15 MED ORDER — RABEPRAZOLE SODIUM 20 MG PO TBEC: 20.0000 mg | DELAYED_RELEASE_TABLET | Freq: Every day | ORAL | 0 refills | Status: DC

## 2018-12-15 MED ORDER — SODIUM CHLORIDE 0.9 % IV SOLN: 500.0000 mL | Freq: Once | INTRAVENOUS | Status: DC

## 2018-12-15 NOTE — Op Note (Signed)
[facility] Patient Name: Jodi Jefferson Procedure Date: 12/15/2018 12:58 PM MRN: [id number] Endoscopist: Jackquline Denmark , MD Age: 85 Referring MD:  Date of Birth: 1931/09/26 Gender: Female Account #: [id number] Procedure:                Upper GI endoscopy Indications:              Epigastric abdominal pain Medicines:                Monitored Anesthesia Care Procedure:                Pre-Anesthesia Assessment:                           - Prior to the procedure, a History and Physical                            was performed, and patient medications and                            allergies were reviewed. The patient's tolerance of                            previous anesthesia was also reviewed. The risks                            and benefits of the procedure and the sedation                            options and risks were discussed with the patient.                            All questions were answered, and informed consent                            was obtained. Prior Anticoagulants: The patient has                            taken no previous anticoagulant or antiplatelet                            agents. ASA Grade Assessment: II - A patient with                            mild systemic disease. After reviewing the risks                            and benefits, the patient was deemed in                            satisfactory condition to undergo the procedure.                           After obtaining informed consent, the endoscope was  passed under direct vision. Throughout the                            procedure, the patient's blood pressure, pulse, and                            oxygen saturations were monitored continuously. The                            Endoscope was introduced through the mouth, and                            advanced to the second part of duodenum. The upper                            GI endoscopy was accomplished  without difficulty.                            The patient tolerated the procedure well. Scope In: Scope Out: Findings:                 The examined esophagus was normal.                           The Z-line was regular and was found 32 cm from the                            incisors.                           A small hiatal hernia was present.                           One non-bleeding cratered gastric ulcer with no                            stigmata of bleeding, but with a small eschar was                            found in the gastric antrum. The lesion was 10 mm                            in largest dimension. Biopsies were taken with a                            cold forceps for histology. Estimated blood loss:                            none.                           The examined duodenum was normal. Biopsies for                            histology were taken with  a cold forceps for                            evaluation of celiac disease. Estimated blood loss:                            none. Complications:            No immediate complications. Estimated Blood Loss:     Estimated blood loss: none. Impression:               -Gastric ulcer                           -Small hiatal hernia. Recommendation:           - Patient has a contact number available for                            emergencies. The signs and symptoms of potential                            delayed complications were discussed with the                            patient. Return to normal activities tomorrow.                            Written discharge instructions were provided to the                            patient.                           - Resume previous diet.                           - Follow biopsies.                           - Use Aciphex (rabeprazole) 20 mg PO QD for 12                            weeks.                           - Repeat EGD in 12 weeks to ensure healing of the                             ulcers.                           - Avoid nonsteroidals. Jackquline Denmark, MD 12/15/2018 1:34:43 PM This report has been signed electronically.

## 2018-12-15 NOTE — Patient Instructions (Addendum)
Handouts given for Hiatal Hernia and Peptic ulcer disease.  You need to pick up your new stomach medicine at your pharmacy.  You need to stop taking all of your other stomach medicines (Pepcid 40 mg, Protonix 40 mg, and Maalox/Mylanta).  You will need another Endoscopy procedure in 12 weeks, Dr. Steve Rattler office will arrange.  Avoid NSAIDS (Aspirin, Aleve, Ibuprofen, Naproxen).  YOU HAD AN ENDOSCOPIC PROCEDURE TODAY AT [facility]:   Refer to the procedure report that was given to you for any specific questions about what was found during the examination.  If the procedure report does not answer your questions, please call your gastroenterologist to clarify.  If you requested that your care partner not be given the details of your procedure findings, then the procedure report has been included in a sealed envelope for you to review at your convenience later.  YOU SHOULD EXPECT: Some feelings of bloating in the abdomen. Passage of more gas than usual.  Walking can help get rid of the air that was put into your GI tract during the procedure and reduce the bloating. If you had a lower endoscopy (such as a colonoscopy or flexible sigmoidoscopy) you may notice spotting of blood in your stool or on the toilet paper. If you underwent a bowel prep for your procedure, you may not have a normal bowel movement for a few days.  Please Note:  You might notice some irritation and congestion in your nose or some drainage.  This is from the oxygen used during your procedure.  There is no need for concern and it should clear up in a day or so.  SYMPTOMS TO REPORT IMMEDIATELY:   Following upper endoscopy (EGD)  Vomiting of blood or coffee ground material  New chest pain or pain under the shoulder blades  Painful or persistently difficult swallowing  New shortness of breath  Fever of 100F or higher  Black, tarry-looking stools  For urgent or emergent issues, a gastroenterologist can be  reached at any hour by calling [phone].   DIET:  We do recommend a small meal at first, but then you may proceed to your regular diet.  Drink plenty of fluids but you should avoid alcoholic beverages for 24 hours.  ACTIVITY:  You should plan to take it easy for the rest of today and you should NOT DRIVE or use heavy machinery until tomorrow (because of the sedation medicines used during the test).    FOLLOW UP: Our staff will call the number listed on your records 48-72 hours following your procedure to check on you and address any questions or concerns that you may have regarding the information given to you following your procedure. If we do not reach you, we will leave a message.  We will attempt to reach you two times.  During this call, we will ask if you have developed any symptoms of COVID 19. If you develop any symptoms (ie: fever, flu-like symptoms, shortness of breath, cough etc.) before then, please call [phone].  If you test positive for Covid 19 in the 2 weeks post procedure, please call and report this information to [place].    If any biopsies were taken you will be contacted by phone or by letter within the next 1-3 weeks.  Please call us at [phone] if you have not heard about the biopsies in 3 weeks.    SIGNATURES/CONFIDENTIALITY: You and/or your care partner have signed paperwork which will be entered into your  electronic medical record.  These signatures attest to the fact that that the information above on your After Visit Summary has been reviewed and is understood.  Full responsibility of the confidentiality of this discharge information lies with you and/or your care-partner.

## 2018-12-15 NOTE — Progress Notes (Signed)
Temperature taken by J.B., VS taken by N.C. 

## 2018-12-15 NOTE — Progress Notes (Signed)
PT taken to PACU. Monitors in place. VSS. Report given to RN. 

## 2018-12-19 ENCOUNTER — Telehealth: Payer: Self-pay

## 2018-12-19 ENCOUNTER — Telehealth: Payer: Self-pay | Admitting: *Deleted

## 2018-12-19 NOTE — Telephone Encounter (Signed)
  Follow up Call-  Call back number 12/15/2018  Post procedure Call Back phone  # [phone]  Permission to leave phone message Yes  Some recent data might be hidden     Patient questions:  Message left to all [place] if necessary.  Second call.

## 2018-12-19 NOTE — Telephone Encounter (Signed)
First attempt follow up call after pt procedure on Thursday, left message for pt, no need to call us unless there is a problem, otherwise we will try back after noon today.

## 2018-12-21 ENCOUNTER — Encounter: Payer: Self-pay | Admitting: Gastroenterology

## 2018-12-29 DIAGNOSIS — M25552 Pain in left hip: Secondary | ICD-10-CM | POA: Diagnosis not present

## 2018-12-29 DIAGNOSIS — M1712 Unilateral primary osteoarthritis, left knee: Secondary | ICD-10-CM | POA: Diagnosis not present

## 2018-12-29 DIAGNOSIS — M1612 Unilateral primary osteoarthritis, left hip: Secondary | ICD-10-CM | POA: Diagnosis not present

## 2019-01-02 DIAGNOSIS — L603 Nail dystrophy: Secondary | ICD-10-CM

## 2019-01-02 DIAGNOSIS — M7989 Other specified soft tissue disorders: Secondary | ICD-10-CM

## 2019-01-02 HISTORY — DX: Nail dystrophy: L60.3

## 2019-01-02 HISTORY — DX: Other specified soft tissue disorders: M79.89

## 2019-01-20 DIAGNOSIS — Z23 Encounter for immunization: Secondary | ICD-10-CM | POA: Diagnosis not present

## 2019-01-20 DIAGNOSIS — E538 Deficiency of other specified B group vitamins: Secondary | ICD-10-CM | POA: Diagnosis not present

## 2019-01-26 ENCOUNTER — Telehealth: Payer: Self-pay | Admitting: Gastroenterology

## 2019-01-26 NOTE — Telephone Encounter (Signed)
Called and spoke with patient's son (patient in the room)- Aciphex is not really working very well, symptoms have gotten worse-"Aciphex was helping but now it is not really helping"  Please advise

## 2019-01-26 NOTE — Telephone Encounter (Signed)
Pt states that miralax is not helping much, she is still not feeling well. Pt would like some advise.

## 2019-01-26 NOTE — Telephone Encounter (Signed)
Does this patient need to be taking Miralax every day to keep her bowel movements regular? (patient request) Please advise

## 2019-01-26 NOTE — Telephone Encounter (Signed)
Patient called back and she said that she has not been taking her Miralax. She stated she was not aware to take that and her son said that she needs to take it. So she had said that she thinks she should try that first but she is not sure how much to take an would like a call back about that.

## 2019-01-29 NOTE — Telephone Encounter (Signed)
Yes please She is to take MiraLAX every day for now. Can do it every other day if she starts having diarrhea.  Thx  RG

## 2019-01-30 NOTE — Telephone Encounter (Signed)
Information sent to patient via MyChart as RN was unable to reach patient on phone;

## 2019-02-27 ENCOUNTER — Other Ambulatory Visit: Payer: Self-pay | Admitting: Gastroenterology

## 2019-02-27 DIAGNOSIS — R1013 Epigastric pain: Secondary | ICD-10-CM

## 2019-03-08 DIAGNOSIS — E538 Deficiency of other specified B group vitamins: Secondary | ICD-10-CM | POA: Diagnosis not present

## 2019-04-10 ENCOUNTER — Encounter: Payer: Self-pay | Admitting: Gastroenterology

## 2019-05-22 ENCOUNTER — Other Ambulatory Visit: Payer: Self-pay | Admitting: Gastroenterology

## 2019-05-22 DIAGNOSIS — R1013 Epigastric pain: Secondary | ICD-10-CM

## 2019-05-22 NOTE — Telephone Encounter (Signed)
Pt's son requested a refill for rabeprazole sent to [facility] in [place].

## 2019-05-31 DIAGNOSIS — M1712 Unilateral primary osteoarthritis, left knee: Secondary | ICD-10-CM | POA: Diagnosis not present

## 2019-06-01 ENCOUNTER — Telehealth (INDEPENDENT_AMBULATORY_CARE_PROVIDER_SITE_OTHER): Payer: Medicare Other | Admitting: Gastroenterology

## 2019-06-01 ENCOUNTER — Encounter: Payer: Self-pay | Admitting: Gastroenterology

## 2019-06-01 ENCOUNTER — Other Ambulatory Visit: Payer: Self-pay

## 2019-06-01 VITALS — Ht 59.0 in | Wt 109.0 lb

## 2019-06-01 DIAGNOSIS — R1013 Epigastric pain: Secondary | ICD-10-CM

## 2019-06-01 NOTE — Progress Notes (Signed)
Chief Complaint: FU  Referring Provider:  [name], MD      ASSESSMENT AND PLAN;   #1. Epigastric pain d/t GU (resolved) EGD 11/2018  #2. GERD with small HH.   Plan: -Continue AcipHex 20 mg p.o. once a day, #90, 4 refills.  Can always try to take it every other day if it works as good. - D/w pt and pt's son in detail.  They will call us if there is any further GI problems.    HPI:    Jodi Jefferson is a 86 y.o. female  For follow-up visit I talked to her son as well  She feels 100% better  No further abdominal pain.  Tolerating AcipHex 20 mg p.o. once a day very well.  Would like to hold off on repeat EGD as she feels better.  She underwent ultrasound followed by CT Abdo/pelvis (see report in radiology section)-no acute abnormalities.   Pleased with the progress.  Has history of chronic constipation, better with prunes.  Had colonoscopy by Dr. Melina Copa several years ago which was unremarkable.  Does not want another colonoscopy ever again.  No weight loss.  No jaundice dark urine or pale stools.   Past Medical History:  Diagnosis Date  . Abnormal EKG 01/26/2017  . Allergy   . Anxiety 01/26/2017  . Arthritis 01/26/2017  . Benign essential hypertension 01/26/2017  . Cataract   . Chest pain 01/26/2017  . Closed pelvic fracture ([place]) 01/26/2017  . GERD (gastroesophageal reflux disease) 01/26/2017  . Hyperlipemia, mixed 01/26/2017  . Macular degeneration 01/26/2017  . Osteoporosis 01/26/2017    Past Surgical History:  Procedure Laterality Date  . ABDOMINAL HYSTERECTOMY    . APPENDECTOMY    . COLONOSCOPY     many years ago   . ESOPHAGOGASTRODUODENOSCOPY  2010  . REFRACTIVE SURGERY    . TONSILLECTOMY      Family History  Problem Relation Age of Onset  . Heart attack Father   . Prostate cancer Paternal Grandfather   . Colon cancer Neg Hx   . Esophageal cancer Neg Hx   . Rectal cancer Neg Hx   . Stomach cancer Neg Hx     Social History     Tobacco Use  . Smoking status: Never Smoker  . Smokeless tobacco: Never Used  Substance Use Topics  . Alcohol use: No  . Drug use: No    Current Outpatient Medications  Medication Sig Dispense Refill  . Alum & Mag Hydroxide-Simeth (MYLANTA PO) Take by mouth as needed.    . calcium citrate-vitamin D (CITRACAL+D) 315-200 MG-UNIT tablet Take 1 tablet by mouth daily.     . Capsaicin (CAPZASIN-HP) 0.1 % CREA Apply topically every morning.    . Cholecalciferol (VITAMIN D-1000 MAX ST) 1000 units tablet Take by mouth daily.     . diclofenac sodium (VOLTAREN) 1 % GEL Apply topically every morning.    Marland Kitchen glucosamine-chondroitin 500-400 MG tablet Take 2 tablets by mouth daily.     Marland Kitchen lisinopril (ZESTRIL) 5 MG tablet Take 5 mg by mouth daily.    Vladimir Faster Glycol-Propyl Glycol (SYSTANE OP) Apply 1 drop to eye at bedtime.     . RABEprazole (ACIPHEX) 20 MG tablet Take 1 tablet by mouth once daily 84 tablet 0  . Specialty Vitamins Products (ICAPS LUTEIN-ZEAXANTHIN PO) Take 1 tablet by mouth daily.     Marland Kitchen acetaminophen (ACETAMINOPHEN 8 HOUR) 650 MG CR tablet Take 650 mg by mouth as needed.  No current facility-administered medications for this visit.    Allergies  Allergen Reactions  . Aspirin   . Macrobid [Nitrofurantoin Macrocrystal]   . Polyethylene Glycol     Facial flushing and tingling  . Prednisone   . Red Yeast Rice [Cholestin]   . Sulfa Antibiotics   . Codeine Nausea Only    Review of Systems:  neg    Physical Exam:    Ht 4\' 11"  (1.499 m)   Wt 109 lb (49.4 kg)   BMI 22.02 kg/m  Filed Weights   06/01/19 1400  Weight: 109 lb (49.4 kg)   Televisit  I connected with  Jodi Jefferson on 06/01/19 by a video enabled telemedicine application and verified that I am speaking with the correct person using two identifiers.   I discussed the limitations of evaluation and management by telemedicine. The patient expressed understanding and agreed to proceed.  Discussed  with patient's son as well    Jodi Austria, MD 06/01/2019, 3:05 PM  Cc: [name], MD

## 2019-06-01 NOTE — Patient Instructions (Signed)
If you are age 84 or older, your body mass index should be between 23-30. Your Body mass index is 22.02 kg/m. If this is out of the aforementioned range listed, please consider follow up with your Primary Care Provider.  If you are age 50 or younger, your body mass index should be between 19-25. Your Body mass index is 22.02 kg/m. If this is out of the aformentioned range listed, please consider follow up with your Primary Care Provider.   We have sent the following medications to your pharmacy for you to pick up at your convenience: Aciphex   Thank you,  Dr. Jackquline Denmark

## 2019-06-06 DIAGNOSIS — M810 Age-related osteoporosis without current pathological fracture: Secondary | ICD-10-CM | POA: Diagnosis not present

## 2019-06-06 DIAGNOSIS — E538 Deficiency of other specified B group vitamins: Secondary | ICD-10-CM | POA: Diagnosis not present

## 2019-06-20 IMAGING — US ULTRASOUND ABDOMEN COMPLETE
1 series · 13 of 25 positions shown · non-contrast
Comparison: 12/20/2008 abdominal sonogram.

CLINICAL DATA: Epigastric abdominal pain, worse after eating.

EXAM:
ABDOMEN ULTRASOUND COMPLETE

[Series 1: ultrasound abdomen complete · 13 of 119 slices shown]
[im 1/119]
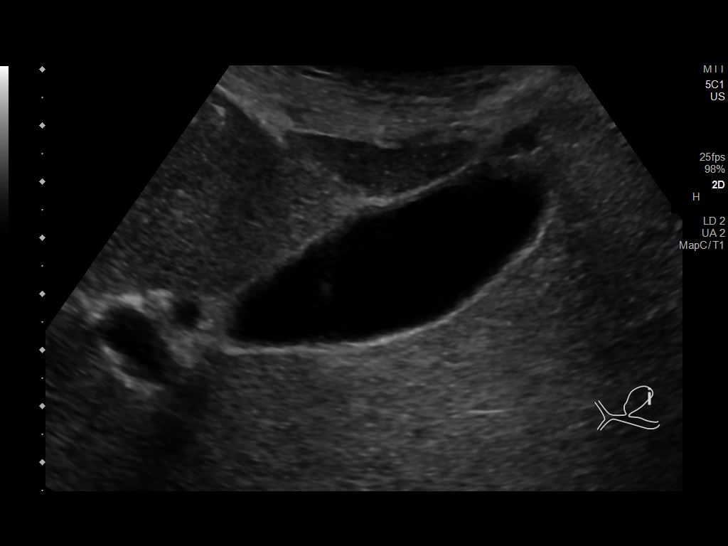
[im 10/119]
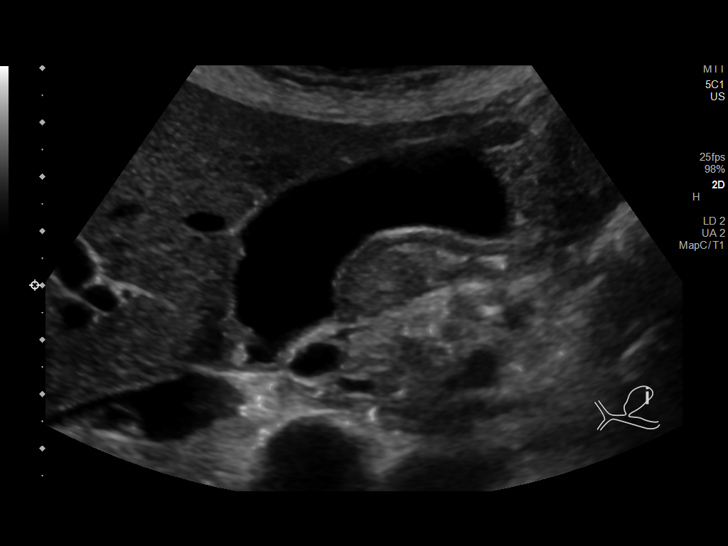
[im 20/119]
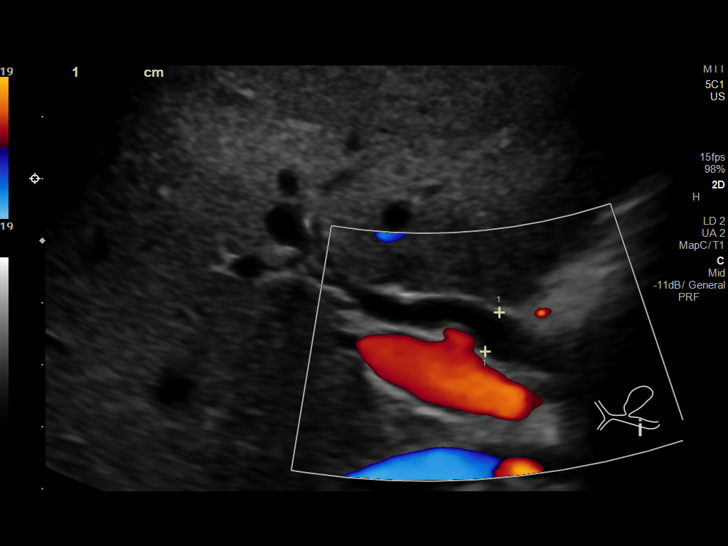
[im 30/119]
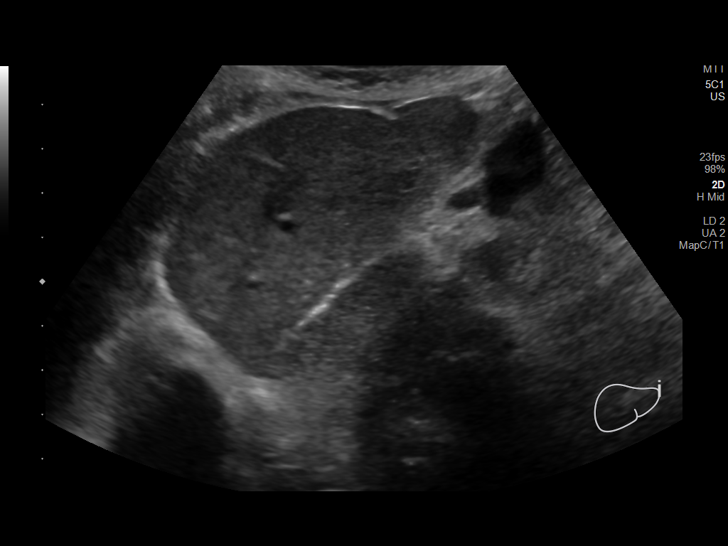
[im 40/119]
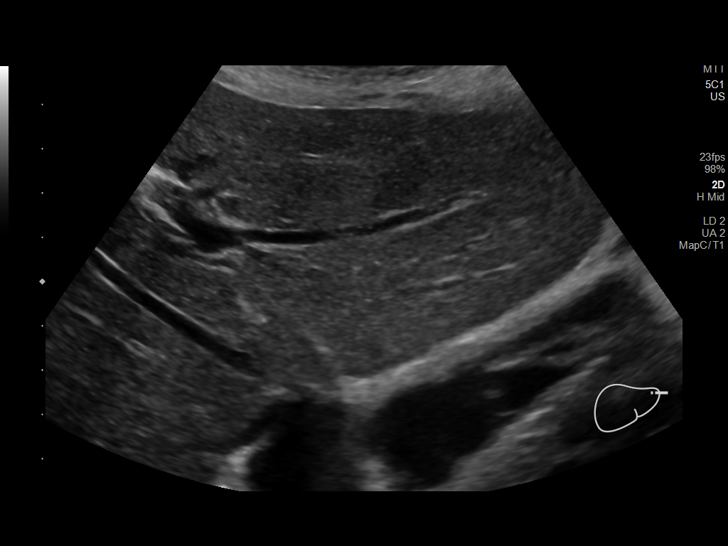
[im 50/119]
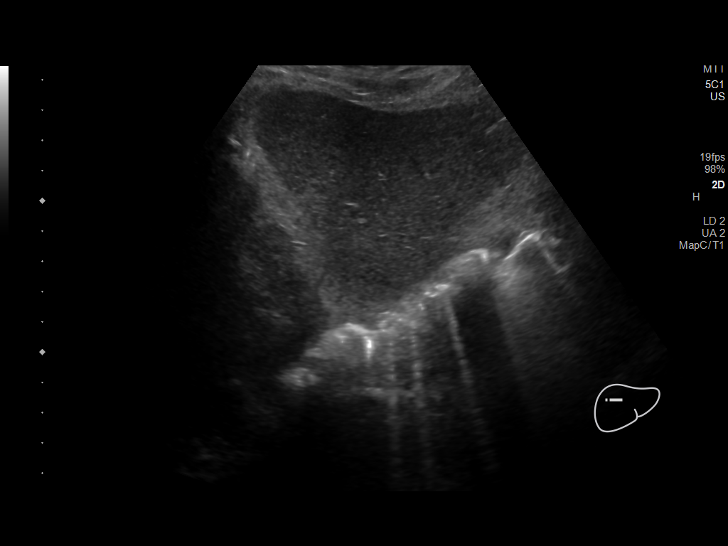
[im 60/119]
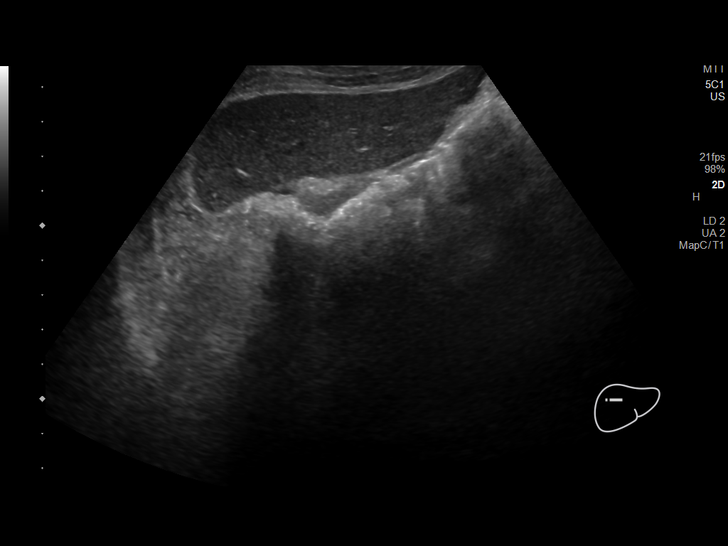
[im 69/119]
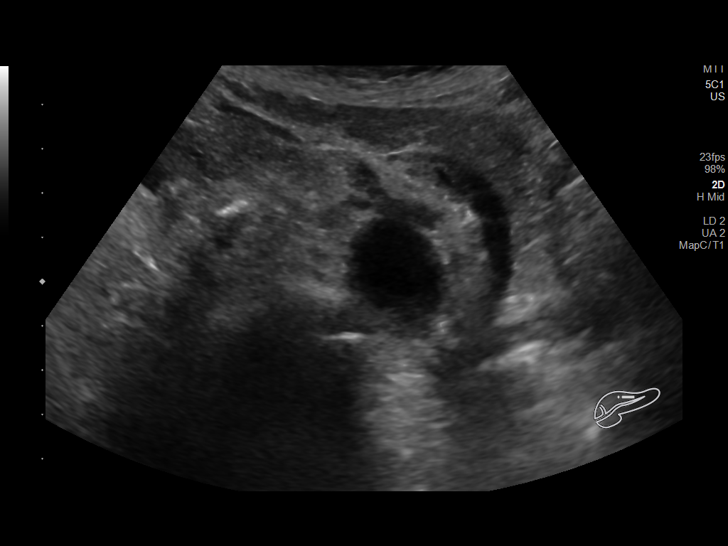
[im 79/119]
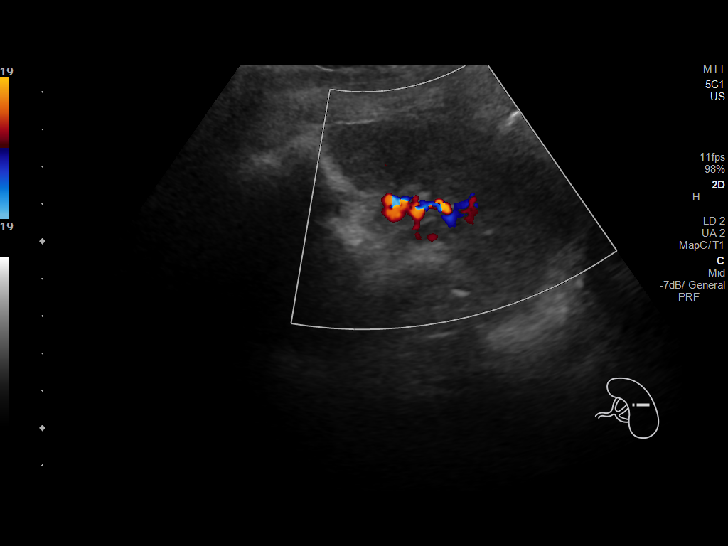
[im 89/119]
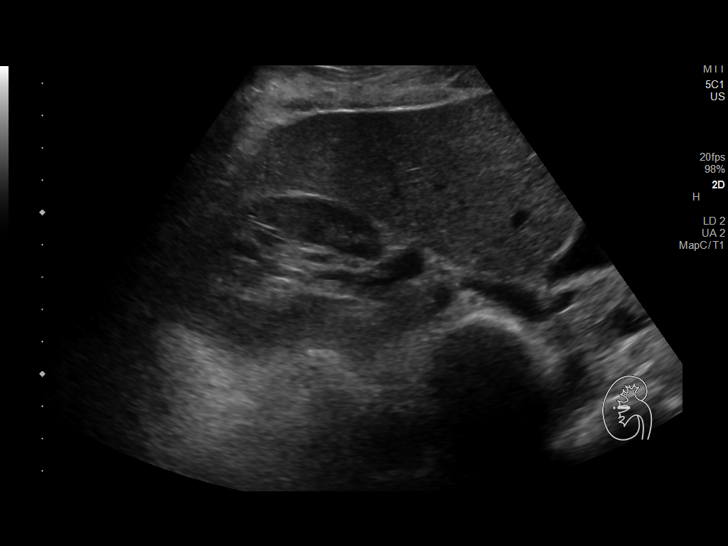
[im 99/119]
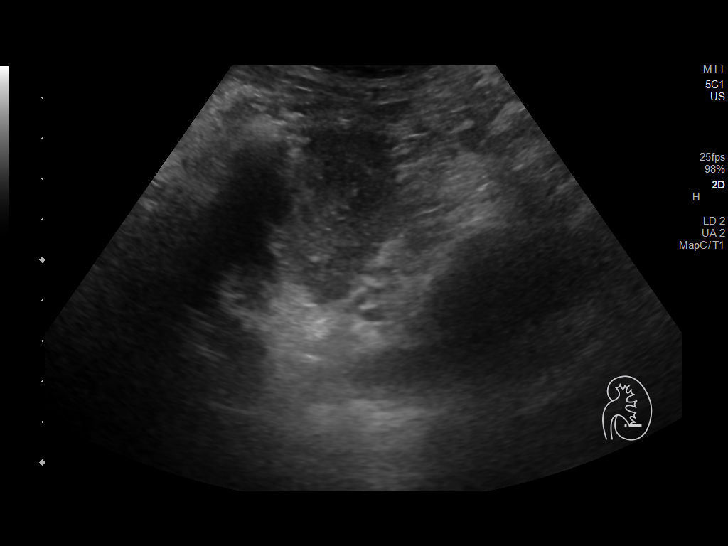
[im 109/119]
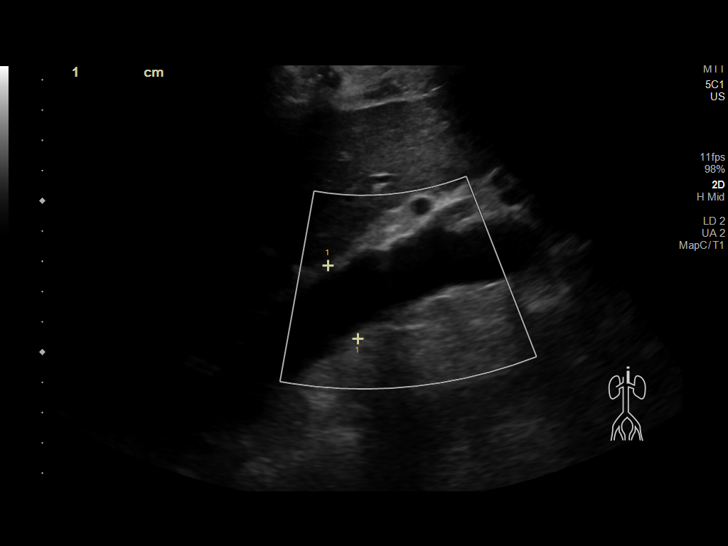
[im 119/119]
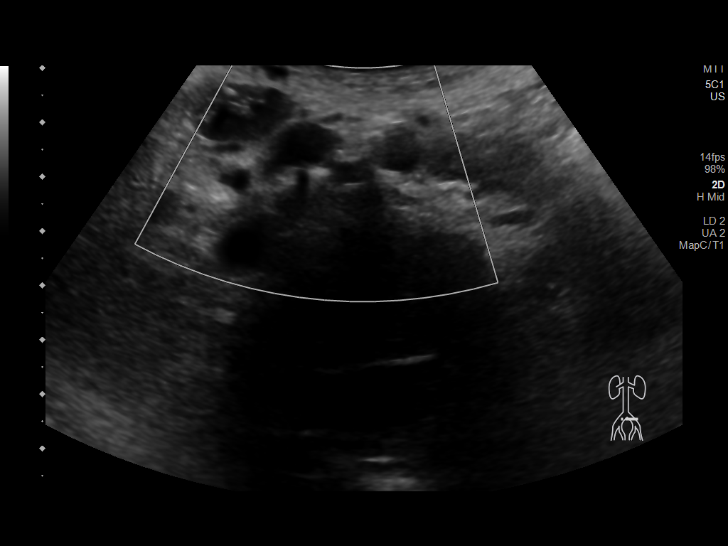

[13 of 25 positions shown; findings below may reference images not displayed]

FINDINGS: Gallbladder: No gallstones. No sonographic Murphy's sign. No
pericholecystic fluid. Focal wall thickening at the gallbladder
fundus with reverberation artifact, compatible with focal
adenomyomatosis. Otherwise normal gallbladder wall.

Common bile duct: Diameter: 6 mm

Liver: Background liver parenchymal echogenicity and echotexture are
normal. There is an ill-defined 2.7 x 1.2 x 2.2 cm focus in the
inferior left liver lobe with heterogeneous internal calcification,
not seen on 5161 abdominal sonogram. No additional liver lesions.
Portal vein is patent on color Doppler imaging with normal direction
of blood flow towards the liver.

IVC: No abnormality visualized.

Pancreas: Visualized portion unremarkable.

Spleen: Size and appearance within normal limits.

Right Kidney: Length: 10.1 cm. Echogenicity within normal limits. No
mass or hydronephrosis visualized.

Left Kidney: Length: 9.8 cm. Echogenicity within normal limits. No
hydronephrosis. A vague hypoechoic 2.0 x 1.8 x 1.8 cm focus in
interpolar left kidney does not deform outer renal contour, favoring
an artifactual focus.

Abdominal aorta: No aneurysm visualized.

Other findings: None.
IMPRESSION: 1. Ill-defined 2.7 cm focus in the inferior left liver lobe with
apparent heterogeneous internal calcification, not seen on 5161
abdominal sonogram, indeterminate. While potentially due to prior
granulomatous infection or other remote benign insult, the
possibility of a neoplastic calcified liver mass can not be entirely
excluded. This mass should be further characterized with triphasic
liver CT abdomen without and with IV contrast.
2. Vague hypoechoic focus in the interpolar left kidney without
renal contour deformity, favor an artifactual focus. This finding
can also be further assessed on the above CT abdomen without and
with IV contrast.
3. Focal adenomyomatosis of the fundal gallbladder wall. Otherwise
normal gallbladder, with no cholelithiasis. No biliary ductal
dilatation.

## 2019-06-23 ENCOUNTER — Telehealth: Payer: Self-pay | Admitting: Gastroenterology

## 2019-06-23 NOTE — Telephone Encounter (Signed)
Pt's son inquired whether pt can take rabeprazole on alternating days.  He reported that he sees pt getting weaker and wonders if the medication has an effect on her bones.

## 2019-06-26 NOTE — Telephone Encounter (Signed)
Please advise 

## 2019-06-26 NOTE — Telephone Encounter (Signed)
Can definitely try rabeprazole every other day. RG

## 2019-06-27 NOTE — Telephone Encounter (Signed)
I have called and left message for a return call.

## 2019-07-03 NOTE — Telephone Encounter (Signed)
I have called and left message for return phone call.

## 2019-07-27 DIAGNOSIS — M1712 Unilateral primary osteoarthritis, left knee: Secondary | ICD-10-CM | POA: Diagnosis not present

## 2019-09-06 ENCOUNTER — Other Ambulatory Visit: Payer: Self-pay | Admitting: Gastroenterology

## 2019-09-06 DIAGNOSIS — R1013 Epigastric pain: Secondary | ICD-10-CM

## 2019-09-26 DIAGNOSIS — M810 Age-related osteoporosis without current pathological fracture: Secondary | ICD-10-CM | POA: Diagnosis not present

## 2019-09-26 DIAGNOSIS — Z6821 Body mass index (BMI) 21.0-21.9, adult: Secondary | ICD-10-CM | POA: Diagnosis not present

## 2019-09-26 DIAGNOSIS — I10 Essential (primary) hypertension: Secondary | ICD-10-CM | POA: Diagnosis not present

## 2019-09-26 DIAGNOSIS — E538 Deficiency of other specified B group vitamins: Secondary | ICD-10-CM | POA: Diagnosis not present

## 2019-11-02 DIAGNOSIS — M1712 Unilateral primary osteoarthritis, left knee: Secondary | ICD-10-CM | POA: Diagnosis not present

## 2019-11-29 DIAGNOSIS — E559 Vitamin D deficiency, unspecified: Secondary | ICD-10-CM | POA: Diagnosis not present

## 2020-02-13 DIAGNOSIS — M1712 Unilateral primary osteoarthritis, left knee: Secondary | ICD-10-CM | POA: Diagnosis not present

## 2020-02-29 DIAGNOSIS — M810 Age-related osteoporosis without current pathological fracture: Secondary | ICD-10-CM | POA: Diagnosis not present

## 2020-02-29 DIAGNOSIS — E538 Deficiency of other specified B group vitamins: Secondary | ICD-10-CM | POA: Diagnosis not present

## 2020-04-04 DIAGNOSIS — M1712 Unilateral primary osteoarthritis, left knee: Secondary | ICD-10-CM | POA: Diagnosis not present

## 2020-04-25 DIAGNOSIS — D510 Vitamin B12 deficiency anemia due to intrinsic factor deficiency: Secondary | ICD-10-CM | POA: Diagnosis not present

## 2020-04-25 DIAGNOSIS — M25562 Pain in left knee: Secondary | ICD-10-CM | POA: Diagnosis not present

## 2020-04-25 DIAGNOSIS — Z6820 Body mass index (BMI) 20.0-20.9, adult: Secondary | ICD-10-CM | POA: Diagnosis not present

## 2020-04-25 DIAGNOSIS — M1990 Unspecified osteoarthritis, unspecified site: Secondary | ICD-10-CM | POA: Diagnosis not present

## 2020-05-16 DIAGNOSIS — M1990 Unspecified osteoarthritis, unspecified site: Secondary | ICD-10-CM | POA: Diagnosis not present

## 2020-05-16 DIAGNOSIS — Z6820 Body mass index (BMI) 20.0-20.9, adult: Secondary | ICD-10-CM | POA: Diagnosis not present

## 2020-05-16 DIAGNOSIS — Z79899 Other long term (current) drug therapy: Secondary | ICD-10-CM | POA: Diagnosis not present

## 2020-05-16 DIAGNOSIS — Z0000 Encounter for general adult medical examination without abnormal findings: Secondary | ICD-10-CM | POA: Diagnosis not present

## 2020-05-16 DIAGNOSIS — I10 Essential (primary) hypertension: Secondary | ICD-10-CM | POA: Diagnosis not present

## 2020-05-20 DIAGNOSIS — M1990 Unspecified osteoarthritis, unspecified site: Secondary | ICD-10-CM | POA: Diagnosis not present

## 2020-05-20 DIAGNOSIS — K5909 Other constipation: Secondary | ICD-10-CM | POA: Diagnosis not present

## 2020-05-20 DIAGNOSIS — Y748 Miscellaneous general hospital and personal-use devices associated with adverse incidents, not elsewhere classified: Secondary | ICD-10-CM | POA: Diagnosis not present

## 2020-05-20 DIAGNOSIS — R682 Dry mouth, unspecified: Secondary | ICD-10-CM | POA: Diagnosis not present

## 2020-05-23 ENCOUNTER — Encounter: Payer: Self-pay | Admitting: *Deleted

## 2020-05-23 ENCOUNTER — Encounter: Payer: Self-pay | Admitting: Cardiology

## 2020-05-28 ENCOUNTER — Telehealth: Payer: Self-pay | Admitting: Cardiology

## 2020-05-28 NOTE — Telephone Encounter (Signed)
New message:      Pt is supposed to have lab work. The son wants to know if they can check her liver and, gallbladder through this lab work

## 2020-05-28 NOTE — Telephone Encounter (Signed)
Called and spoke to patient's son per dpr. Advised him if Dr. Bettina Gavia wants gallbladder checked he may be able to do labs for that and liver. But that will determine how the visit goes, advised him the patient may be referred to pcp for that as well since it is not a cardiac issue per se. Patient son verbally understood.

## 2020-06-06 DIAGNOSIS — T7840XA Allergy, unspecified, initial encounter: Secondary | ICD-10-CM | POA: Insufficient documentation

## 2020-06-06 DIAGNOSIS — H269 Unspecified cataract: Secondary | ICD-10-CM | POA: Insufficient documentation

## 2020-06-09 NOTE — Progress Notes (Addendum)
Cardiology Office Note:    Date:  06/10/2020   ID:  Jodi Jefferson, Jodi Jefferson February 04, 1932, MRN [id number]  PCP:  [name], MD  Cardiologist:  Shirlee More, MD   Referring MD: [name], MD    07/11/2020:  Her echocardiogram shows normal cardiac function ejection fraction and no aortic stenosis. Proceed with her planned surgery   ASSESSMENT:    1. Preoperative cardiovascular examination   2. Benign essential hypertension   3. Hyperlipemia, mixed   4. Frailty    PLAN:    In order of problems listed above:  1. Her planned procedure is intermediate risk, surgery if performed is elective, note from Dr. Beckey Rutter said she would have spinal anesthetic and anticipates left total hip arthroplasty.  Her hypertension is well controlled on ACE inhibitor her EKG is abnormal with possible old anterior septal MI and physical examination suggest an element of aortic stenosis mild to moderate.  I told the family prior to surgery she needs an echocardiogram to define the presence of ventricular dysfunction and aortic stenosis and severity and provided she does not have severe aortic stenosis she is an appropriate surgical operative candidate. 2. Stable at target continue ACE inhibitor 3. Currently not on lipid-lowering therapy 4. She is quite frail will to affect her postoperative recovery.  Next appointment as needed if she has moderate or greater aortic stenosis I will bring her back to my office prior to surgery   Medication Adjustments/Labs and Tests Ordered: Current medicines are reviewed at length with the patient today.  Concerns regarding medicines are outlined above.  Orders Placed This Encounter  Procedures  . EKG 12-Lead  . ECHOCARDIOGRAM COMPLETE   No orders of the defined types were placed in this encounter.    Chief Complaint  Patient presents with  . Pre-op Exam    History of Present Illness:    Jodi Jefferson is a 87 y.o. female with a  history of hypertension and hyperlipidemia who is being seen today for preoperative cardiovascular evaluation at the request of [place], John F. II, MD. Seen her in the office October 2018 for chest pain that was felt to be possibly cardiac with prominent esophageal reflux symptoms.  Myocardial perfusion study was ordered but never performed.  CT scan of the abdomen and pelvis August 2020 showed SMA stenosis and aortic atherosclerosis. Her revised cardiac surgical risk index is 0. Carries a note from Dr. Beckey Rutter, MD [facility] that she is scheduled for total hip arthroplasty under anticipated spinal anesthetic.  She is considering total hip arthroplasty Dr. Laurance Flatten in [place]. They requested me to send a copy to Dr. [facility]. There does not seem to be a specific reason she is here today although her EKG is abnormal possible old anterior septal MI and she has a heart murmur and some degree of aortic stenosis probable on physical exam. She is extremely limited her activity she has not walked since the beginning of January due to right hip and flank pain and diffuse weakness. She has had no edema chest pain shortness of breath palpitation or syncope. She has no known coronary disease atrial fibrillation or heart failure.  Past Medical History:  Diagnosis Date  . Abnormal EKG 01/26/2017  . Allergy   . Anxiety 01/26/2017  . Arthritis 01/26/2017  . Benign essential hypertension 01/26/2017  . Cataract   . Chest pain 01/26/2017  . Closed pelvic fracture ([place]) 01/26/2017  . GERD (gastroesophageal  reflux disease) 01/26/2017  . Hyperlipemia, mixed 01/26/2017  . Macular degeneration 01/26/2017  . Osteoporosis 01/26/2017    Past Surgical History:  Procedure Laterality Date  . ABDOMINAL HYSTERECTOMY    . APPENDECTOMY    . COLONOSCOPY     many years ago   . ESOPHAGOGASTRODUODENOSCOPY  2010  . REFRACTIVE SURGERY    . TONSILLECTOMY      Current Medications: Current Meds   Medication Sig  . acetaminophen (TYLENOL) 650 MG CR tablet Take 650 mg by mouth as needed.   . calcium-vitamin D (OSCAL WITH D) 500-200 MG-UNIT tablet Take 1 tablet by mouth daily.  . Cholecalciferol 25 MCG (1000 UT) tablet Take by mouth daily.   Marland Kitchen denosumab (PROLIA) 60 MG/ML SOSY injection Inject 60 mg into the skin every 6 (six) months.  . diclofenac sodium (VOLTAREN) 1 % GEL Apply topically every morning.  . famotidine (PEPCID) 40 MG tablet Take 40 mg by mouth at bedtime as needed for heartburn or indigestion.  Marland Kitchen glucosamine-chondroitin 500-400 MG tablet Take 2 tablets by mouth daily.   Marland Kitchen lisinopril (ZESTRIL) 10 MG tablet Take 10 mg by mouth daily.  . meloxicam (MOBIC) 7.5 MG tablet Take 7.5 mg by mouth daily.  . Multiple Vitamins-Minerals (CENTRUM SILVER PO) Take 1 tablet by mouth daily.  . Multiple Vitamins-Minerals (ICAPS MV PO) Take 1 tablet by mouth daily.  Vladimir Faster Glycol-Propyl Glycol (SYSTANE OP) Apply 1 drop to eye at bedtime.   . polyethylene glycol (MIRALAX / GLYCOLAX) 17 g packet Take 17 g by mouth daily.  . Wheat Dextrin (BENEFIBER PO) Take 1 tablet by mouth daily.     Allergies:   Aspirin, Macrobid [nitrofurantoin macrocrystal], Motrin [ibuprofen], Polyethylene glycol, Prednisone, Red yeast rice [cholestin], Sulfa antibiotics, and Codeine   Social History   Socioeconomic History  . Marital status: Married    Spouse name: Not on file  . Number of children: Not on file  . Years of education: Not on file  . Highest education level: Not on file  Occupational History  . Not on file  Tobacco Use  . Smoking status: Never Smoker  . Smokeless tobacco: Never Used  Vaping Use  . Vaping Use: Never used  Substance and Sexual Activity  . Alcohol use: No  . Drug use: No  . Sexual activity: Not on file  Other Topics Concern  . Not on file  Social History Narrative  . Not on file   Social Determinants of Health   Financial Resource Strain: Not on file  Food  Insecurity: Not on file  Transportation Needs: Not on file  Physical Activity: Not on file  Stress: Not on file  Social Connections: Not on file     Family History: The patient's family history includes Heart attack in her father; Prostate cancer in her paternal grandfather. There is no history of Colon cancer, Esophageal cancer, Rectal cancer, or Stomach cancer.  ROS:   ROS Please see the history of present illness.     All other systems reviewed and are negative.  EKGs/Labs/Other Studies Reviewed:    The following studies were reviewed today:   EKG:  EKG is  ordered today.  The ekg ordered today is personally reviewed and demonstrates sinus rhythm narrow QRS possible anteroseptal MI versus lead placement left axis deviation late transition precordial leads  Recent Labs: 09/29/2016 cholesterol 201 LDL 107 triglycerides 295 HDL 35 05/16/2020 creatinine 0.  4 0 BUN 13 random glucose 118 A1c 5.7%  Physical Exam:  VS:  BP (!) 110/58   Pulse (!) 54   Ht 4' 10.5" (1.486 m)   Wt 95 lb (43.1 kg)   SpO2 98%   BMI 19.52 kg/m     Wt Readings from Last 3 Encounters:  06/10/20 95 lb (43.1 kg)  05/16/20 102 lb (46.3 kg)  06/01/19 109 lb (49.4 kg)     GEN: Tiny woman less than 100 pounds she looks quite frail in my office today poor muscle mass well nourished, well developed in no acute distress HEENT: Normal NECK: No JVD; No carotid bruits LYMPHATICS: No lymphadenopathy CARDIAC: 2/6 midsystolic ejection murmur does not encompass S2 radiates to the right clavicular area S2 still splits does not radiate to the carotids no aortic regurgitation RRR, norubs, gallops RESPIRATORY:  Clear to auscultation without rales, wheezing or rhonchi  ABDOMEN: Soft, non-tender, non-distended MUSCULOSKELETAL:  No edema; No deformity  SKIN: Warm and dry NEUROLOGIC:  Alert and oriented x 3 PSYCHIATRIC:  Normal affect     Signed, Shirlee More, MD  06/10/2020 3:44 PM    [facility]

## 2020-06-10 ENCOUNTER — Encounter: Payer: Self-pay | Admitting: Cardiology

## 2020-06-10 ENCOUNTER — Ambulatory Visit (INDEPENDENT_AMBULATORY_CARE_PROVIDER_SITE_OTHER): Payer: Medicare Other | Admitting: Cardiology

## 2020-06-10 ENCOUNTER — Other Ambulatory Visit: Payer: Self-pay

## 2020-06-10 VITALS — BP 110/58 | HR 54 | Ht 58.5 in | Wt 95.0 lb

## 2020-06-10 DIAGNOSIS — Z01810 Encounter for preprocedural cardiovascular examination: Secondary | ICD-10-CM

## 2020-06-10 DIAGNOSIS — R54 Age-related physical debility: Secondary | ICD-10-CM

## 2020-06-10 DIAGNOSIS — I10 Essential (primary) hypertension: Secondary | ICD-10-CM | POA: Diagnosis not present

## 2020-06-10 DIAGNOSIS — E782 Mixed hyperlipidemia: Secondary | ICD-10-CM | POA: Diagnosis not present

## 2020-06-10 NOTE — Patient Instructions (Signed)
Medication Instructions:  Your physician recommends that you continue on your current medications as directed. Please refer to the Current Medication list given to you today.  *If you need a refill on your cardiac medications before your next appointment, please call your pharmacy*   Lab Work: None If you have labs (blood work) drawn today and your tests are completely normal, you will receive your results only by: . MyChart Message (if you have MyChart) OR . A paper copy in the mail If you have any lab test that is abnormal or we need to change your treatment, we will call you to review the results.   Testing/Procedures: Your physician has requested that you have an echocardiogram. Echocardiography is a painless test that uses sound waves to create images of your heart. It provides your doctor with information about the size and shape of your heart and how well your heart's chambers and valves are working. This procedure takes approximately one hour. There are no restrictions for this procedure.     Follow-Up: At [facility], you and your health needs are our priority.  As part of our continuing mission to provide you with exceptional heart care, we have created designated Provider Care Teams.  These Care Teams include your primary Cardiologist (physician) and Advanced Practice Providers (APPs -  Physician Assistants and Nurse Practitioners) who all work together to provide you with the care you need, when you need it.  We recommend signing up for the patient portal called "MyChart".  Sign up information is provided on this After Visit Summary.  MyChart is used to connect with patients for Virtual Visits (Telemedicine).  Patients are able to view lab/test results, encounter notes, upcoming appointments, etc.  Non-urgent messages can be sent to your provider as well.   To learn more about what you can do with MyChart, go to [email or web address].    Your next appointment:   As needed    The format for your next appointment:   In Person  Provider:   Brian Munley, MD   Other Instructions   

## 2020-06-11 DIAGNOSIS — R0781 Pleurodynia: Secondary | ICD-10-CM | POA: Diagnosis not present

## 2020-06-11 DIAGNOSIS — Z681 Body mass index (BMI) 19 or less, adult: Secondary | ICD-10-CM | POA: Diagnosis not present

## 2020-07-05 DIAGNOSIS — M170 Bilateral primary osteoarthritis of knee: Secondary | ICD-10-CM | POA: Diagnosis not present

## 2020-07-05 DIAGNOSIS — M1612 Unilateral primary osteoarthritis, left hip: Secondary | ICD-10-CM | POA: Diagnosis not present

## 2020-07-05 DIAGNOSIS — M1712 Unilateral primary osteoarthritis, left knee: Secondary | ICD-10-CM | POA: Diagnosis not present

## 2020-07-09 DIAGNOSIS — E538 Deficiency of other specified B group vitamins: Secondary | ICD-10-CM | POA: Diagnosis not present

## 2020-07-10 ENCOUNTER — Other Ambulatory Visit: Payer: Self-pay

## 2020-07-10 ENCOUNTER — Other Ambulatory Visit: Payer: Medicare Other

## 2020-07-10 ENCOUNTER — Ambulatory Visit (INDEPENDENT_AMBULATORY_CARE_PROVIDER_SITE_OTHER): Payer: Medicare Other

## 2020-07-10 DIAGNOSIS — Z01810 Encounter for preprocedural cardiovascular examination: Secondary | ICD-10-CM

## 2020-07-10 LAB — ECHOCARDIOGRAM COMPLETE
Area-P 1/2: 2.8 cm2
S' Lateral: 1.9 cm

## 2020-07-10 NOTE — Progress Notes (Signed)
Complete echocardiogram performed.  Jimmy Kana Reimann RDCS, RVT  

## 2020-09-05 DIAGNOSIS — E538 Deficiency of other specified B group vitamins: Secondary | ICD-10-CM | POA: Diagnosis not present

## 2020-09-05 DIAGNOSIS — M810 Age-related osteoporosis without current pathological fracture: Secondary | ICD-10-CM | POA: Diagnosis not present

## 2020-10-09 DIAGNOSIS — M1712 Unilateral primary osteoarthritis, left knee: Secondary | ICD-10-CM | POA: Diagnosis not present

## 2020-10-09 DIAGNOSIS — M25562 Pain in left knee: Secondary | ICD-10-CM | POA: Diagnosis not present

## 2020-12-10 DIAGNOSIS — D510 Vitamin B12 deficiency anemia due to intrinsic factor deficiency: Secondary | ICD-10-CM | POA: Diagnosis not present

## 2021-01-16 DIAGNOSIS — M25562 Pain in left knee: Secondary | ICD-10-CM | POA: Diagnosis not present

## 2021-01-28 DIAGNOSIS — Z23 Encounter for immunization: Secondary | ICD-10-CM | POA: Diagnosis not present

## 2021-01-28 DIAGNOSIS — E538 Deficiency of other specified B group vitamins: Secondary | ICD-10-CM | POA: Diagnosis not present

## 2021-04-16 DIAGNOSIS — M1712 Unilateral primary osteoarthritis, left knee: Secondary | ICD-10-CM | POA: Diagnosis not present

## 2021-04-24 DIAGNOSIS — M810 Age-related osteoporosis without current pathological fracture: Secondary | ICD-10-CM | POA: Diagnosis not present

## 2021-04-24 DIAGNOSIS — E538 Deficiency of other specified B group vitamins: Secondary | ICD-10-CM | POA: Diagnosis not present

## 2021-05-16 DIAGNOSIS — D510 Vitamin B12 deficiency anemia due to intrinsic factor deficiency: Secondary | ICD-10-CM | POA: Diagnosis not present

## 2021-07-16 DIAGNOSIS — M1712 Unilateral primary osteoarthritis, left knee: Secondary | ICD-10-CM | POA: Diagnosis not present

## 2021-07-22 DIAGNOSIS — M1990 Unspecified osteoarthritis, unspecified site: Secondary | ICD-10-CM | POA: Diagnosis not present

## 2021-07-22 DIAGNOSIS — I10 Essential (primary) hypertension: Secondary | ICD-10-CM | POA: Diagnosis not present

## 2021-07-22 DIAGNOSIS — Z9181 History of falling: Secondary | ICD-10-CM | POA: Diagnosis not present

## 2021-07-22 DIAGNOSIS — Z0000 Encounter for general adult medical examination without abnormal findings: Secondary | ICD-10-CM | POA: Diagnosis not present

## 2021-07-22 DIAGNOSIS — Z681 Body mass index (BMI) 19 or less, adult: Secondary | ICD-10-CM | POA: Diagnosis not present

## 2021-07-22 DIAGNOSIS — D510 Vitamin B12 deficiency anemia due to intrinsic factor deficiency: Secondary | ICD-10-CM | POA: Diagnosis not present

## 2021-09-19 DIAGNOSIS — D519 Vitamin B12 deficiency anemia, unspecified: Secondary | ICD-10-CM | POA: Diagnosis not present

## 2021-10-17 ENCOUNTER — Inpatient Hospital Stay (HOSPITAL_COMMUNITY)
Admission: EM | Admit: 2021-10-17 | Discharge: 2021-10-22 | DRG: 394 | Disposition: A | Discharge status: Home Health | Payer: Medicare Other | Attending: Internal Medicine | Admitting: Internal Medicine

## 2021-10-17 ENCOUNTER — Encounter (HOSPITAL_COMMUNITY): Payer: Self-pay

## 2021-10-17 ENCOUNTER — Emergency Department (HOSPITAL_COMMUNITY): Payer: Medicare Other

## 2021-10-17 ENCOUNTER — Other Ambulatory Visit: Payer: Self-pay

## 2021-10-17 DIAGNOSIS — E538 Deficiency of other specified B group vitamins: Secondary | ICD-10-CM | POA: Diagnosis present

## 2021-10-17 DIAGNOSIS — Z882 Allergy status to sulfonamides status: Secondary | ICD-10-CM | POA: Diagnosis not present

## 2021-10-17 DIAGNOSIS — K5900 Constipation, unspecified: Secondary | ICD-10-CM | POA: Diagnosis present

## 2021-10-17 DIAGNOSIS — Z809 Family history of malignant neoplasm, unspecified: Secondary | ICD-10-CM | POA: Diagnosis not present

## 2021-10-17 DIAGNOSIS — Z888 Allergy status to other drugs, medicaments and biological substances status: Secondary | ICD-10-CM

## 2021-10-17 DIAGNOSIS — Z885 Allergy status to narcotic agent status: Secondary | ICD-10-CM | POA: Diagnosis not present

## 2021-10-17 DIAGNOSIS — K4090 Unilateral inguinal hernia, without obstruction or gangrene, not specified as recurrent: Secondary | ICD-10-CM

## 2021-10-17 DIAGNOSIS — E876 Hypokalemia: Secondary | ICD-10-CM | POA: Diagnosis present

## 2021-10-17 DIAGNOSIS — Z886 Allergy status to analgesic agent status: Secondary | ICD-10-CM

## 2021-10-17 DIAGNOSIS — K257 Chronic gastric ulcer without hemorrhage or perforation: Secondary | ICD-10-CM | POA: Diagnosis present

## 2021-10-17 DIAGNOSIS — K56609 Unspecified intestinal obstruction, unspecified as to partial versus complete obstruction: Secondary | ICD-10-CM | POA: Diagnosis not present

## 2021-10-17 DIAGNOSIS — I10 Essential (primary) hypertension: Secondary | ICD-10-CM | POA: Diagnosis present

## 2021-10-17 DIAGNOSIS — K219 Gastro-esophageal reflux disease without esophagitis: Secondary | ICD-10-CM | POA: Diagnosis present

## 2021-10-17 DIAGNOSIS — M1990 Unspecified osteoarthritis, unspecified site: Secondary | ICD-10-CM | POA: Diagnosis present

## 2021-10-17 DIAGNOSIS — D72829 Elevated white blood cell count, unspecified: Secondary | ICD-10-CM | POA: Diagnosis present

## 2021-10-17 DIAGNOSIS — K56600 Partial intestinal obstruction, unspecified as to cause: Secondary | ICD-10-CM | POA: Diagnosis present

## 2021-10-17 DIAGNOSIS — K2289 Other specified disease of esophagus: Secondary | ICD-10-CM | POA: Diagnosis not present

## 2021-10-17 DIAGNOSIS — Z79899 Other long term (current) drug therapy: Secondary | ICD-10-CM | POA: Diagnosis not present

## 2021-10-17 DIAGNOSIS — M810 Age-related osteoporosis without current pathological fracture: Secondary | ICD-10-CM | POA: Diagnosis present

## 2021-10-17 DIAGNOSIS — K3189 Other diseases of stomach and duodenum: Secondary | ICD-10-CM | POA: Diagnosis present

## 2021-10-17 DIAGNOSIS — K4030 Unilateral inguinal hernia, with obstruction, without gangrene, not specified as recurrent: Secondary | ICD-10-CM | POA: Diagnosis not present

## 2021-10-17 DIAGNOSIS — R109 Unspecified abdominal pain: Secondary | ICD-10-CM | POA: Diagnosis not present

## 2021-10-17 DIAGNOSIS — Z8249 Family history of ischemic heart disease and other diseases of the circulatory system: Secondary | ICD-10-CM | POA: Diagnosis not present

## 2021-10-17 DIAGNOSIS — I700 Atherosclerosis of aorta: Secondary | ICD-10-CM | POA: Diagnosis not present

## 2021-10-17 DIAGNOSIS — K449 Diaphragmatic hernia without obstruction or gangrene: Secondary | ICD-10-CM | POA: Diagnosis not present

## 2021-10-17 DIAGNOSIS — F419 Anxiety disorder, unspecified: Secondary | ICD-10-CM | POA: Diagnosis present

## 2021-10-17 DIAGNOSIS — K920 Hematemesis: Secondary | ICD-10-CM

## 2021-10-17 DIAGNOSIS — R1110 Vomiting, unspecified: Secondary | ICD-10-CM | POA: Diagnosis not present

## 2021-10-17 DIAGNOSIS — R9431 Abnormal electrocardiogram [ECG] [EKG]: Secondary | ICD-10-CM | POA: Diagnosis not present

## 2021-10-17 LAB — COMPREHENSIVE METABOLIC PANEL
ALT: 18 U/L (ref 0–44)
AST: 22 U/L (ref 15–41)
Albumin: 4 g/dL (ref 3.5–5.0)
Alkaline Phosphatase: 61 U/L (ref 38–126)
Anion gap: 9 (ref 5–15)
BUN: 15 mg/dL (ref 8–23)
CO2: 28 mmol/L (ref 22–32)
Calcium: 10 mg/dL (ref 8.9–10.3)
Chloride: 103 mmol/L (ref 98–111)
Creatinine, Ser: 0.59 mg/dL (ref 0.44–1.00)
GFR, Estimated: >60 mL/min (ref >60)
Glucose, Bld: 168 mg/dL — ABNORMAL HIGH (ref 70–99)
Potassium: 3.4 mmol/L — ABNORMAL LOW (ref 3.5–5.1)
Sodium: 140 mmol/L (ref 135–145)
Total Bilirubin: 0.6 mg/dL (ref 0.3–1.2)
Total Protein: 7.2 g/dL (ref 6.5–8.1)

## 2021-10-17 LAB — URINALYSIS, ROUTINE W REFLEX MICROSCOPIC
Bilirubin Urine: NEGATIVE
Glucose, UA: NEGATIVE mg/dL
Hgb urine dipstick: NEGATIVE
Ketones, ur: NEGATIVE mg/dL
Leukocytes,Ua: NEGATIVE
Nitrite: NEGATIVE
Protein, ur: 30 mg/dL — AB
Specific Gravity, Urine: 1.015 (ref 1.005–1.030)
pH: 7 (ref 5.0–8.0)

## 2021-10-17 LAB — LIPASE, BLOOD: Lipase: 30 U/L (ref 11–51)

## 2021-10-17 LAB — CBC
HCT: 41.1 % (ref 36.0–46.0)
Hemoglobin: 13.3 g/dL (ref 12.0–15.0)
MCH: 31.1 pg (ref 26.0–34.0)
MCHC: 32.4 g/dL (ref 30.0–36.0)
MCV: 96 fL (ref 80.0–100.0)
Platelets: 272 10*3/uL (ref 150–400)
RBC: 4.28 MIL/uL (ref 3.87–5.11)
RDW: 12.9 % (ref 11.5–15.5)
WBC: 18 10*3/uL — ABNORMAL HIGH (ref 4.0–10.5)
nRBC: 0 % (ref 0.0–0.2)

## 2021-10-17 MED ORDER — ONDANSETRON HCL 4 MG/2ML IJ SOLN
4.0000 mg | Freq: Four times a day (QID) | INTRAMUSCULAR | Status: DC | PRN
  Administered 2021-10-20 – 2021-10-22 (×2): 4 mg via INTRAVENOUS
  Filled 2021-10-17 (×2): qty 2

## 2021-10-17 MED ORDER — ACETAMINOPHEN 325 MG PO TABS
650.0000 mg | ORAL_TABLET | Freq: Four times a day (QID) | ORAL | Status: DC | PRN
  Administered 2021-10-19 – 2021-10-21 (×3): 650 mg via ORAL
  Filled 2021-10-17 (×3): qty 2

## 2021-10-17 MED ORDER — ONDANSETRON HCL 4 MG/2ML IJ SOLN
4.0000 mg | Freq: Once | INTRAMUSCULAR | Status: AC
  Administered 2021-10-17: 4 mg via INTRAVENOUS
  Filled 2021-10-17: qty 2

## 2021-10-17 MED ORDER — IOHEXOL 300 MG/ML  SOLN
80.0000 mL | Freq: Once | INTRAMUSCULAR | Status: AC | PRN
  Administered 2021-10-17: 80 mL via INTRAVENOUS

## 2021-10-17 MED ORDER — ONDANSETRON 4 MG PO TBDP
4.0000 mg | ORAL_TABLET | Freq: Once | ORAL | Status: AC
  Administered 2021-10-17: 4 mg via ORAL
  Filled 2021-10-17: qty 1

## 2021-10-17 MED ORDER — SODIUM CHLORIDE 0.9 % IV BOLUS
1000.0000 mL | Freq: Once | INTRAVENOUS | Status: AC
  Administered 2021-10-17: 1000 mL via INTRAVENOUS

## 2021-10-17 MED ORDER — MORPHINE SULFATE (PF) 4 MG/ML IV SOLN: 4.0000 mg | INTRAVENOUS | Status: DC | PRN

## 2021-10-17 MED ORDER — MORPHINE SULFATE (PF) 2 MG/ML IV SOLN
2.0000 mg | Freq: Once | INTRAVENOUS | Status: AC
  Administered 2021-10-17: 2 mg via INTRAVENOUS
  Filled 2021-10-17: qty 1

## 2021-10-17 MED ORDER — NALOXONE HCL 0.4 MG/ML IJ SOLN: 0.4000 mg | INTRAMUSCULAR | Status: DC | PRN

## 2021-10-17 MED ORDER — ACETAMINOPHEN 650 MG RE SUPP: 650.0000 mg | Freq: Four times a day (QID) | RECTAL | Status: DC | PRN

## 2021-10-17 MED ORDER — LACTATED RINGERS IV SOLN: INTRAVENOUS | Status: DC

## 2021-10-17 NOTE — ED Provider Triage Note (Signed)
Emergency Medicine Provider Triage Evaluation Note  Jodi Jefferson , a 88 y.o. female  was evaluated in triage.  Pt complains of left upper quadrant left lower quadrant abdominal pain started 3 days ago, worse in the last 1-1/2 days.  She endorses nausea, vomiting.  She denies fever, chills.  She denies chest pain, shortness of breath.  She endorses history of appendicitis, with appendix removal.  She has history of acid reflux, hypertension, hyperlipidemia.  He noted some abdominal distention.  She came from home.  Review of Systems  Positive: Abdominal pain, nausea, vomiting Negative: Fever, chills  Physical Exam  BP (!) 193/83 (BP Location: Left Arm)   Pulse 64   Temp 98.4 F (36.9 C) (Oral)   Resp 18   Wt 44.9 kg   SpO2 97%   BMI 20.34 kg/m  Gen:   Awake, no distress   Resp:  Normal effort  MSK:   Moves extremities without difficulty  Other:  Somewhat distended abdomen, tender to palpation in left upper and left lower quadrants.  No rebound, rigidity, guarding.  No significant tenderness palpation of bladder.  Patient with urine output 30 minutes prior to arrival.  No history of urinary retention.  Medical Decision Making  Medically screening exam initiated at 5:09 PM.  Appropriate orders placed.  Issis Javeah Loeza was informed that the remainder of the evaluation will be completed by another provider, this initial triage assessment does not replace that evaluation, and the importance of remaining in the ED until their evaluation is complete.  Workup initiated   Anselmo Pickler, [place] 10/17/21 1713

## 2021-10-17 NOTE — ED Provider Notes (Signed)
[facility] EMERGENCY DEPARTMENT Provider Note   CSN: [id number] Arrival date & time: 10/17/21  1658     History  Chief Complaint  Patient presents with   Abdominal Pain    Jodi Jefferson is a 88 y.o. female with a history of hypertension, GERD, anxiety, osteoporosis, arthritis, abdominal hysterectomy.  Presents emergency department complaining of abdominal pain, nausea, and vomiting.  Patient reports that over the last week she has been having constant abdominal pain.  Pain has come progressively worse over time.  Pain is located throughout her entire abdomen.  Pain is worse with touch.  Patient reports that she has had slightly decreased p.o. intake over the last few days as well.  Patient states that she has been having nausea and vomiting on a daily basis over the last week.  States that she has vomited 4-5 times in the last 24 hours.  Describes emesis as stomach contents and bilious.  Denies any hematemesis or coffee-ground emesis.  Patient does endorse urinary frequency however states that she does drink lots of water on her right leg regular basis.  Patient states that she had a large breakfast earlier this morning however her son states that she did not see her eat anything.  Patient has not had her hypertension medications.  States that she normally takes medication at night.  Denies any fever, chills, constipation, diarrhea, blood in stool, melena, dysuria, hematuria, urinary urgency, vaginal pain, vaginal bleeding, vaginal discharge, lightheadedness, syncope.   Abdominal Pain Associated symptoms: nausea and vomiting   Associated symptoms: no chest pain, no chills, no constipation, no diarrhea, no dysuria, no fever, no hematuria, no shortness of breath, no vaginal bleeding and no vaginal discharge        Home Medications Prior to Admission medications   Medication Sig Start Date End Date Taking? Authorizing Provider  acetaminophen (TYLENOL) 650 MG CR  tablet Take 650 mg by mouth as needed.     [provider]  calcium-vitamin D (OSCAL WITH D) 500-200 MG-UNIT tablet Take 1 tablet by mouth daily.    [provider]  Cholecalciferol 25 MCG (1000 UT) tablet Take by mouth daily.     [provider]  denosumab (PROLIA) 60 MG/ML SOSY injection Inject 60 mg into the skin every 6 (six) months.    [provider]  diclofenac sodium (VOLTAREN) 1 % GEL Apply topically every morning.    [provider]  famotidine (PEPCID) 40 MG tablet Take 40 mg by mouth at bedtime as needed for heartburn or indigestion.    [provider]  glucosamine-chondroitin 500-400 MG tablet Take 2 tablets by mouth daily.     [provider]  lisinopril (ZESTRIL) 10 MG tablet Take 10 mg by mouth daily.    [provider]  meloxicam (MOBIC) 7.5 MG tablet Take 7.5 mg by mouth daily.    [provider]  Multiple Vitamins-Minerals (CENTRUM SILVER PO) Take 1 tablet by mouth daily.    [provider]  Multiple Vitamins-Minerals (ICAPS MV PO) Take 1 tablet by mouth daily.    [provider]  Polyethyl Glycol-Propyl Glycol (SYSTANE OP) Apply 1 drop to eye at bedtime.     [provider]  polyethylene glycol (MIRALAX / GLYCOLAX) 17 g packet Take 17 g by mouth daily.    [provider]  RABEprazole (ACIPHEX) 20 MG tablet Take 1 tablet by mouth once daily Patient not taking: Reported on 06/10/2020 09/06/19   Jackquline Denmark, MD  Wheat Dextrin (BENEFIBER PO) Take 1 tablet by mouth daily.    [provider]      Allergies    Aspirin, Macrobid [nitrofurantoin macrocrystal], Motrin [ibuprofen], Polyethylene glycol, Prednisone, Red yeast rice [cholestin], Sulfa antibiotics, and Codeine    Review of Systems   Review of Systems  Constitutional:  Negative for chills and fever.  Eyes:  Negative for visual disturbance.  Respiratory:  Negative for shortness of breath.    Cardiovascular:  Negative for chest pain.  Gastrointestinal:  Positive for abdominal distention, abdominal pain, nausea and vomiting. Negative for anal bleeding, blood in stool, constipation and diarrhea.  Genitourinary:  Positive for frequency. Negative for difficulty urinating, dysuria, flank pain, genital sores, hematuria, urgency, vaginal bleeding, vaginal discharge and vaginal pain.  Musculoskeletal:  Negative for back pain and neck pain.  Skin:  Negative for color change and rash.  Neurological:  Negative for dizziness, syncope, light-headedness and headaches.  Psychiatric/Behavioral:  Negative for confusion.     Physical Exam Updated Vital Signs BP (!) 193/83 (BP Location: Left Arm)   Pulse 64   Temp 98.4 F (36.9 C) (Oral)   Resp 18   Wt 44.9 kg   SpO2 97%   BMI 20.34 kg/m  Physical Exam Vitals and nursing note reviewed.  Constitutional:      General: She is not in acute distress.    Appearance: She is not ill-appearing, toxic-appearing or diaphoretic.  HENT:     Head: Normocephalic.  Eyes:     General: No scleral icterus.       Right eye: No discharge.        Left eye: No discharge.  Cardiovascular:     Rate and Rhythm: Normal rate.  Pulmonary:     Effort: Pulmonary effort is normal. No tachypnea, bradypnea or respiratory distress.     Breath sounds: Normal breath sounds. No stridor.  Abdominal:     General: Abdomen is flat. Bowel sounds are decreased. There is distension. There are no signs of injury.     Palpations: There is no mass or pulsatile mass.     Tenderness: There is abdominal tenderness in the periumbilical area, left upper quadrant and left lower quadrant. There is no right CVA tenderness, left CVA tenderness, guarding or rebound.     Hernia: There is no hernia in the umbilical area or ventral area.  Musculoskeletal:     Right lower leg: Normal.     Left lower leg: Normal.  Skin:    General: Skin is warm and dry.  Neurological:     General: No  focal deficit present.     Mental Status: She is alert.  Psychiatric:        Behavior: Behavior is cooperative.     ED Results / Procedures / Treatments   Labs (all labs ordered are listed, but only abnormal results are displayed) Labs Reviewed  COMPREHENSIVE METABOLIC PANEL - Abnormal; Notable for the following components:      Result Value   Potassium 3.4 (*)    Glucose, Bld 168 (*)    All other components within normal limits  CBC - Abnormal; Notable for the following components:   WBC 18.0 (*)    All other components within normal limits  URINALYSIS, ROUTINE W REFLEX MICROSCOPIC - Abnormal; Notable for the following components:   APPearance CLOUDY (*)    Protein, ur 30 (*)    Bacteria, UA FEW (*)    All other components within normal limits  URINE  CULTURE  LIPASE, BLOOD    EKG None  Radiology CT ABDOMEN PELVIS W CONTRAST  Result Date: 10/17/2021 CLINICAL DATA:  Left-sided abdominal pain EXAM: CT ABDOMEN AND PELVIS WITH CONTRAST TECHNIQUE: Multidetector CT imaging of the abdomen and pelvis was performed using the standard protocol following bolus administration of intravenous contrast. RADIATION DOSE REDUCTION: This exam was performed according to the departmental dose-optimization program which includes automated exposure control, adjustment of the mA and/or kV according to patient size and/or use of iterative reconstruction technique. CONTRAST:  41m OMNIPAQUE IOHEXOL 300 MG/ML  SOLN COMPARISON:  12/13/2018 FINDINGS: Lower chest: No acute abnormality. Hepatobiliary: No focal liver abnormality is seen. No gallstones, gallbladder wall thickening, or biliary dilatation. Pancreas: Unremarkable. No pancreatic ductal dilatation or surrounding inflammatory changes. Spleen: Normal in size without focal abnormality. Adrenals/Urinary Tract: Adrenal glands are within normal limits. Kidneys demonstrate a normal enhancement pattern bilaterally. No renal calculi or obstructive changes are  seen. Bladder is well distended. Stomach/Bowel: Fecal material is noted throughout the colon consistent with a degree of constipation. The appendix is not well visualized and may have been surgically removed. Stomach is decompressed. Multiple fluid-filled loops of small bowel are identified in the mid abdomen which extend to a right inguinal hernia containing multiple loops of small bowel. Vascular/Lymphatic: Aortic atherosclerosis. No enlarged abdominal or pelvic lymph nodes. Reproductive: Status post hysterectomy. No adnexal masses. Other: Right inguinal hernia as described above. No abdominopelvic ascites. Musculoskeletal: Degenerative changes of left hip joint are seen. No acute bony abnormality is noted. IMPRESSION: Partial small bowel obstruction secondary to herniated loops of small bowel in the right inguinal hernia. Mild colonic constipation. Electronically Signed   By: MInez CatalinaM.D.   On: 10/17/2021 22:38    Procedures Procedures    Medications Ordered in ED Medications  ondansetron (ZOFRAN-ODT) disintegrating tablet 4 mg (4 mg Oral Given 10/17/21 1712)    ED Course/ Medical Decision Making/ A&P Clinical Course as of 10/17/21 2354  Fri Oct 17, 2021  2305 I spoke to Dr. CWindle Guardwith general surgery team.  She recommended admission to hospitalist team due to patient's ongoing pain and elevated white blood cell count.  Patient will be seen by general surgery team in the morning.  Can hold NG tube at this time however patient starts having vomiting recommendation is to place NG tube. [PB]  2353 I spoke to Dr. HVelia Meyerwho will see the patient for admission [PB]    Clinical Course User Index [PB] BLoni Beckwith PA-C                           Medical Decision Making Amount and/or Complexity of Data Reviewed Labs: ordered.  Risk Prescription drug management. Decision regarding hospitalization.   Alert 88year old female in no acute distress, nontoxic-appearing.  Presents the  emergency department complaining of abdominal pain, nausea, and vomiting.  Information is obtained from patient and patient's sons at bedside.  I reviewed patient's past medical records including previous provider notes, labs, and imaging.  Due to patient's reports of abdominal pain differential diagnosis includes but is not limited to diverticulitis, small bowel obstruction, pyelonephritis.  CT imaging of abdomen pelvis were obtained for further evaluation.  Abdominal labs ordered.  Patient given morphine and Zofran for pain management.  I personally viewed and interpreted patient's lab results.  Pertinent findings include: -Leukocytosis 18 -Potassium 3.4 -AST, ALT, alk phos, total bili, and lipase all within normal limits. -UA shows no  signs of infection  I personally viewed interpret patient's CT imaging.  Agree with radiology interpretation of partial small bowel obstruction secondary to herniated loops of small bowel in the right inguinal hernia.  On reassessment patient is noted to have right inguinal hernia.  Hernia is easily reduced.  Due to findings of CT scan we will reach out to on-call general surgeon.  I spoke with Dr. Windle Guard with general surgery, please see above for further information on this conversation.  Plan is to admit patient for observation to hospitalist team.  I spoke with Dr.Howerter who will see the patient for admission.          Final Clinical Impression(s) / ED Diagnoses Final diagnoses:  SBO (small bowel obstruction) [facility])    Rx / DC Orders ED Discharge Orders     None         Dyann Ruddle 10/17/21 2356    Dorie Rank, MD 10/19/21 [phone]

## 2021-10-17 NOTE — H&P (Signed)
History and Physical    PLEASE NOTE THAT DRAGON DICTATION SOFTWARE WAS USED IN THE CONSTRUCTION OF THIS NOTE.   Jodi Jefferson ZJI:[id number] DOB: Mar 16, 1932 DOA: 10/17/2021  PCP: Jodi Sheriff, MD *** Patient coming from: home ***  I have personally briefly reviewed patient's old medical records in [place]  Chief Complaint: ***  HPI: Jodi Jefferson is a 88 y.o. female with medical history significant for *** who is admitted to [facility] on 10/17/2021 with *** after presenting from home*** to [facility] ED complaining of ***.    ***    ***SOB: Denies any associated orthopnea, PND, or new onset peripheral edema. No recent chest pain, diaphoresis, palpitations, N/V, pre-syncope, or syncope. Not associated with any recent cough, wheezing, hemoptysis, new lower extremity erythema, or calf tenderness. Denies any recent trauma, travel, surgical procedures, or periods of prolonged diminished ambulatory status. No recent melena or hematochezia.   Denies any associated subjective fever, chills, rigors, or generalized myalgias. No recent headache, neck stiffness, rhinitis, rhinorrhea, sore throat, abdominal pain, diarrhea, or rash. No known recent COVID-19 exposures. Denies dysuria, gross hematuria, or change in urinary urgency/frequency.  ***   ***misc/infectious: Denies any subjective fever, chills, rigors, or generalized myalgias. Denies any recent headache, neck stiffness, rhinitis, rhinorrhea, sore throat, sob, wheezing, cough, nausea, vomiting, abdominal pain, diarrhea, or rash. No recent traveling or known COVID-19 exposures. Denies dysuria, gross hematuria, or change in urinary urgency/frequency.  Denies any recent chest pain, diaphoresis, or palpitations. ***    ED Course:  Vital signs in the ED were notable for the following: ***  Labs were notable for the following: ***  Imaging and additional notable ED work-up: ***  While in the ED, the  following were administered: ***  Subsequently, the patient was admitted  ***  ***red    Review of Systems: As per HPI otherwise 10 point review of systems negative.   Past Medical History:  Diagnosis Date   Abnormal EKG 01/26/2017   Allergy    Anxiety 01/26/2017   Arthritis 01/26/2017   Benign essential hypertension 01/26/2017   Cataract    Chest pain 01/26/2017   Closed pelvic fracture ([place]) 01/26/2017   GERD (gastroesophageal reflux disease) 01/26/2017   Hyperlipemia, mixed 01/26/2017   Macular degeneration 01/26/2017   Osteoporosis 01/26/2017    Past Surgical History:  Procedure Laterality Date   ABDOMINAL HYSTERECTOMY     APPENDECTOMY     COLONOSCOPY     many years ago    ESOPHAGOGASTRODUODENOSCOPY  2010   REFRACTIVE SURGERY     TONSILLECTOMY      Social History:  reports that she has never smoked. She has never used smokeless tobacco. She reports that she does not drink alcohol and does not use drugs.   Allergies  Allergen Reactions   Aspirin    Macrobid [Nitrofurantoin Macrocrystal]    Motrin [Ibuprofen] Other (See Comments)    GI intolerance   Polyethylene Glycol     Facial flushing and tingling   Prednisone    Red Yeast Rice [Cholestin]    Sulfa Antibiotics    Codeine Nausea Only    Family History  Problem Relation Age of Onset   Heart attack Father    Prostate cancer Paternal Grandfather    Colon cancer Neg Hx    Esophageal cancer Neg Hx    Rectal cancer Neg Hx    Stomach cancer Neg Hx     Family history reviewed and not pertinent ***  Prior to Admission medications   Medication Sig Start Date End Date Taking? Authorizing Provider  acetaminophen (TYLENOL) 650 MG CR tablet Take 650 mg by mouth as needed.     [provider]  calcium-vitamin D (OSCAL WITH D) 500-200 MG-UNIT tablet Take 1 tablet by mouth daily.    [provider]  Cholecalciferol 25 MCG (1000 UT) tablet Take by mouth daily.     [provider]   denosumab (PROLIA) 60 MG/ML SOSY injection Inject 60 mg into the skin every 6 (six) months.    [provider]  diclofenac sodium (VOLTAREN) 1 % GEL Apply topically every morning.    [provider]  famotidine (PEPCID) 40 MG tablet Take 40 mg by mouth at bedtime as needed for heartburn or indigestion.    [provider]  glucosamine-chondroitin 500-400 MG tablet Take 2 tablets by mouth daily.     [provider]  lisinopril (ZESTRIL) 10 MG tablet Take 10 mg by mouth daily.    [provider]  meloxicam (MOBIC) 7.5 MG tablet Take 7.5 mg by mouth daily.    [provider]  Multiple Vitamins-Minerals (CENTRUM SILVER PO) Take 1 tablet by mouth daily.    [provider]  Multiple Vitamins-Minerals (ICAPS MV PO) Take 1 tablet by mouth daily.    [provider]  Polyethyl Glycol-Propyl Glycol (SYSTANE OP) Apply 1 drop to eye at bedtime.     [provider]  polyethylene glycol (MIRALAX / GLYCOLAX) 17 g packet Take 17 g by mouth daily.    [provider]  RABEprazole (ACIPHEX) 20 MG tablet Take 1 tablet by mouth once daily Patient not taking: Reported on 06/10/2020 09/06/19   Jodi Denmark, MD  Wheat Dextrin (BENEFIBER PO) Take 1 tablet by mouth daily.    [provider]     Objective    Physical Exam: Vitals:   10/17/21 2118 10/17/21 2145 10/17/21 2200 10/17/21 2330  BP: (!) 175/86 (!) 152/85 (!) 143/71 (!) 156/88  Pulse: 65 63 (!) 59 67  Resp: (!) '25 20 19 19  '$ Temp:      TempSrc:      SpO2: 100% 99% 96% 99%  Weight:        General: appears to be stated age; alert, oriented Skin: warm, dry, no rash Head:  AT/[place] Mouth:  Oral mucosa membranes appear moist, normal dentition Neck: supple; trachea midline Heart:  RRR; did not appreciate any M/R/G Lungs: CTAB, did not appreciate any wheezes, rales, or rhonchi Abdomen: + BS; soft, ND, NT Vascular: 2+ pedal pulses b/l; 2+ radial pulses  b/l Extremities: no peripheral edema, no muscle wasting Neuro: strength and sensation intact in upper and lower extremities b/l ***   *** Neuro: 5/5 strength of the proximal and distal flexors and extensors of the upper and lower extremities bilaterally; sensation intact in upper and lower extremities b/l; cranial nerves II through XII grossly intact; no pronator drift; no evidence suggestive of slurred speech, dysarthria, or facial droop; Normal muscle tone. No tremors.  *** Neuro: In the setting of the patient's current mental status and associated inability to follow instructions, unable to perform full neurologic exam at this time.  As such, assessment of strength, sensation, and cranial nerves is limited at this time. Patient noted to spontaneously move all 4 extremities. No tremors.  ***    Labs on Admission: I have personally reviewed following labs and imaging studies  CBC: Recent Labs  Lab 10/17/21 1714  WBC 18.0*  HGB 13.3  HCT 41.1  MCV 96.0  PLT 354   Basic Metabolic Panel: Recent Labs  Lab 10/17/21 1714  NA 140  K 3.4*  CL 103  CO2 28  GLUCOSE 168*  BUN 15  CREATININE 0.59  CALCIUM 10.0   GFR: CrCl cannot be calculated (Unknown ideal weight.). Liver Function Tests: Recent Labs  Lab 10/17/21 1714  AST 22  ALT 18  ALKPHOS 61  BILITOT 0.6  PROT 7.2  ALBUMIN 4.0   Recent Labs  Lab 10/17/21 1714  LIPASE 30   No results for input(s): "AMMONIA" in the last 168 hours. Coagulation Profile: No results for input(s): "INR", "PROTIME" in the last 168 hours. Cardiac Enzymes: No results for input(s): "CKTOTAL", "CKMB", "CKMBINDEX", "TROPONINI" in the last 168 hours. BNP (last 3 results) No results for input(s): "PROBNP" in the last 8760 hours. HbA1C: No results for input(s): "HGBA1C" in the last 72 hours. CBG: No results for input(s): "GLUCAP" in the last 168 hours. Lipid Profile: No results for input(s): "CHOL", "HDL", "LDLCALC", "TRIG", "CHOLHDL",  "LDLDIRECT" in the last 72 hours. Thyroid Function Tests: No results for input(s): "TSH", "T4TOTAL", "FREET4", "T3FREE", "THYROIDAB" in the last 72 hours. Anemia Panel: No results for input(s): "VITAMINB12", "FOLATE", "FERRITIN", "TIBC", "IRON", "RETICCTPCT" in the last 72 hours. Urine analysis:    Component Value Date/Time   COLORURINE YELLOW 10/17/2021 2211   APPEARANCEUR CLOUDY (A) 10/17/2021 2211   LABSPEC 1.015 10/17/2021 2211   PHURINE 7.0 10/17/2021 2211   GLUCOSEU NEGATIVE 10/17/2021 2211   HGBUR NEGATIVE 10/17/2021 2211   Hissop NEGATIVE 10/17/2021 2211   KETONESUR NEGATIVE 10/17/2021 2211   PROTEINUR 30 (A) 10/17/2021 2211   NITRITE NEGATIVE 10/17/2021 2211   LEUKOCYTESUR NEGATIVE 10/17/2021 2211    Radiological Exams on Admission: CT ABDOMEN PELVIS W CONTRAST  Result Date: 10/17/2021 CLINICAL DATA:  Left-sided abdominal pain EXAM: CT ABDOMEN AND PELVIS WITH CONTRAST TECHNIQUE: Multidetector CT imaging of the abdomen and pelvis was performed using the standard protocol following bolus administration of intravenous contrast. RADIATION DOSE REDUCTION: This exam was performed according to the departmental dose-optimization program which includes automated exposure control, adjustment of the mA and/or kV according to patient size and/or use of iterative reconstruction technique. CONTRAST:  48m OMNIPAQUE IOHEXOL 300 MG/ML  SOLN COMPARISON:  12/13/2018 FINDINGS: Lower chest: No acute abnormality. Hepatobiliary: No focal liver abnormality is seen. No gallstones, gallbladder wall thickening, or biliary dilatation. Pancreas: Unremarkable. No pancreatic ductal dilatation or surrounding inflammatory changes. Spleen: Normal in size without focal abnormality. Adrenals/Urinary Tract: Adrenal glands are within normal limits. Kidneys demonstrate a normal enhancement pattern bilaterally. No renal calculi or obstructive changes are seen. Bladder is well distended. Stomach/Bowel: Fecal material  is noted throughout the colon consistent with a degree of constipation. The appendix is not well visualized and may have been surgically removed. Stomach is decompressed. Multiple fluid-filled loops of small bowel are identified in the mid abdomen which extend to a right inguinal hernia containing multiple loops of small bowel. Vascular/Lymphatic: Aortic atherosclerosis. No enlarged abdominal or pelvic lymph nodes. Reproductive: Status post hysterectomy. No adnexal masses. Other: Right inguinal hernia as described above. No abdominopelvic ascites. Musculoskeletal: Degenerative changes of left hip joint are seen. No acute bony abnormality is noted. IMPRESSION: Partial small bowel obstruction secondary to herniated loops of small bowel in the right inguinal hernia. Mild colonic constipation. Electronically Signed   By: MInez CatalinaM.D.   On: 10/17/2021 22:38     EKG: Independently reviewed,  with result as described above. ***   Assessment/Plan   Principal Problem:   SBO (small bowel obstruction) (HCC)   ***    ***     ***  DVT prophylaxis: SCD's ***  Code Status: Full code*** Family Communication: none*** Disposition Plan: Per Rounding Team Consults called: none***;  Admission status: ***  Warrants inpatient status on basis of ***   PLEASE NOTE THAT DRAGON DICTATION SOFTWARE WAS USED IN THE CONSTRUCTION OF THIS NOTE.   Hawkins DO [facility]  From [place]   10/17/2021, 11:59 PM   ***

## 2021-10-17 NOTE — ED Triage Notes (Addendum)
Pt arrives via [facility] from home with c/o LUQ and LLQ abd pain with abd distention associated with lack of appetite and vomiting 2-3 times today. Abd pain started 3 days ago. BP-174/82, HR-60, RR-12

## 2021-10-17 NOTE — ED Notes (Signed)
Patient transported to CT 

## 2021-10-18 ENCOUNTER — Encounter (HOSPITAL_COMMUNITY): Payer: Self-pay | Admitting: Internal Medicine

## 2021-10-18 DIAGNOSIS — E876 Hypokalemia: Secondary | ICD-10-CM

## 2021-10-18 DIAGNOSIS — D72829 Elevated white blood cell count, unspecified: Secondary | ICD-10-CM

## 2021-10-18 DIAGNOSIS — K56609 Unspecified intestinal obstruction, unspecified as to partial versus complete obstruction: Secondary | ICD-10-CM | POA: Diagnosis not present

## 2021-10-18 HISTORY — DX: Hypokalemia: E87.6

## 2021-10-18 HISTORY — DX: Elevated white blood cell count, unspecified: D72.829

## 2021-10-18 LAB — COMPREHENSIVE METABOLIC PANEL
ALT: 17 U/L (ref 0–44)
AST: 23 U/L (ref 15–41)
Albumin: 3.3 g/dL — ABNORMAL LOW (ref 3.5–5.0)
Alkaline Phosphatase: 51 U/L (ref 38–126)
Anion gap: 5 (ref 5–15)
BUN: 10 mg/dL (ref 8–23)
CO2: 27 mmol/L (ref 22–32)
Calcium: 9.4 mg/dL (ref 8.9–10.3)
Chloride: 108 mmol/L (ref 98–111)
Creatinine, Ser: 0.65 mg/dL (ref 0.44–1.00)
GFR, Estimated: >60 mL/min (ref >60)
Glucose, Bld: 102 mg/dL — ABNORMAL HIGH (ref 70–99)
Potassium: 4 mmol/L (ref 3.5–5.1)
Sodium: 140 mmol/L (ref 135–145)
Total Bilirubin: 0.6 mg/dL (ref 0.3–1.2)
Total Protein: 6.1 g/dL — ABNORMAL LOW (ref 6.5–8.1)

## 2021-10-18 LAB — CBC WITH DIFFERENTIAL/PLATELET
Abs Immature Granulocytes: 0.05 10*3/uL (ref 0.00–0.07)
Basophils Absolute: 0.1 10*3/uL (ref 0.0–0.1)
Basophils Relative: 0 %
Eosinophils Absolute: 0 10*3/uL (ref 0.0–0.5)
Eosinophils Relative: 0 %
HCT: 36.2 % (ref 36.0–46.0)
Hemoglobin: 11.4 g/dL — ABNORMAL LOW (ref 12.0–15.0)
Immature Granulocytes: 0 %
Lymphocytes Relative: 14 %
Lymphs Abs: 1.7 10*3/uL (ref 0.7–4.0)
MCH: 30.7 pg (ref 26.0–34.0)
MCHC: 31.5 g/dL (ref 30.0–36.0)
MCV: 97.6 fL (ref 80.0–100.0)
Monocytes Absolute: 1.1 10*3/uL — ABNORMAL HIGH (ref 0.1–1.0)
Monocytes Relative: 8 %
Neutro Abs: 9.6 10*3/uL — ABNORMAL HIGH (ref 1.7–7.7)
Neutrophils Relative %: 78 %
Platelets: 255 10*3/uL (ref 150–400)
RBC: 3.71 MIL/uL — ABNORMAL LOW (ref 3.87–5.11)
RDW: 13 % (ref 11.5–15.5)
WBC: 12.5 10*3/uL — ABNORMAL HIGH (ref 4.0–10.5)
nRBC: 0 % (ref 0.0–0.2)

## 2021-10-18 LAB — MAGNESIUM
Magnesium: 2.3 mg/dL (ref 1.7–2.4)
Magnesium: 2.4 mg/dL (ref 1.7–2.4)

## 2021-10-18 MED ORDER — BISACODYL 10 MG RE SUPP
10.0000 mg | Freq: Every day | RECTAL | Status: DC
Start: 2021-10-18 — End: 2021-10-18

## 2021-10-18 MED ORDER — SENNA 8.6 MG PO TABS
1.0000 | ORAL_TABLET | Freq: Every day | ORAL | Status: DC | PRN
Start: 2021-10-18 — End: 2021-10-20
  Administered 2021-10-18: 8.6 mg via ORAL
  Filled 2021-10-18: qty 1

## 2021-10-18 MED ORDER — FAMOTIDINE 10 MG PO TABS
10.0000 mg | ORAL_TABLET | Freq: Once | ORAL | Status: AC | PRN
Start: 2021-10-18 — End: 2021-10-18
  Administered 2021-10-18: 10 mg via ORAL
  Filled 2021-10-18: qty 1

## 2021-10-18 MED ORDER — SORBITOL 70 % SOLN
960.0000 mL | TOPICAL_OIL | Freq: Once | ORAL | Status: DC
  Filled 2021-10-18 (×2): qty 473

## 2021-10-18 MED ORDER — BISACODYL 10 MG RE SUPP
10.0000 mg | Freq: Every day | RECTAL | Status: DC | PRN
  Administered 2021-10-18 – 2021-10-21 (×2): 10 mg via RECTAL
  Filled 2021-10-18 (×2): qty 1

## 2021-10-18 NOTE — ED Notes (Signed)
Pt had a moderate sized bowel movement. Solid stool

## 2021-10-18 NOTE — Assessment & Plan Note (Signed)
  #)   Leukocytosis: Presenting CBC reflects mildly elevated wbc count of 18,000, which is suspected to be inflammatory in nature in the setting of presenting SBO, with an additional element of hemoconcentration in the context of dehydration, with remainder of CBC results also suggestive of corresponding hemoconcentration.  No clinical evidence to suggest underlying infectious process at this time, including no e/o bowel perforation or abscess.  Additionally, presenting urinalysis not consistent with UTI.  In the absence of suspected underlying infxn, criteria for sepsis not currently met. Consequently, will refrain from initiation of abx at this time.    Plan: Repeat CBC with diff in the AM.  IV fluids, as above.

## 2021-10-18 NOTE — Assessment & Plan Note (Signed)
 #)   Hypokalemia: Presenting serum potassium level noted to be 3.4, with likely contributions from increased GI losses in the form of recent nausea/vomiting, as above.  Plan: Continue CIWA Ringer's, as above.  Advance rate as well.  Repeat CMP in the morning.  Monitor on symmetry.

## 2021-10-18 NOTE — ED Notes (Signed)
Pt ambulated to toilet with walker and 1 assistance. She had small BM some formed and some liquid. Upon assisting with wiping her noticed protrusion of soft tissue from her vagina. MD informed.

## 2021-10-18 NOTE — Assessment & Plan Note (Signed)
 #)   Small bowel obstruction: dx on the basis of acute onset abdominal pain associated with nausea/vomiting associated with mild abdominal distention and with CT abdomen/pelvis suggestive of SBO and proximity to right inguinal hernia, which has been easily reducible, but felt to potentially be a contributing factor to her SBO.  This is in the absence of any evidence of perforation or abscess.  In the context of a history of multiple prior abdominal surgeries, differential also includes postoperative adhesions. No evidence of peritoneal signs on initial physical exam. Will initiate conservative measures, and observe for return of normal bowel function, as described below.  EDP discussed the patient's case with on-call general surgery, Dr. Windle Guard, Who recommended admission to the hospital service for further evaluation management of suspected partial small bowel obstruction.  Dr. Windle Guard Conveyed that she will formally consult and see the patient in the morning.  She recommends refraining from placement of NGT unless the patient develops worsening nausea/vomiting.    Plan: Strict NPO. LR '@75'$  cc/h cc per hour. Prn IV morphine. Prn IV Zofran.  General surgery consulted, as above.  Refraining from NGT at this time further recommendations.  SCD's for DVT prophylaxis in case the patient should ultimately require surgical intervention.  Recheck CMP and CBC in the morning.

## 2021-10-18 NOTE — Progress Notes (Signed)
[facility] Progress Note     Subjective: CC:   Denies abdominal pain, nausea, or vomiting. +flatus. Denies BM yet. Tolerated a warm glass of water and some sips of liquids. Her son and daughter-in-law are at bedside.  Objective: Vital signs in last 24 hours: Temp:  [98.2 F (36.8 C)-98.4 F (36.9 C)] 98.4 F (36.9 C) (07/01 0911) Pulse Rate:  [56-67] 60 (07/01 0911) Resp:  [11-25] 15 (07/01 0911) BP: (128-193)/(68-88) 143/80 (07/01 0911) SpO2:  [92 %-100 %] 99 % (07/01 0911) Weight:  [44.9 kg] 44.9 kg (06/30 1705)    Intake/Output from previous day: 06/30 0701 - 07/01 0700 In: 1000 [IV Piggyback:1000] Out: -  Intake/Output this shift: No intake/output data recorded.  PE: Gen:  Alert, NAD, pleasant Card:  Regular rate and rhythm Pulm:  Normal effort Abd: Soft, non-tender, mild distention, no palpable RIH, no organomegaly Skin: warm and dry, no rashes  Psych: A&Ox3   Lab Results:  Recent Labs    10/17/21 1714 10/18/21 0417  WBC 18.0* 12.5*  HGB 13.3 11.4*  HCT 41.1 36.2  PLT 272 255   BMET Recent Labs    10/17/21 1714 10/18/21 0417  NA 140 140  K 3.4* 4.0  CL 103 108  CO2 28 27  GLUCOSE 168* 102*  BUN 15 10  CREATININE 0.59 0.65  CALCIUM 10.0 9.4   PT/INR No results for input(s): "LABPROT", "INR" in the last 72 hours. CMP     Component Value Date/Time   NA 140 10/18/2021 0417   K 4.0 10/18/2021 0417   CL 108 10/18/2021 0417   CO2 27 10/18/2021 0417   GLUCOSE 102 (H) 10/18/2021 0417   BUN 10 10/18/2021 0417   CREATININE 0.65 10/18/2021 0417   CALCIUM 9.4 10/18/2021 0417   PROT 6.1 (L) 10/18/2021 0417   ALBUMIN 3.3 (L) 10/18/2021 0417   AST 23 10/18/2021 0417   ALT 17 10/18/2021 0417   ALKPHOS 51 10/18/2021 0417   BILITOT 0.6 10/18/2021 0417   GFRNONAA >60 10/18/2021 0417   Lipase     Component Value Date/Time   LIPASE 30 10/17/2021 1714       Studies/Results: CT ABDOMEN PELVIS W CONTRAST  Result Date:  10/17/2021 CLINICAL DATA:  Left-sided abdominal pain EXAM: CT ABDOMEN AND PELVIS WITH CONTRAST TECHNIQUE: Multidetector CT imaging of the abdomen and pelvis was performed using the standard protocol following bolus administration of intravenous contrast. RADIATION DOSE REDUCTION: This exam was performed according to the departmental dose-optimization program which includes automated exposure control, adjustment of the mA and/or kV according to patient size and/or use of iterative reconstruction technique. CONTRAST:  2m OMNIPAQUE IOHEXOL 300 MG/ML  SOLN COMPARISON:  12/13/2018 FINDINGS: Lower chest: No acute abnormality. Hepatobiliary: No focal liver abnormality is seen. No gallstones, gallbladder wall thickening, or biliary dilatation. Pancreas: Unremarkable. No pancreatic ductal dilatation or surrounding inflammatory changes. Spleen: Normal in size without focal abnormality. Adrenals/Urinary Tract: Adrenal glands are within normal limits. Kidneys demonstrate a normal enhancement pattern bilaterally. No renal calculi or obstructive changes are seen. Bladder is well distended. Stomach/Bowel: Fecal material is noted throughout the colon consistent with a degree of constipation. The appendix is not well visualized and may have been surgically removed. Stomach is decompressed. Multiple fluid-filled loops of small bowel are identified in the mid abdomen which extend to a right inguinal hernia containing multiple loops of small bowel. Vascular/Lymphatic: Aortic atherosclerosis. No enlarged abdominal or pelvic lymph nodes. Reproductive: Status post hysterectomy. No adnexal masses. Other:  Right inguinal hernia as described above. No abdominopelvic ascites. Musculoskeletal: Degenerative changes of left hip joint are seen. No acute bony abnormality is noted. IMPRESSION: Partial small bowel obstruction secondary to herniated loops of small bowel in the right inguinal hernia. Mild colonic constipation. Electronically Signed    By: Inez Catalina M.D.   On: 10/17/2021 22:38    Anti-infectives: Anti-infectives (From admission, onward)    None       Assessment/Plan SBO due to right inguinal hernia  constipation - RIH reduced in ED By EDPA. Remains reduced during my exam. No sxs of SBO during my exam. Patient is having a lot of flatus and has tolerated a warm glass of water and sips of PO without nausea or emesis. She denies BM yet. She got a suppository. She has not gotten an enema yet . No emergent surgical needs.  - advance to FLD and await further bowel function   LOS: 1 day   I reviewed nursing notes, hospitalist notes, last 24 h vitals and pain scores, last 48 h intake and output, last 24 h labs and trends, and last 24 h imaging results.    Obie Dredge, PA-C [facility] Please see Amion for pager number during day hours 7:00am-4:30pm

## 2021-10-18 NOTE — Consult Note (Signed)
Surgical Evaluation Requesting provider: Debbe Mounts PA-C  Chief Complaint: Abdominal pain  HPI: 88 year old woman with history of hypertension, GERD, anxiety, osteoporosis, arthritis, previous appendectomy and previous abdominal hysterectomy who presented yesterday afternoon with left-sided abdominal pain, abdominal distention, anorexia and multiple episodes of emesis.  Her pain started 3 days prior.  It has been fairly constant and progressively worsening.  On work-up in the ER had a CT scan showing partial small bowel obstruction related to right inguinal hernia.  This is subsequently been easily reduced.  She reports her last bowel movement was yesterday morning. At the time of consult she reports that her pain is resolved.  Allergies  Allergen Reactions   Aspirin    Macrobid [Nitrofurantoin Macrocrystal]    Motrin [Ibuprofen] Other (See Comments)    GI intolerance   Polyethylene Glycol     Facial flushing and tingling   Prednisone    Red Yeast Rice [Cholestin]    Sulfa Antibiotics    Codeine Nausea Only    Past Medical History:  Diagnosis Date   Abnormal EKG 01/26/2017   Allergy    Anxiety 01/26/2017   Arthritis 01/26/2017   Benign essential hypertension 01/26/2017   Cataract    Chest pain 01/26/2017   Closed pelvic fracture ([place]) 01/26/2017   GERD (gastroesophageal reflux disease) 01/26/2017   Hyperlipemia, mixed 01/26/2017   Macular degeneration 01/26/2017   Osteoporosis 01/26/2017    Past Surgical History:  Procedure Laterality Date   ABDOMINAL HYSTERECTOMY     APPENDECTOMY     COLONOSCOPY     many years ago    ESOPHAGOGASTRODUODENOSCOPY  2010   REFRACTIVE SURGERY     TONSILLECTOMY      Family History  Problem Relation Age of Onset   Heart attack Father    Prostate cancer Paternal Grandfather    Colon cancer Neg Hx    Esophageal cancer Neg Hx    Rectal cancer Neg Hx    Stomach cancer Neg Hx     Social History   Socioeconomic History   Marital status:  Married    Spouse name: Not on file   Number of children: Not on file   Years of education: Not on file   Highest education level: Not on file  Occupational History   Not on file  Tobacco Use   Smoking status: Never   Smokeless tobacco: Never  Vaping Use   Vaping Use: Never used  Substance and Sexual Activity   Alcohol use: No   Drug use: No   Sexual activity: Not on file  Other Topics Concern   Not on file  Social History Narrative   Not on file   Social Determinants of Health   Financial Resource Strain: Not on file  Food Insecurity: Not on file  Transportation Needs: Not on file  Physical Activity: Not on file  Stress: Not on file  Social Connections: Not on file    No current facility-administered medications on file prior to encounter.   Current Outpatient Medications on File Prior to Encounter  Medication Sig Dispense Refill   acetaminophen (TYLENOL) 650 MG CR tablet Take 650 mg by mouth as needed.      calcium-vitamin D (OSCAL WITH D) 500-200 MG-UNIT tablet Take 1 tablet by mouth daily.     Cholecalciferol 25 MCG (1000 UT) tablet Take by mouth daily.      denosumab (PROLIA) 60 MG/ML SOSY injection Inject 60 mg into the skin every 6 (six) months.  diclofenac sodium (VOLTAREN) 1 % GEL Apply topically every morning.     famotidine (PEPCID) 40 MG tablet Take 40 mg by mouth at bedtime as needed for heartburn or indigestion.     glucosamine-chondroitin 500-400 MG tablet Take 2 tablets by mouth daily.      lisinopril (ZESTRIL) 10 MG tablet Take 10 mg by mouth daily.     meloxicam (MOBIC) 7.5 MG tablet Take 7.5 mg by mouth daily.     Multiple Vitamins-Minerals (CENTRUM SILVER PO) Take 1 tablet by mouth daily.     Multiple Vitamins-Minerals (ICAPS MV PO) Take 1 tablet by mouth daily.     Polyethyl Glycol-Propyl Glycol (SYSTANE OP) Apply 1 drop to eye at bedtime.      polyethylene glycol (MIRALAX / GLYCOLAX) 17 g packet Take 17 g by mouth daily.     RABEprazole  (ACIPHEX) 20 MG tablet Take 1 tablet by mouth once daily (Patient not taking: Reported on 06/10/2020) 84 tablet 0   Wheat Dextrin (BENEFIBER PO) Take 1 tablet by mouth daily.      Review of Systems: a complete, 10pt review of systems was completed with pertinent positives and negatives as documented in the HPI  Physical Exam: Vitals:   10/17/21 2200 10/17/21 2330  BP: (!) 143/71 (!) 156/88  Pulse: (!) 59 67  Resp: 19 19  Temp:    SpO2: 96% 99%   Gen: A&Ox3, no distress  Eyes: lids and conjunctivae normal, no icterus. Pupils equally round and reactive to light.  Neck: supple without mass or thyromegaly Chest: respiratory effort is normal.  Cardiovascular: RRR with palpable distal pulses, no pedal edema Gastrointestinal: soft, distended, nontender. No mass, hepatomegaly or splenomegaly.  Well-healed low midline scar; right inguinal hernia remains reduced Lymphatic: no lymphadenopathy in the neck or groin Muscoloskeletal: no clubbing or cyanosis of the fingers.  Strength is symmetrical throughout. Neuro: cranial nerves grossly intact.  Sensation intact to light touch diffusely. Psych: appropriate mood and affect, normal insight/judgment intact  Skin: warm and dry      Latest Ref Rng & Units 10/17/2021    5:14 PM 06/21/2018    3:28 PM  CBC  WBC 4.0 - 10.5 K/uL 18.0  8.3   Hemoglobin 12.0 - 15.0 g/dL 13.3  13.1   Hematocrit 36.0 - 46.0 % 41.1  39.4   Platelets 150 - 400 K/uL 272  292.0        Latest Ref Rng & Units 10/17/2021    5:14 PM 06/21/2018    3:28 PM  CMP  Glucose 70 - 99 mg/dL 168  79   BUN 8 - 23 mg/dL 15  22   Creatinine 0.44 - 1.00 mg/dL 0.59  0.46   Sodium 135 - 145 mmol/L 140  139   Potassium 3.5 - 5.1 mmol/L 3.4  5.1   Chloride 98 - 111 mmol/L 103  101   CO2 22 - 32 mmol/L 28  32   Calcium 8.9 - 10.3 mg/dL 10.0  10.7   Total Protein 6.5 - 8.1 g/dL 7.2  7.5   Total Bilirubin 0.3 - 1.2 mg/dL 0.6  0.3   Alkaline Phos 38 - 126 U/L 61  58   AST 15 - 41 U/L 22  19    ALT 0 - 44 U/L 18  18     No results found for: "INR", "PROTIME"  Imaging: CT ABDOMEN PELVIS W CONTRAST  Result Date: 10/17/2021 CLINICAL DATA:  Left-sided abdominal pain EXAM: CT ABDOMEN AND  PELVIS WITH CONTRAST TECHNIQUE: Multidetector CT imaging of the abdomen and pelvis was performed using the standard protocol following bolus administration of intravenous contrast. RADIATION DOSE REDUCTION: This exam was performed according to the departmental dose-optimization program which includes automated exposure control, adjustment of the mA and/or kV according to patient size and/or use of iterative reconstruction technique. CONTRAST:  72m OMNIPAQUE IOHEXOL 300 MG/ML  SOLN COMPARISON:  12/13/2018 FINDINGS: Lower chest: No acute abnormality. Hepatobiliary: No focal liver abnormality is seen. No gallstones, gallbladder wall thickening, or biliary dilatation. Pancreas: Unremarkable. No pancreatic ductal dilatation or surrounding inflammatory changes. Spleen: Normal in size without focal abnormality. Adrenals/Urinary Tract: Adrenal glands are within normal limits. Kidneys demonstrate a normal enhancement pattern bilaterally. No renal calculi or obstructive changes are seen. Bladder is well distended. Stomach/Bowel: Fecal material is noted throughout the colon consistent with a degree of constipation. The appendix is not well visualized and may have been surgically removed. Stomach is decompressed. Multiple fluid-filled loops of small bowel are identified in the mid abdomen which extend to a right inguinal hernia containing multiple loops of small bowel. Vascular/Lymphatic: Aortic atherosclerosis. No enlarged abdominal or pelvic lymph nodes. Reproductive: Status post hysterectomy. No adnexal masses. Other: Right inguinal hernia as described above. No abdominopelvic ascites. Musculoskeletal: Degenerative changes of left hip joint are seen. No acute bony abnormality is noted. IMPRESSION: Partial small bowel  obstruction secondary to herniated loops of small bowel in the right inguinal hernia. Mild colonic constipation. Electronically Signed   By: MInez CatalinaM.D.   On: 10/17/2021 22:38     A/P: Partial small bowel obstruction with history of abdominal distention, abdominal pain, vomiting.  Lab work notable only for leukocytosis to 18.  The right inguinal hernia has been easily reduced and remained so, and at this time her pain is resolved but she remains distended.  Interestingly her pain was mostly on the left side.  On review of her CT scan she does have a fair amount of stool in the rectum and colon.  Have ordered suppository/enema to help clear this out.  Okay to try sips of clears from surgery standpoint.  We will follow.    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

## 2021-10-18 NOTE — Progress Notes (Signed)
PROGRESS NOTE    Jodi Jefferson  ZJQ:[id number] DOB: 05-Feb-1932 DOA: 10/17/2021 PCP: [name], MD    Brief Narrative:  Jodi Jefferson is a 88 y.o. female with medical history significant for essential hypertension,  who is admitted to [facility] on 10/17/2021 with partial small bowel obstruction after presenting from home to [facility] ED complaining of abdominal pain.  Found to have pSBO.     Assessment and Plan: * SBO (small bowel obstruction) (HCC) -mild -already passing gas -sips of clears Ambulate Enema       Leukocytosis -trending down    Hypokalemia -repleted Mg ok     Essential hypertension - hold home lisinopril for now.      DVT prophylaxis: SCDs Start: 10/17/21 2358    Code Status: Full Code Family Communication:  at bedside  Disposition Plan:  Level of care: Telemetry Medical Status is: Inpatient Remains inpatient appropriate because: resolution of pSBO    Consultants:  GS   Subjective: Passing gas but no BM  Objective: Vitals:   10/18/21 0030 10/18/21 0045 10/18/21 0100 10/18/21 0432  BP: 128/78 135/78 138/70 (!) 142/68  Pulse: (!) 57 (!) 56 (!) 58 64  Resp: '11 18 18 16  '$ Temp:      TempSrc:      SpO2: 96% 95% 96% 92%  Weight:        Intake/Output Summary (Last 24 hours) at 10/18/2021 2409 Last data filed at 10/17/2021 2300 Gross per 24 hour  Intake 1000 ml  Output --  Net 1000 ml   Filed Weights   10/17/21 1705  Weight: 44.9 kg    Examination:   General: Appearance:    Well developed, well nourished female in no acute distress   +BS, soft, NT  Lungs:     respirations unlabored  Heart:    Normal heart rate.    MS:   All extremities are intact.    Neurologic:   Awake, alert, oriented x 3. No apparent focal neurological           defect.        Data Reviewed: I have personally reviewed following labs and imaging studies  CBC: Recent Labs  Lab 10/17/21 1714 10/18/21 0417  WBC 18.0*  12.5*  NEUTROABS  --  9.6*  HGB 13.3 11.4*  HCT 41.1 36.2  MCV 96.0 97.6  PLT 272 735   Basic Metabolic Panel: Recent Labs  Lab 10/17/21 1714 10/18/21 0417  NA 140 140  K 3.4* 4.0  CL 103 108  CO2 28 27  GLUCOSE 168* 102*  BUN 15 10  CREATININE 0.59 0.65  CALCIUM 10.0 9.4  MG 2.3 2.4   GFR: CrCl cannot be calculated (Unknown ideal weight.). Liver Function Tests: Recent Labs  Lab 10/17/21 1714 10/18/21 0417  AST 22 23  ALT 18 17  ALKPHOS 61 51  BILITOT 0.6 0.6  PROT 7.2 6.1*  ALBUMIN 4.0 3.3*   Recent Labs  Lab 10/17/21 1714  LIPASE 30   No results for input(s): "AMMONIA" in the last 168 hours. Coagulation Profile: No results for input(s): "INR", "PROTIME" in the last 168 hours. Cardiac Enzymes: No results for input(s): "CKTOTAL", "CKMB", "CKMBINDEX", "TROPONINI" in the last 168 hours. BNP (last 3 results) No results for input(s): "PROBNP" in the last 8760 hours. HbA1C: No results for input(s): "HGBA1C" in the last 72 hours. CBG: No results for input(s): "GLUCAP" in the last 168 hours. Lipid Profile: No results for input(s): "  CHOL", "HDL", "LDLCALC", "TRIG", "CHOLHDL", "LDLDIRECT" in the last 72 hours. Thyroid Function Tests: No results for input(s): "TSH", "T4TOTAL", "FREET4", "T3FREE", "THYROIDAB" in the last 72 hours. Anemia Panel: No results for input(s): "VITAMINB12", "FOLATE", "FERRITIN", "TIBC", "IRON", "RETICCTPCT" in the last 72 hours. Sepsis Labs: No results for input(s): "PROCALCITON", "LATICACIDVEN" in the last 168 hours.  No results found for this or any previous visit (from the past 240 hour(s)).       Radiology Studies: CT ABDOMEN PELVIS W CONTRAST  Result Date: 10/17/2021 CLINICAL DATA:  Left-sided abdominal pain EXAM: CT ABDOMEN AND PELVIS WITH CONTRAST TECHNIQUE: Multidetector CT imaging of the abdomen and pelvis was performed using the standard protocol following bolus administration of intravenous contrast. RADIATION DOSE  REDUCTION: This exam was performed according to the departmental dose-optimization program which includes automated exposure control, adjustment of the mA and/or kV according to patient size and/or use of iterative reconstruction technique. CONTRAST:  27m OMNIPAQUE IOHEXOL 300 MG/ML  SOLN COMPARISON:  12/13/2018 FINDINGS: Lower chest: No acute abnormality. Hepatobiliary: No focal liver abnormality is seen. No gallstones, gallbladder wall thickening, or biliary dilatation. Pancreas: Unremarkable. No pancreatic ductal dilatation or surrounding inflammatory changes. Spleen: Normal in size without focal abnormality. Adrenals/Urinary Tract: Adrenal glands are within normal limits. Kidneys demonstrate a normal enhancement pattern bilaterally. No renal calculi or obstructive changes are seen. Bladder is well distended. Stomach/Bowel: Fecal material is noted throughout the colon consistent with a degree of constipation. The appendix is not well visualized and may have been surgically removed. Stomach is decompressed. Multiple fluid-filled loops of small bowel are identified in the mid abdomen which extend to a right inguinal hernia containing multiple loops of small bowel. Vascular/Lymphatic: Aortic atherosclerosis. No enlarged abdominal or pelvic lymph nodes. Reproductive: Status post hysterectomy. No adnexal masses. Other: Right inguinal hernia as described above. No abdominopelvic ascites. Musculoskeletal: Degenerative changes of left hip joint are seen. No acute bony abnormality is noted. IMPRESSION: Partial small bowel obstruction secondary to herniated loops of small bowel in the right inguinal hernia. Mild colonic constipation. Electronically Signed   By: MInez CatalinaM.D.   On: 10/17/2021 22:38        Scheduled Meds:  bisacodyl  10 mg Rectal Daily   sorbitol, milk of mag, mineral oil, glycerin (SMOG) enema  960 mL Rectal Once   Continuous Infusions:  lactated ringers 75 mL/hr at 10/18/21 0036     LOS:  1 day    Time spent: 45 minutes spent on chart review, discussion with nursing staff, consultants, updating family and interview/physical exam; more than 50% of that time was spent in counseling and/or coordination of care.    JGeradine Girt DO [facility] Available via Epic secure chat 7am-7pm After these hours, please refer to coverage provider listed on [email or web address] 10/18/2021, 8:32 AM

## 2021-10-18 NOTE — Assessment & Plan Note (Signed)
 #)   Essential Hypertension: documented h/o such, with outpatient antihypertensive regimen including lisinopril.  SBP's in the ED today: Normotensive after improvement in mildly elevated initial blood pressures, with this improvement concomitant with improving pain control via receipt of IV morphine, as above.   Plan: Close monitoring of subsequent BP via routine VS. in the setting of current n.p.o. status, will hold home lisinopril for now.

## 2021-10-19 DIAGNOSIS — K56609 Unspecified intestinal obstruction, unspecified as to partial versus complete obstruction: Secondary | ICD-10-CM | POA: Diagnosis not present

## 2021-10-19 LAB — URINE CULTURE: Culture: NO GROWTH

## 2021-10-19 MED ORDER — HYDRALAZINE HCL 20 MG/ML IJ SOLN
10.0000 mg | INTRAMUSCULAR | Status: DC | PRN
Start: 2021-10-19 — End: 2021-10-22
  Administered 2021-10-19 – 2021-10-20 (×2): 10 mg via INTRAVENOUS
  Filled 2021-10-19 (×2): qty 1

## 2021-10-19 MED ORDER — DICLOFENAC SODIUM 1 % EX GEL
4.0000 g | Freq: Four times a day (QID) | CUTANEOUS | Status: DC
Start: 2021-10-19 — End: 2021-10-22
  Administered 2021-10-19 – 2021-10-22 (×7): 4 g via TOPICAL
  Filled 2021-10-19: qty 100

## 2021-10-19 MED ORDER — LISINOPRIL 10 MG PO TABS
10.0000 mg | ORAL_TABLET | Freq: Every evening | ORAL | Status: DC
  Administered 2021-10-19 – 2021-10-21 (×3): 10 mg via ORAL
  Filled 2021-10-19 (×3): qty 1

## 2021-10-19 NOTE — Progress Notes (Signed)
Subjective/Chief Complaint: Pt feels well bowels moving no groin pain    Objective: Vital signs in last 24 hours: Temp:  [97.8 F (36.6 C)-98.6 F (37 C)] 97.8 F (36.6 C) (07/02 0842) Pulse Rate:  [50-60] 60 (07/02 0842) Resp:  [15-24] 16 (07/02 0842) BP: (99-170)/(59-80) 160/73 (07/02 0842) SpO2:  [93 %-100 %] 100 % (07/02 0842) Last BM Date : 10/18/21  Intake/Output from previous day: 07/01 0701 - 07/02 0700 In: 720 [P.O.:120; I.V.:600] Out: -  Intake/Output this shift: No intake/output data recorded.  General appearance: alert and cooperative GI: reduced RIH no LIH soft NT ND    Lab Results:  Recent Labs    10/17/21 1714 10/18/21 0417  WBC 18.0* 12.5*  HGB 13.3 11.4*  HCT 41.1 36.2  PLT 272 255   BMET Recent Labs    10/17/21 1714 10/18/21 0417  NA 140 140  K 3.4* 4.0  CL 103 108  CO2 28 27  GLUCOSE 168* 102*  BUN 15 10  CREATININE 0.59 0.65  CALCIUM 10.0 9.4   PT/INR No results for input(s): "LABPROT", "INR" in the last 72 hours. ABG No results for input(s): "PHART", "HCO3" in the last 72 hours.  Invalid input(s): "PCO2", "PO2"  Studies/Results: CT ABDOMEN PELVIS W CONTRAST  Result Date: 10/17/2021 CLINICAL DATA:  Left-sided abdominal pain EXAM: CT ABDOMEN AND PELVIS WITH CONTRAST TECHNIQUE: Multidetector CT imaging of the abdomen and pelvis was performed using the standard protocol following bolus administration of intravenous contrast. RADIATION DOSE REDUCTION: This exam was performed according to the departmental dose-optimization program which includes automated exposure control, adjustment of the mA and/or kV according to patient size and/or use of iterative reconstruction technique. CONTRAST:  68m OMNIPAQUE IOHEXOL 300 MG/ML  SOLN COMPARISON:  12/13/2018 FINDINGS: Lower chest: No acute abnormality. Hepatobiliary: No focal liver abnormality is seen. No gallstones, gallbladder wall thickening, or biliary dilatation. Pancreas: Unremarkable.  No pancreatic ductal dilatation or surrounding inflammatory changes. Spleen: Normal in size without focal abnormality. Adrenals/Urinary Tract: Adrenal glands are within normal limits. Kidneys demonstrate a normal enhancement pattern bilaterally. No renal calculi or obstructive changes are seen. Bladder is well distended. Stomach/Bowel: Fecal material is noted throughout the colon consistent with a degree of constipation. The appendix is not well visualized and may have been surgically removed. Stomach is decompressed. Multiple fluid-filled loops of small bowel are identified in the mid abdomen which extend to a right inguinal hernia containing multiple loops of small bowel. Vascular/Lymphatic: Aortic atherosclerosis. No enlarged abdominal or pelvic lymph nodes. Reproductive: Status post hysterectomy. No adnexal masses. Other: Right inguinal hernia as described above. No abdominopelvic ascites. Musculoskeletal: Degenerative changes of left hip joint are seen. No acute bony abnormality is noted. IMPRESSION: Partial small bowel obstruction secondary to herniated loops of small bowel in the right inguinal hernia. Mild colonic constipation. Electronically Signed   By: MInez CatalinaM.D.   On: 10/17/2021 22:38    Anti-infectives: Anti-infectives (From admission, onward)    None       Assessment/Plan: SBO due to right inguinal hernia - REDUCED  constipation - adv diet  Can go home is she tolerated regular food today or tomorrow  Plan for her to follow up with Dr RChristiana Fuchs[facility] for hernia    LOS: 2 days  I reviewed nursing notes, hospitalist notes, last 24 h vitals and pain scores, last 48 h intake and output, last 24 h labs and trends, and last 24 h imaging results  TOTAL TIME 20 MIN  Joyice Faster Tajanay Hurley MD  10/19/2021

## 2021-10-19 NOTE — Progress Notes (Signed)
PROGRESS NOTE    Jodi Jefferson  BPZ:[id number] DOB: [birth year] DOA: 10/17/2021 PCP: [name], MD    Brief Narrative:  Jodi Jefferson is a 88 y.o. female with medical history significant for essential hypertension,  who is admitted to [facility] on 10/17/2021 with partial small bowel obstruction after presenting from home to [facility] ED complaining of abdominal pain.  Found to have pSBO.  Improving   Assessment and Plan: * SBO (small bowel obstruction) (HCC) -mild -enema never given -full liquid Ambulate -OOB -general surgery consulted  Leukocytosis -trending down   Hypokalemia -repleted Mg ok   Essential hypertension - hold home lisinopril for now.      DVT prophylaxis: SCDs Start: 10/17/21 2358    Code Status: Full Code Family Communication:  at bedside  Disposition Plan:  Level of care: Telemetry Medical Status is: Inpatient Remains inpatient appropriate because: resolution of pSBO    Consultants:  GS   Subjective: Overall says she is feeling poorly  Objective: Vitals:   10/18/21 1421 10/18/21 2029 10/18/21 2330 10/19/21 0637  BP: (!) 109/59 140/68 (!) 170/80 114/61  Pulse: (!) 58 (!) 57 (!) 57 (!) 56  Resp: '16 18 18 18  '$ Temp: 98.5 F (36.9 C) 98.6 F (37 C) 98 F (36.7 C) 98.2 F (36.8 C)  TempSrc: Oral Oral Oral Oral  SpO2: 97% 98% 95% 93%  Weight:        Intake/Output Summary (Last 24 hours) at 10/19/2021 2423 Last data filed at 10/19/2021 0200 Gross per 24 hour  Intake 720 ml  Output --  Net 720 ml   Filed Weights   10/17/21 1705  Weight: 44.9 kg    Examination:   General: Appearance:    Elderly female in no acute distress     Lungs:     respirations unlabored  Heart:    Bradycardic.   MS:   All extremities are intact.   Neurologic:   Awake, alert         Data Reviewed: I have personally reviewed following labs and imaging studies  CBC: Recent Labs  Lab 10/17/21 1714 10/18/21 0417  WBC  18.0* 12.5*  NEUTROABS  --  9.6*  HGB 13.3 11.4*  HCT 41.1 36.2  MCV 96.0 97.6  PLT 272 536   Basic Metabolic Panel: Recent Labs  Lab 10/17/21 1714 10/18/21 0417  NA 140 140  K 3.4* 4.0  CL 103 108  CO2 28 27  GLUCOSE 168* 102*  BUN 15 10  CREATININE 0.59 0.65  CALCIUM 10.0 9.4  MG 2.3 2.4   GFR: CrCl cannot be calculated (Unknown ideal weight.). Liver Function Tests: Recent Labs  Lab 10/17/21 1714 10/18/21 0417  AST 22 23  ALT 18 17  ALKPHOS 61 51  BILITOT 0.6 0.6  PROT 7.2 6.1*  ALBUMIN 4.0 3.3*   Recent Labs  Lab 10/17/21 1714  LIPASE 30   No results for input(s): "AMMONIA" in the last 168 hours. Coagulation Profile: No results for input(s): "INR", "PROTIME" in the last 168 hours. Cardiac Enzymes: No results for input(s): "CKTOTAL", "CKMB", "CKMBINDEX", "TROPONINI" in the last 168 hours. BNP (last 3 results) No results for input(s): "PROBNP" in the last 8760 hours. HbA1C: No results for input(s): "HGBA1C" in the last 72 hours. CBG: No results for input(s): "GLUCAP" in the last 168 hours. Lipid Profile: No results for input(s): "CHOL", "HDL", "LDLCALC", "TRIG", "CHOLHDL", "LDLDIRECT" in the last 72 hours. Thyroid Function Tests: No results for  input(s): "TSH", "T4TOTAL", "FREET4", "T3FREE", "THYROIDAB" in the last 72 hours. Anemia Panel: No results for input(s): "VITAMINB12", "FOLATE", "FERRITIN", "TIBC", "IRON", "RETICCTPCT" in the last 72 hours. Sepsis Labs: No results for input(s): "PROCALCITON", "LATICACIDVEN" in the last 168 hours.  No results found for this or any previous visit (from the past 240 hour(s)).       Radiology Studies: CT ABDOMEN PELVIS W CONTRAST  Result Date: 10/17/2021 CLINICAL DATA:  Left-sided abdominal pain EXAM: CT ABDOMEN AND PELVIS WITH CONTRAST TECHNIQUE: Multidetector CT imaging of the abdomen and pelvis was performed using the standard protocol following bolus administration of intravenous contrast. RADIATION DOSE  REDUCTION: This exam was performed according to the departmental dose-optimization program which includes automated exposure control, adjustment of the mA and/or kV according to patient size and/or use of iterative reconstruction technique. CONTRAST:  15m OMNIPAQUE IOHEXOL 300 MG/ML  SOLN COMPARISON:  12/13/2018 FINDINGS: Lower chest: No acute abnormality. Hepatobiliary: No focal liver abnormality is seen. No gallstones, gallbladder wall thickening, or biliary dilatation. Pancreas: Unremarkable. No pancreatic ductal dilatation or surrounding inflammatory changes. Spleen: Normal in size without focal abnormality. Adrenals/Urinary Tract: Adrenal glands are within normal limits. Kidneys demonstrate a normal enhancement pattern bilaterally. No renal calculi or obstructive changes are seen. Bladder is well distended. Stomach/Bowel: Fecal material is noted throughout the colon consistent with a degree of constipation. The appendix is not well visualized and may have been surgically removed. Stomach is decompressed. Multiple fluid-filled loops of small bowel are identified in the mid abdomen which extend to a right inguinal hernia containing multiple loops of small bowel. Vascular/Lymphatic: Aortic atherosclerosis. No enlarged abdominal or pelvic lymph nodes. Reproductive: Status post hysterectomy. No adnexal masses. Other: Right inguinal hernia as described above. No abdominopelvic ascites. Musculoskeletal: Degenerative changes of left hip joint are seen. No acute bony abnormality is noted. IMPRESSION: Partial small bowel obstruction secondary to herniated loops of small bowel in the right inguinal hernia. Mild colonic constipation. Electronically Signed   By: MInez CatalinaM.D.   On: 10/17/2021 22:38        Scheduled Meds:  sorbitol, milk of mag, mineral oil, glycerin (SMOG) enema  960 mL Rectal Once   Continuous Infusions:  lactated ringers 75 mL/hr at 10/19/21 0211     LOS: 2 days    Time spent: 45  minutes spent on chart review, discussion with nursing staff, consultants, updating family and interview/physical exam; more than 50% of that time was spent in counseling and/or coordination of care.    JGeradine Girt DO [facility] Available via Epic secure chat 7am-7pm After these hours, please refer to coverage provider listed on [email or web address] 10/19/2021, 8:22 AM

## 2021-10-20 DIAGNOSIS — K56609 Unspecified intestinal obstruction, unspecified as to partial versus complete obstruction: Secondary | ICD-10-CM | POA: Diagnosis not present

## 2021-10-20 LAB — CBC
HCT: 36.3 % (ref 36.0–46.0)
Hemoglobin: 11.7 g/dL — ABNORMAL LOW (ref 12.0–15.0)
MCH: 31.1 pg (ref 26.0–34.0)
MCHC: 32.2 g/dL (ref 30.0–36.0)
MCV: 96.5 fL (ref 80.0–100.0)
Platelets: 252 10*3/uL (ref 150–400)
RBC: 3.76 MIL/uL — ABNORMAL LOW (ref 3.87–5.11)
RDW: 12.8 % (ref 11.5–15.5)
WBC: 9.1 10*3/uL (ref 4.0–10.5)
nRBC: 0 % (ref 0.0–0.2)

## 2021-10-20 LAB — BASIC METABOLIC PANEL
Anion gap: 6 (ref 5–15)
BUN: 11 mg/dL (ref 8–23)
CO2: 23 mmol/L (ref 22–32)
Calcium: 9.1 mg/dL (ref 8.9–10.3)
Chloride: 111 mmol/L (ref 98–111)
Creatinine, Ser: 0.4 mg/dL — ABNORMAL LOW (ref 0.44–1.00)
GFR, Estimated: >60 mL/min (ref >60)
Glucose, Bld: 94 mg/dL (ref 70–99)
Potassium: 3.3 mmol/L — ABNORMAL LOW (ref 3.5–5.1)
Sodium: 140 mmol/L (ref 135–145)

## 2021-10-20 MED ORDER — SENNA 8.6 MG PO TABS
1.0000 | ORAL_TABLET | Freq: Every day | ORAL | Status: DC
  Administered 2021-10-20: 8.6 mg via ORAL
  Filled 2021-10-20: qty 1

## 2021-10-20 MED ORDER — SENNA 8.6 MG PO TABS: 2.0000 | ORAL_TABLET | Freq: Every day | ORAL | 0 refills | Status: DC

## 2021-10-20 MED ORDER — BISACODYL 5 MG PO TBEC
10.0000 mg | DELAYED_RELEASE_TABLET | Freq: Once | ORAL | Status: AC
  Administered 2021-10-20: 10 mg via ORAL
  Filled 2021-10-20: qty 2

## 2021-10-20 MED ORDER — BISACODYL 10 MG RE SUPP: 10.0000 mg | Freq: Every day | RECTAL | 0 refills | Status: AC | PRN

## 2021-10-20 MED ORDER — POTASSIUM CHLORIDE CRYS ER 10 MEQ PO TBCR
40.0000 meq | EXTENDED_RELEASE_TABLET | Freq: Once | ORAL | Status: AC
  Administered 2021-10-20: 40 meq via ORAL
  Filled 2021-10-20: qty 4

## 2021-10-20 MED ORDER — BISACODYL 5 MG PO TBEC: 10.0000 mg | DELAYED_RELEASE_TABLET | Freq: Every day | ORAL | 0 refills | Status: AC | PRN

## 2021-10-20 MED ORDER — BISACODYL 10 MG RE SUPP
10.0000 mg | Freq: Once | RECTAL | Status: AC
Start: 2021-10-20 — End: 2021-10-20
  Administered 2021-10-20: 10 mg via RECTAL
  Filled 2021-10-20: qty 1

## 2021-10-20 MED ORDER — MAGNESIUM HYDROXIDE 400 MG/5ML PO SUSP
30.0000 mL | Freq: Once | ORAL | Status: AC
Start: 2021-10-20 — End: 2021-10-20
  Administered 2021-10-20: 30 mL via ORAL
  Filled 2021-10-20: qty 30

## 2021-10-20 MED ORDER — SORBITOL 70 % SOLN
400.0000 mL | TOPICAL_OIL | Freq: Once | ORAL | Status: AC
  Administered 2021-10-20: 400 mL via RECTAL
  Filled 2021-10-20: qty 120

## 2021-10-20 MED ORDER — SENNA 8.6 MG PO TABS
2.0000 | ORAL_TABLET | Freq: Every day | ORAL | Status: DC
Start: 2021-10-20 — End: 2021-10-22
  Administered 2021-10-20 – 2021-10-21 (×2): 17.2 mg via ORAL
  Filled 2021-10-20 (×2): qty 2

## 2021-10-20 MED ORDER — SENNA 8.6 MG PO TABS
2.0000 | ORAL_TABLET | Freq: Once | ORAL | Status: AC
Start: 2021-10-20 — End: 2021-10-20
  Administered 2021-10-20: 17.2 mg via ORAL
  Filled 2021-10-20: qty 2

## 2021-10-20 NOTE — TOC Initial Note (Addendum)
Transition of Care [facility]) - Initial/Assessment Note    Patient Details  Name: Jodi Jefferson MRN: [id number] Date of Birth: 1931-06-13  [facility] CM/SW Contact:    Marilu Favre, RN Phone Number: 10/20/2021, 12:04 PM  Clinical Narrative:                  Spoke to patient and son at bedside, regarding 3 in 1 and HHPT. Patient's other son ( the son she lives with ) is on his way to hospital now. Patient and son at bedside asked if NCM could speak to him when he arrives. NCM asked nurse to notify NCM when other son arrives.   3 in1 was called for .   32 Spoke to both sons at bedside and patient. Discussed home health PT. All in agreement. No Preference. Danise Mina with [facility] accepted  Expected Discharge Plan: [place] Barriers to Discharge: No Barriers Identified   Patient Goals and CMS Choice Patient states their goals for this hospitalization and ongoing recovery are:: to return to home CMS Medicare.gov Compare Post Acute Care list provided to:: Patient Choice offered to / list presented to : Patient  Expected Discharge Plan and Services Expected Discharge Plan: [place]   Discharge Planning Services: CM Consult Post Acute Care Choice: [place] arrangements for the past 2 months: Single Family Home Expected Discharge Date: 10/20/21               DME Arranged: 3-N-1 DME Agency: [employer] Date DME Agency Contacted: 10/20/21 Time DME Agency Contacted: 1204 Representative spoke with at DME Agency: [place]: PT          Prior Living Arrangements/Services Living arrangements for the past 2 months: [place] Lives with:: Adult Children Patient language and need for interpreter reviewed:: Yes        Need for Family Participation in Patient Care: Yes (Comment) Care giver support system in place?: Yes (comment) Current home services: DME Criminal Activity/Legal Involvement  Pertinent to Current Situation/Hospitalization: No - Comment as needed  Activities of Daily Living Home Assistive Devices/Equipment: Walker (specify type) ADL Screening (condition at time of admission) Patient's cognitive ability adequate to safely complete daily activities?: Yes Is the patient deaf or have difficulty hearing?: No Does the patient have difficulty seeing, even when wearing glasses/contacts?: No Does the patient have difficulty concentrating, remembering, or making decisions?: No Patient able to express need for assistance with ADLs?: Yes Does the patient have difficulty dressing or bathing?: Yes Independently performs ADLs?: No Communication: Independent Dressing (OT): Appropriate for developmental age Grooming: Appropriate for developmental age Feeding: Appropriate for developmental age Bathing: Appropriate for developmental age 97: Needs assistance Is this a change from baseline?: Pre-admission baseline In/Out Bed: Needs assistance Is this a change from baseline?: Pre-admission baseline Walks in Home: Appropriate for developmental age Does the patient have difficulty walking or climbing stairs?: Yes Weakness of Legs: Both Weakness of Arms/Hands: None  Permission Sought/Granted   Permission granted to share information with : Yes, Verbal Permission Granted  Share Information with NAME: both sons           Emotional Assessment Appearance:: Appears stated age            Admission diagnosis:  SBO (small bowel obstruction) ([place]) [K56.609] Patient Active Problem List   Diagnosis Date Noted   Hypokalemia 10/18/2021   Leukocytosis 10/18/2021   SBO (small bowel obstruction) ([place]) 10/17/2021  Cataract    Allergy    Leg swelling 01/02/2019   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   Macular degeneration 01/26/2017   Closed pelvic fracture ([place]) 01/26/2017   Essential hypertension 01/26/2017   GERD (gastroesophageal reflux disease) 01/26/2017   Arthritis  01/26/2017   Hyperlipemia, mixed 01/26/2017   Osteoporosis 01/26/2017   Anxiety 01/26/2017   Chest pain 01/26/2017   Abnormal EKG 01/26/2017   Arthralgia of left knee 11/29/2013   Primary osteoarthritis of left knee 11/29/2013   PCP:  [name], MD Pharmacy:   [facility], [place] [address] [place] [address] Phone: [phone] Fax: [phone]     Social Determinants of Health (SDOH) Interventions    Readmission Risk Interventions     No data to display

## 2021-10-20 NOTE — Progress Notes (Signed)
  Mobility Specialist Criteria Algorithm Info.    10/20/21 1345  Mobility  Activity Ambulated with assistance in hallway  Range of Motion/Exercises Active;All extremities  Level of Assistance Contact guard assist, steadying assist  Assistive Device Front wheel walker  Distance Ambulated (ft) 40 ft  Activity Response Tolerated well   Patient received in recliner and agreeable to participate in mobility. Ambulated min G in hallway with slow steady gait. Returned to room without complaint or incident. Was left in recliner with all needs met, call bell in reach.   10/20/2021 1:45PM  [place] Dolan Xia, [place], [place]  [facility]:[phone] Office: [phone]

## 2021-10-20 NOTE — Progress Notes (Signed)
   General Surgery Follow Up Note  Subjective:    Overnight Issues:   Objective:  Vital signs for last 24 hours: Temp:  [98.1 F (36.7 C)-98.9 F (37.2 C)] 98.3 F (36.8 C) (07/03 0754) Pulse Rate:  [58-75] 61 (07/03 0754) Resp:  [16] 16 (07/03 0754) BP: (129-195)/(67-90) 164/90 (07/03 0754) SpO2:  [96 %-99 %] 98 % (07/03 0754) Weight:  [45.7 kg] 45.7 kg (07/03 0538)  Hemodynamic parameters for last 24 hours:    Intake/Output from previous day: 07/02 0701 - 07/03 0700 In: -  Out: 800 [Urine:800]  Intake/Output this shift: No intake/output data recorded.  Vent settings for last 24 hours:    Physical Exam:  Gen: comfortable, no distress Neuro: non-focal exam HEENT: PERRL Neck: supple CV: RRR Pulm: unlabored breathing Abd: soft, NT GU: clear yellow urine Extr: wwp, no edema   Results for orders placed or performed during the hospital encounter of 10/17/21 (from the past 24 hour(s))  CBC     Status: Abnormal   Collection Time: 10/20/21  4:26 AM  Result Value Ref Range   WBC 9.1 4.0 - 10.5 K/uL   RBC 3.76 (L) 3.87 - 5.11 MIL/uL   Hemoglobin 11.7 (L) 12.0 - 15.0 g/dL   HCT 36.3 36.0 - 46.0 %   MCV 96.5 80.0 - 100.0 fL   MCH 31.1 26.0 - 34.0 pg   MCHC 32.2 30.0 - 36.0 g/dL   RDW 12.8 11.5 - 15.5 %   Platelets 252 150 - 400 K/uL   nRBC 0.0 0.0 - 0.2 %  Basic metabolic panel     Status: Abnormal   Collection Time: 10/20/21  4:26 AM  Result Value Ref Range   Sodium 140 135 - 145 mmol/L   Potassium 3.3 (L) 3.5 - 5.1 mmol/L   Chloride 111 98 - 111 mmol/L   CO2 23 22 - 32 mmol/L   Glucose, Bld 94 70 - 99 mg/dL   BUN 11 8 - 23 mg/dL   Creatinine, Ser 0.40 (L) 0.44 - 1.00 mg/dL   Calcium 9.1 8.9 - 10.3 mg/dL   GFR, Estimated >60 >60 mL/min   Anion gap 6 5 - 15    Assessment & Plan: The plan of care was discussed with the bedside nurse for the day, who is in agreement with this plan and no additional concerns were raised.   Present on Admission:  SBO  (small bowel obstruction) (HCC)  Hypokalemia  Leukocytosis  Essential hypertension    LOS: 3 days   Additional comments:I reviewed the patient's new clinical lab test results.   and I reviewed the patients new imaging test results.    SBO due to right inguinal hernia - REDUCED  constipation - reg diet Can go home today after additional BM Plan for her to follow up with Dr Christiana Fuchs [facility] for hernia    Jodi Oka, MD Trauma & General Surgery Please use [email or web address] to contact on call provider  10/20/2021  *Care during the described time interval was provided by me. I have reviewed this patient's available data, including medical history, events of note, physical examination and test results as part of my evaluation.

## 2021-10-20 NOTE — Progress Notes (Signed)
Discharge held due to: Pt is asking to stay another night cause she's having a hard time with her bowel movements JV

## 2021-10-20 NOTE — Evaluation (Addendum)
Physical Therapy Evaluation Patient Details Name: Jodi Jefferson MRN: [id number] DOB: 03-06-1932 Today's Date: 10/20/2021  History of Present Illness  Pt is a 88 y.o. female with medical history significant for essential hypertension,  who is admitted to [facility] on 10/17/2021 with partial small bowel obstruction after presenting from home to [facility] ED complaining of abdominal pain. Pt has not received any surgical interventions.  Clinical Impression  Pt agreeable to physical therapy evaluation/treatment session. Pt performing transfers and gait at [facility] with use of RW. Pt's gait distance limited by left knee pain and weakness but anticipate she will progress well fairly quickly. Pt currently presents with functional limitations secondary to impairments listed in PT problem list. Pt to benefit from skilled, acute care physical therapy interventions to maximize her independence level and quality of life.       Recommendations for follow up therapy are one component of a multi-disciplinary discharge planning process, led by the attending physician.  Recommendations may be updated based on patient status, additional functional criteria and insurance authorization.  Follow Up Recommendations Home health PT      Assistance Recommended at Discharge Intermittent Supervision/Assistance  Patient can return home with the following  A little help with walking and/or transfers;A little help with bathing/dressing/bathroom;Help with stairs or ramp for entrance;Assist for transportation;Assistance with cooking/housework    Equipment Recommendations BSC/3in1  Recommendations for Other Services    OT consult   Functional Status Assessment Patient has had a recent decline in their functional status and demonstrates the ability to make significant improvements in function in a reasonable and predictable amount of time.     Precautions / Restrictions Precautions Precautions:  Fall Restrictions Weight Bearing Restrictions: No      Mobility  Bed Mobility Overal bed mobility: Needs Assistance Bed Mobility: Supine to Sit     Supine to sit: Supervision     General bed mobility comments: Increased time required during supine to sit    Transfers Overall transfer level: Needs assistance Equipment used: Rolling walker (2 wheels) Transfers: Sit to/from Stand Sit to Stand: Min guard, Mod assist           General transfer comment: Pt required CGA during stand from bed and moderate assistance with cues from toilet. Pt provided with cues for sequencing and hand placements.    Ambulation/Gait Ambulation/Gait assistance: Min guard Gait Distance (Feet): 40 Feet (10 feet and 30 feet respectively) Assistive device: Rolling walker (2 wheels) Gait Pattern/deviations: Step-to pattern, Decreased stance time - left, Decreased step length - left, Decreased step length - right, Antalgic   Gait velocity interpretation: <1.31 ft/sec, indicative of household ambulator   General Gait Details: Pt performed modified three point gait and limited 2/2 pain on L LE. Pt ambulated with short steps and slow pace.  Stairs            Wheelchair Mobility    Modified Rankin (Stroke Patients Only)       Balance Overall balance assessment: Mild deficits observed, not formally tested                                           Pertinent Vitals/Pain Pain Assessment Pain Assessment: No/denies pain    Home Living Family/patient expects to be discharged to:: Private residence Living Arrangements: Children (lives with son) Available Help at Discharge: Available 24 hours/day Type of Home[place]  Access: Ramped entrance       Home Layout: Able to live on main level with bedroom/bathroom;Multi-level (basement on ground level (does not go to)) Home Equipment: Rolling Walker (2 wheels);Wheelchair - manual;Grab bars - tub/shower;Grab bars - toilet       Prior Function Prior Level of Function : Independent/Modified Independent (does not drive or perform errands (son takes care of); no falls this year)             Mobility Comments: Mostly uses RW but sometimes WC for long distances. No physical assistance required. ADLs Comments: Sits in chair in bathroom for bathing with sink beside (performs independently) with exception of lower body. Independent with dressing and grooming. Able to cook some but requires assistance and also can wash dishes.     Hand Dominance   Dominant Hand: Left    Extremity/Trunk Assessment   Upper Extremity Assessment Upper Extremity Assessment: Overall WFL for tasks assessed    Lower Extremity Assessment Lower Extremity Assessment: LLE deficits/detail (grossly fair plus to good minus strength; painful weakness of left knee (chronic 2/2 arthritis))       Communication   Communication: HOH  Cognition Arousal/Alertness: Awake/alert Behavior During Therapy: WFL for tasks assessed/performed Overall Cognitive Status: History of cognitive impairments - at baseline                                 General Comments: Pt with slow processing but able to follow most commands appropriately. Pt not oriented to place or time.        General Comments      Exercises General Exercises - Lower Extremity Heel Slides: Both, Supine (12 reps) Hip ABduction/ADduction: Both, 10 reps, Supine (hip abd isometrics) Straight Leg Raises: Both, 10 reps, Supine   Assessment/Plan    PT Assessment Patient needs continued PT services  PT Problem List Decreased strength;Decreased activity tolerance;Decreased balance;Decreased mobility;Decreased cognition;Decreased knowledge of use of DME;Decreased safety awareness;Pain       PT Treatment Interventions DME instruction;Gait training;Functional mobility training;Therapeutic activities;Therapeutic exercise;Balance training;Neuromuscular re-education;Cognitive  remediation;Patient/family education;Wheelchair mobility training;Modalities    PT Goals (Current goals can be found in the Care Plan section)  Acute Rehab PT Goals Patient Stated Goal: Return to home and being mobile PT Goal Formulation: With patient Time For Goal Achievement: 10/27/21 Potential to Achieve Goals: Good    Frequency Min 3X/week     Co-evaluation               AM-PAC PT "6 Clicks" Mobility  Outcome Measure Help needed turning from your back to your side while in a flat bed without using bedrails?: A Little Help needed moving from lying on your back to sitting on the side of a flat bed without using bedrails?: A Little Help needed moving to and from a bed to a chair (including a wheelchair)?: A Little Help needed standing up from a chair using your arms (e.g., wheelchair or bedside chair)?: A Little Help needed to walk in hospital room?: A Little Help needed climbing 3-5 steps with a railing? : A Lot 6 Click Score: 17    End of Session Equipment Utilized During Treatment: Gait belt Activity Tolerance: Patient tolerated treatment well;Patient limited by pain;Patient limited by fatigue Patient left: in chair;with call bell/phone within reach;with chair alarm set;with nursing/sitter in room;with family/visitor present Nurse Communication: Mobility status;Precautions PT Visit Diagnosis: Muscle weakness (generalized) (M62.81);Pain;Other abnormalities of gait and mobility (R26.89)  Pain - Right/Left: Left Pain - part of body: Knee    Time: 3543-0148 PT Time Calculation (min) (ACUTE ONLY): 48 min   Charges:   PT Evaluation $PT Eval Low Complexity: 1 Low PT Treatments $Therapeutic Exercise: 8-22 mins $Therapeutic Activity: 8-22 mins        Donna Bernard, PT   [facility] 10/20/2021, 11:15 AM

## 2021-10-20 NOTE — Progress Notes (Addendum)
Mobility Specialist Progress Note    10/20/21 1630  Mobility  Activity Ambulated with assistance in hallway;Transferred to/from [facility]  Level of Assistance Contact guard assist, steadying assist  Assistive Device Four wheel walker  Distance Ambulated (ft) 128 ft  Activity Response Tolerated well  $Mobility charge 1 Mobility   Received pt setting off bed alarm attempting to sit EOB. Explained importance of requesting assistance for any transfer, pt acknowledged understanding. Pt also requesting to go for a walk but needing to use the BR before starting. Unsuccessful void. Inc time required d/t pt "warming-up" but no physical assist needed for transfer to [facility] or ambulation in hall. Returned back to the [facility] because pt had a sudden urge for a BM. Left on [facility] w/ son present and RN notified.  Holland Falling Mobility Specialist MS [facility] #:  [phone] Acute Rehab Office:  [phone]

## 2021-10-20 NOTE — Discharge Summary (Signed)
Physician Discharge Summary  Jodi Jefferson [id number] DOB: 1931-11-04 DOA: 10/17/2021  PCP: [name], MD  Admit date: 10/17/2021 Discharge date: 10/20/2021  Admitted From: home Discharge disposition: home   Recommendations for Outpatient Follow-Up:    Aggressive bowel regimen to avoid constipation Home health Follow up with [facility] for hernia BMP 1 week   Discharge Diagnosis:   Principal Problem:   SBO (small bowel obstruction) ([place]) Active Problems:   Essential hypertension   Hypokalemia   Leukocytosis    Discharge Condition: Improved.  Diet recommendation:   Regular.  Wound care: None.  Code status: Full.   History of Present Illness:   Jodi Jefferson is a 88 y.o. female with medical history significant for essential hypertension,  who is admitted to [facility] on 10/17/2021 with partial small bowel obstruction after presenting from home to [facility] ED complaining of abdominal pain.      The patient reports 1 week of progressive sharp generalized abdominal discomfort associated with new onset abdominal distention as well as intermittent nausea resulting in at least 5-6 total episodes of nonbloody nonbilious emesis over that timeframe, with most recent such episode occurring just prior to presenting to [facility] emergency department today.  She notes that she is continue to experience daily bowel movements over that timeframe, although they have become progressively smaller in nature, with most recent bowel movement noted to occur earlier on 10/17/2021.  Denies any associated diarrhea, melena, or hematochezia.  No associated any subjective fever, chills, rigors, generalized myalgias.  No recent trauma or travel.   The patient denies any known previous small bowel obstructions.  She notes that prior abdominal surgical history includes history of abdominal hysterectomy as well as appendectomy.  Not on any blood thinners.   Denies any  recent dysuria or gross hematuria.     Hospital Course by Problem:   SBO due to right inguinal hernia - REDUCED  constipation - reg diet -bowel regimen Plan for her to follow up with Dr Christiana Fuchs [facility] for hernia   Leukocytosis -trending down   Hypokalemia -replete Mg ok     Essential hypertension - resume home meds   Medical Consultants:   [facility]an   Discharge Exam:   Vitals:   10/20/21 0438 10/20/21 0754  BP: (!) 159/74 (!) 164/90  Pulse: 66 61  Resp: 16 16  Temp: 98.3 F (36.8 C) 98.3 F (36.8 C)  SpO2: 96% 98%   Vitals:   10/19/21 2326 10/20/21 0438 10/20/21 0538 10/20/21 0754  BP: 129/67 (!) 159/74  (!) 164/90  Pulse: 75 66  61  Resp:  16  16  Temp:  98.3 F (36.8 C)  98.3 F (36.8 C)  TempSrc:  Oral  Oral  SpO2:  96%  98%  Weight:   45.7 kg   Height:        General exam: Appears calm and comfortable.    The results of significant diagnostics from this hospitalization (including imaging, microbiology, ancillary and laboratory) are listed below for reference.     Procedures and Diagnostic Studies:   CT ABDOMEN PELVIS W CONTRAST  Result Date: 10/17/2021 CLINICAL DATA:  Left-sided abdominal pain EXAM: CT ABDOMEN AND PELVIS WITH CONTRAST TECHNIQUE: Multidetector CT imaging of the abdomen and pelvis was performed using the standard protocol following bolus administration of intravenous contrast. RADIATION DOSE REDUCTION: This exam was performed according to the departmental dose-optimization program which includes automated exposure control, adjustment of the mA  and/or kV according to patient size and/or use of iterative reconstruction technique. CONTRAST:  30m OMNIPAQUE IOHEXOL 300 MG/ML  SOLN COMPARISON:  12/13/2018 FINDINGS: Lower chest: No acute abnormality. Hepatobiliary: No focal liver abnormality is seen. No gallstones, gallbladder wall thickening, or biliary dilatation. Pancreas: Unremarkable. No pancreatic ductal dilatation or surrounding inflammatory  changes. Spleen: Normal in size without focal abnormality. Adrenals/Urinary Tract: Adrenal glands are within normal limits. Kidneys demonstrate a normal enhancement pattern bilaterally. No renal calculi or obstructive changes are seen. Bladder is well distended. Stomach/Bowel: Fecal material is noted throughout the colon consistent with a degree of constipation. The appendix is not well visualized and may have been surgically removed. Stomach is decompressed. Multiple fluid-filled loops of small bowel are identified in the mid abdomen which extend to a right inguinal hernia containing multiple loops of small bowel. Vascular/Lymphatic: Aortic atherosclerosis. No enlarged abdominal or pelvic lymph nodes. Reproductive: Status post hysterectomy. No adnexal masses. Other: Right inguinal hernia as described above. No abdominopelvic ascites. Musculoskeletal: Degenerative changes of left hip joint are seen. No acute bony abnormality is noted. IMPRESSION: Partial small bowel obstruction secondary to herniated loops of small bowel in the right inguinal hernia. Mild colonic constipation. Electronically Signed   By: MInez CatalinaM.D.   On: 10/17/2021 22:38     Labs:   Basic Metabolic Panel: Recent Labs  Lab 10/17/21 1714 10/18/21 0417 10/20/21 0426  NA 140 140 140  K 3.4* 4.0 3.3*  CL 103 108 111  CO2 '28 27 23  '$ GLUCOSE 168* 102* 94  BUN '15 10 11  '$ CREATININE 0.59 0.65 0.40*  CALCIUM 10.0 9.4 9.1  MG 2.3 2.4  --    GFR Estimated Creatinine Clearance: 32.9 mL/min (A) (by C-G formula based on SCr of 0.4 mg/dL (L)). Liver Function Tests: Recent Labs  Lab 10/17/21 1714 10/18/21 0417  AST 22 23  ALT 18 17  ALKPHOS 61 51  BILITOT 0.6 0.6  PROT 7.2 6.1*  ALBUMIN 4.0 3.3*   Recent Labs  Lab 10/17/21 1714  LIPASE 30   No results for input(s): "AMMONIA" in the last 168 hours. Coagulation profile No results for input(s): "INR", "PROTIME" in the last 168 hours.  CBC: Recent Labs  Lab  10/17/21 1714 10/18/21 0417 10/20/21 0426  WBC 18.0* 12.5* 9.1  NEUTROABS  --  9.6*  --   HGB 13.3 11.4* 11.7*  HCT 41.1 36.2 36.3  MCV 96.0 97.6 96.5  PLT 272 255 252   Cardiac Enzymes: No results for input(s): "CKTOTAL", "CKMB", "CKMBINDEX", "TROPONINI" in the last 168 hours. BNP: Invalid input(s): "POCBNP" CBG: No results for input(s): "GLUCAP" in the last 168 hours. D-Dimer No results for input(s): "DDIMER" in the last 72 hours. Hgb A1c No results for input(s): "HGBA1C" in the last 72 hours. Lipid Profile No results for input(s): "CHOL", "HDL", "LDLCALC", "TRIG", "CHOLHDL", "LDLDIRECT" in the last 72 hours. Thyroid function studies No results for input(s): "TSH", "T4TOTAL", "T3FREE", "THYROIDAB" in the last 72 hours.  Invalid input(s): "FREET3" Anemia work up No results for input(s): "VITAMINB12", "FOLATE", "FERRITIN", "TIBC", "IRON", "RETICCTPCT" in the last 72 hours. Microbiology Recent Results (from the past 240 hour(s))  Urine Culture     Status: None   Collection Time: 10/17/21  9:54 PM   Specimen: Urine, Clean Catch  Result Value Ref Range Status   Specimen Description URINE, CLEAN CATCH  Final   Special Requests NONE  Final   Culture   Final    NO GROWTH Performed at  [facility], [place] [address], [place], [place] [address]    Report Status 10/19/2021 FINAL  Final     Discharge Instructions:   Discharge Instructions     Diet general   Complete by: As directed    Discharge instructions   Complete by: As directed    Needs aggressive bowel regimen to avoid constipation   Increase activity slowly   Complete by: As directed       Allergies as of 10/20/2021       Reactions   Aspirin Other (See Comments)   Stomach burns   Macrobid [nitrofurantoin Macrocrystal] Other (See Comments)   Unknown reaction   Motrin [ibuprofen] Other (See Comments)   GI intolerance   Polyethylene Glycol Other (See Comments)   Facial flushing Tingling    Prednisone  Other (See Comments)   Unknown reaction   Red Yeast Rice [cholestin] Other (See Comments)   Unknown reaction   Sulfa Antibiotics Other (See Comments)   Unknown reaction   Codeine Nausea Only        Medication List     TAKE these medications    acetaminophen 650 MG CR tablet Commonly known as: TYLENOL Take 650 mg by mouth as needed.   amoxicillin 500 MG capsule Commonly known as: AMOXIL Take 2,000 mg by mouth See admin instructions. Take 4 capsules (2000 mg) by mouth one hour prior to dental appointments   bisacodyl 5 MG EC tablet Commonly known as: DULCOLAX Take 2 tablets (10 mg total) by mouth daily as needed for moderate constipation or mild constipation.   bisacodyl 10 MG suppository Commonly known as: DULCOLAX Place 1 suppository (10 mg total) rectally daily as needed for moderate constipation.   calcium carbonate 750 MG chewable tablet Commonly known as: TUMS EX Chew 750 mg by mouth daily as needed for heartburn.   carboxymethylcellulose 0.5 % Soln Commonly known as: REFRESH PLUS Place 1 drop into both eyes 4 (four) times daily as needed (dry eyes).   CENTRUM SILVER PO Take 1 tablet by mouth daily.   cyanocobalamin 1000 MCG/ML injection Commonly known as: (VITAMIN B-12) Inject 1,000 mcg into the muscle every 3 (three) months.   denosumab 60 MG/ML Sosy injection Commonly known as: PROLIA Inject 60 mg into the skin every 6 (six) months.   lisinopril 10 MG tablet Commonly known as: ZESTRIL Take 10 mg by mouth every evening.   senna 8.6 MG Tabs tablet Commonly known as: SENOKOT Take 2 tablets (17.2 mg total) by mouth daily.               Durable Medical Equipment  (From admission, onward)           Start     Ordered   10/20/21 1122  For home use only DME 3 n 1  Once        10/20/21 1121            Follow-up Information     Jacqlyn Larsen II, MD Follow up in 1 week(s).   Specialty: Family Medicine Contact information: [place] [address] [phone]         Ralene Ok, MD. Schedule an appointment as soon as possible for a visit.   Specialty: General Surgery Why: hernia Contact information: [place] [address] [phone]                  Time coordinating discharge: 35 min  Signed:  Geradine Girt DO  [facility] 10/20/2021, 11:25 AM

## 2021-10-20 NOTE — Plan of Care (Signed)
  Problem: Pain Managment: Goal: General experience of comfort will improve Outcome: Progressing   Problem: Safety: Goal: Ability to remain free from injury will improve Outcome: Progressing   Problem: Clinical Measurements: Goal: Ability to maintain clinical measurements within normal limits will improve Outcome: Progressing Goal: Will remain free from infection Outcome: Progressing Goal: Diagnostic test results will improve Outcome: Progressing Goal: Respiratory complications will improve Outcome: Progressing Goal: Cardiovascular complication will be avoided Outcome: Progressing

## 2021-10-21 ENCOUNTER — Inpatient Hospital Stay (HOSPITAL_COMMUNITY): Payer: Medicare Other

## 2021-10-21 DIAGNOSIS — K920 Hematemesis: Secondary | ICD-10-CM

## 2021-10-21 DIAGNOSIS — K56609 Unspecified intestinal obstruction, unspecified as to partial versus complete obstruction: Secondary | ICD-10-CM | POA: Diagnosis not present

## 2021-10-21 LAB — CBC
HCT: 39.1 % (ref 36.0–46.0)
HCT: 34.8 % — ABNORMAL LOW (ref 36.0–46.0)
Hemoglobin: 12.9 g/dL (ref 12.0–15.0)
Hemoglobin: 11.8 g/dL — ABNORMAL LOW (ref 12.0–15.0)
MCH: 31.2 pg (ref 26.0–34.0)
MCH: 32.3 pg (ref 26.0–34.0)
MCHC: 33 g/dL (ref 30.0–36.0)
MCHC: 33.9 g/dL (ref 30.0–36.0)
MCV: 94.4 fL (ref 80.0–100.0)
MCV: 95.3 fL (ref 80.0–100.0)
Platelets: 306 10*3/uL (ref 150–400)
Platelets: 258 10*3/uL (ref 150–400)
RBC: 4.14 MIL/uL (ref 3.87–5.11)
RBC: 3.65 MIL/uL — ABNORMAL LOW (ref 3.87–5.11)
RDW: 13.1 % (ref 11.5–15.5)
RDW: 13.2 % (ref 11.5–15.5)
WBC: 12.8 10*3/uL — ABNORMAL HIGH (ref 4.0–10.5)
WBC: 7.3 10*3/uL (ref 4.0–10.5)
nRBC: 0 % (ref 0.0–0.2)
nRBC: 0 % (ref 0.0–0.2)

## 2021-10-21 LAB — COMPREHENSIVE METABOLIC PANEL
ALT: 18 U/L (ref 0–44)
AST: 18 U/L (ref 15–41)
Albumin: 3 g/dL — ABNORMAL LOW (ref 3.5–5.0)
Alkaline Phosphatase: 47 U/L (ref 38–126)
Anion gap: 6 (ref 5–15)
BUN: 9 mg/dL (ref 8–23)
CO2: 26 mmol/L (ref 22–32)
Calcium: 10.3 mg/dL (ref 8.9–10.3)
Chloride: 106 mmol/L (ref 98–111)
Creatinine, Ser: 0.4 mg/dL — ABNORMAL LOW (ref 0.44–1.00)
GFR, Estimated: >60 mL/min (ref >60)
Glucose, Bld: 100 mg/dL — ABNORMAL HIGH (ref 70–99)
Potassium: 4.1 mmol/L (ref 3.5–5.1)
Sodium: 138 mmol/L (ref 135–145)
Total Bilirubin: 0.5 mg/dL (ref 0.3–1.2)
Total Protein: 5.6 g/dL — ABNORMAL LOW (ref 6.5–8.1)

## 2021-10-21 MED ORDER — PANTOPRAZOLE SODIUM 40 MG IV SOLR
40.0000 mg | Freq: Two times a day (BID) | INTRAVENOUS | Status: DC
Start: 2021-10-21 — End: 2021-10-22
  Administered 2021-10-21 (×2): 40 mg via INTRAVENOUS
  Filled 2021-10-21 (×2): qty 10

## 2021-10-21 MED ORDER — SODIUM CHLORIDE 0.9 % IV SOLN
INTRAVENOUS | Status: DC
Start: 2021-10-21 — End: 2021-10-22

## 2021-10-21 MED ORDER — MENTHOL 3 MG MT LOZG
1.0000 | LOZENGE | OROMUCOSAL | Status: DC | PRN
  Administered 2021-10-21: 3 mg via ORAL
  Filled 2021-10-21: qty 9

## 2021-10-21 NOTE — Progress Notes (Signed)
Physical Therapy Treatment Patient Details Name: Jodi Jefferson MRN: [id number] DOB: 06-11-1931 Today's Date: 10/21/2021   History of Present Illness Pt is a 88 y.o. female with medical history significant for essential hypertension,  who is admitted to [facility] on 10/17/2021 with partial small bowel obstruction after presenting from home to [facility] ED complaining of abdominal pain. Pt has not received any surgical interventions.    PT Comments    Pt able to increase gait distance today with RW. Pt required increased cues for safety awareness during transfers. Pt's son present for education and practice of stand pivot transfer.   Recommendations for follow up therapy are one component of a multi-disciplinary discharge planning process, led by the attending physician.  Recommendations may be updated based on patient status, additional functional criteria and insurance authorization.  Follow Up Recommendations  Home health PT     Assistance Recommended at Discharge Intermittent Supervision/Assistance  Patient can return home with the following A little help with walking and/or transfers;A little help with bathing/dressing/bathroom;Help with stairs or ramp for entrance;Assist for transportation;Assistance with cooking/housework   Equipment Recommendations  BSC/3in1    Recommendations for Other Services OT consult     Precautions / Restrictions Precautions Precautions: Fall Restrictions Weight Bearing Restrictions: No     Mobility  Bed Mobility Overal bed mobility: Needs Assistance Bed Mobility: Supine to Sit, Sit to Supine     Supine to sit: Supervision Sit to supine: Supervision   General bed mobility comments: Increased time required due to weakness.    Transfers Overall transfer level: Needs assistance Equipment used: Rolling walker (2 wheels) Transfers: Sit to/from Stand Sit to Stand: Min guard, Min assist           General transfer comment: Pt  required increased cues for sequencing and hand placements (decreased carryover). Pt also performed several stand pivot transfers from [facility] <> bed including once with pt's son. Pt required min assist from therapist x 1 when standing from [facility] due to slight retropulsion.    Ambulation/Gait Ambulation/Gait assistance: Min guard Gait Distance (Feet): 64 Feet Assistive device: Rolling walker (2 wheels) Gait Pattern/deviations: Step-to pattern, Decreased stance time - left, Decreased step length - left, Decreased step length - right, Antalgic, Trunk flexed   Gait velocity interpretation: <1.31 ft/sec, indicative of household ambulator   General Gait Details: Pt performed modified three point gait and limited 2/2 pain in L knee as well as decreased endurance. Pt required cues for pacing.   Stairs             Wheelchair Mobility    Modified Rankin (Stroke Patients Only)       Balance Overall balance assessment: Mild deficits observed, not formally tested                                          Cognition Arousal/Alertness: Awake/alert Behavior During Therapy: WFL for tasks assessed/performed Overall Cognitive Status: History of cognitive impairments - at baseline                                 General Comments: Pt with slow processing (complicated by pt's HOH) but pt able to follow most commands appropriately. Pt A and O to person, time, and place but not situation (reason why she is in hospital; thought she had a  fall). Memory deficits present.        Exercises General Exercises - Lower Extremity Heel Slides: Both, Supine, 10 reps Straight Leg Raises: Both, 10 reps, Supine, Other (comment) (also performed bridges x 10 in supine)    General Comments General comments (skin integrity, edema, etc.): HR at 73 and SpO2 at 97% on RA      Pertinent Vitals/Pain Pain Assessment Pain Assessment: 0-10 Pain Score: 5  Pain Location: left knee Pain  Intervention(s): Limited activity within patient's tolerance, Monitored during session    Home Living                          Prior Function            PT Goals (current goals can now be found in the care plan section) Acute Rehab PT Goals Patient Stated Goal: Return to home and being mobile PT Goal Formulation: With patient Time For Goal Achievement: 10/27/21 Potential to Achieve Goals: Good Progress towards PT goals: Progressing toward goals    Frequency    Min 3X/week      PT Plan Current plan remains appropriate    Co-evaluation              AM-PAC PT "6 Clicks" Mobility   Outcome Measure  Help needed turning from your back to your side while in a flat bed without using bedrails?: A Little Help needed moving from lying on your back to sitting on the side of a flat bed without using bedrails?: A Little Help needed moving to and from a bed to a chair (including a wheelchair)?: A Little Help needed standing up from a chair using your arms (e.g., wheelchair or bedside chair)?: A Little Help needed to walk in hospital room?: A Little Help needed climbing 3-5 steps with a railing? : A Lot 6 Click Score: 17    End of Session Equipment Utilized During Treatment: Gait belt Activity Tolerance: Patient tolerated treatment well;Patient limited by pain;Patient limited by fatigue Patient left: in bed;with bed alarm set;with family/visitor present;with nursing/sitter in room Nurse Communication: Mobility status PT Visit Diagnosis: Muscle weakness (generalized) (M62.81);Pain;Other abnormalities of gait and mobility (R26.89) Pain - Right/Left: Left Pain - part of body: Knee     Time: 8115-7262 PT Time Calculation (min) (ACUTE ONLY): 33 min  Charges:  $Gait Training: 8-22 mins $Therapeutic Exercise: 8-22 mins                     Donna Bernard, PT    [facility] 10/21/2021, 8:27 AM

## 2021-10-21 NOTE — Consult Note (Addendum)
[facility] Consult: 9:46 AM 10/21/2021  LOS: 4 days    Referring Provider: Dr Eliseo Squires  Primary Care Physician:  Jodi Sheriff, MD Primary Gastroenterologist:  Dr. Lyndel Jefferson     Reason for Consultation: Dark emesis   HPI: Jodi Jefferson is a 88 y.o. female.  PMH GERD.  Gastric ulcers.  Osteoporosis.  Anxiety.  B12 deficiency.  Surgeries include appendectomy, abdominal hysterectomy.  Remote colonoscopy with Dr. Melina Jefferson, findings unknown.  Pt expressed desire to never have repeat colonoscopy. 11/2018 EGD.  For epigastric pain. Small hiatal hernia.  10 mm, cratered, nonbleeding gastric ulcer.  Examined duodenum normal.  Pathology confirmed Brunner's gland hyperplasia, gastric hyperemia, focal erosion with differential including medication related gastropathy.  Stains negative for H. Pylori. 06/2018 abdominal ultrasound: Ill-defined, indeterminant, 2.7 cm focus at left lobe of liver with internal calcification.  Gallbladder wall adenomyomatosis. 11/2018 CTAP:  no left hepatic lobe mass, clustered calcifications likely related to prior granulomatous disease.  Significant stenosis at proximal SMA.  Likely lipoma and small bowel.  Suggestion of rectal prolapse.    Patient presented to ED via ambulance on 6/30 for evaluation abdominal distention, pain, nonbloody emesis.  Pain began about 3 days previously. Son actually says she has been complaining with epigastric distress for about 3 weeks.  He tried using OTC Pepcid and Mylanta but symptoms persisted.  Prior to that she was not taking any acid suppressing medication.  Also not taking aspirin or NSAIDs.  For a long time she has had a poor appetite.  No melanotic stools.  CTAP w contrast confirmed partial SBO with herniated loops of small bowel within the right inguinal  hernia and mild constipation. General surgery, Dr. Windle Guard evaluated the patient.  The hernia was easily reduced, pain improved/resolved and she continued having bowel movements.  Tolerating regular food.  Plan was for discharge and follow-up with [facility] regarding hernia repair.  Over late yesterday she developed recurrent nausea and vomiting and single episode of dark emesis.  BMs remained brown, last was yesterday.  No severe abdominal pain.  Has been receiving Lovenox SQ.  IV Protonix bid initiated at 1030 this morning.  Prior to that was not on any gastric acid controlling medication  Hgb yesterday 11.7, 12.9 this morning.  BUN normal. Potassium 3.3.  Lives in [place].  Attentive son.  No alcohol.    Past Medical History:  Diagnosis Date   Abnormal EKG 01/26/2017   Allergy    Anxiety 01/26/2017   Arthritis 01/26/2017   Benign essential hypertension 01/26/2017   Cataract    Chest pain 01/26/2017   Closed pelvic fracture ([place]) 01/26/2017   GERD (gastroesophageal reflux disease) 01/26/2017   Hyperlipemia, mixed 01/26/2017   Macular degeneration 01/26/2017   Osteoporosis 01/26/2017    Past Surgical History:  Procedure Laterality Date   ABDOMINAL HYSTERECTOMY     APPENDECTOMY     COLONOSCOPY     many years ago    ESOPHAGOGASTRODUODENOSCOPY  2010   [place]      Prior to  Admission medications   Medication Sig Start Date End Date Taking? Authorizing Provider  acetaminophen (TYLENOL) 650 MG CR tablet Take 650 mg by mouth as needed.    Yes [provider]  amoxicillin (AMOXIL) 500 MG capsule Take 2,000 mg by mouth See admin instructions. Take 4 capsules (2000 mg) by mouth one hour prior to dental appointments 08/04/21  Yes [provider]  calcium carbonate (TUMS EX) 750 MG chewable tablet Chew 750 mg by mouth daily as needed for heartburn.   Yes [provider]  carboxymethylcellulose (REFRESH PLUS) 0.5 % SOLN Place 1 drop into both eyes 4  (four) times daily as needed (dry eyes).   Yes [provider]  cyanocobalamin (,VITAMIN B-12,) 1000 MCG/ML injection Inject 1,000 mcg into the muscle every 3 (three) months.   Yes [provider]  denosumab (PROLIA) 60 MG/ML SOSY injection Inject 60 mg into the skin every 6 (six) months.   Yes [provider]  lisinopril (ZESTRIL) 10 MG tablet Take 10 mg by mouth every evening.   Yes [provider]  Multiple Vitamins-Minerals (CENTRUM SILVER PO) Take 1 tablet by mouth daily.   Yes [provider]  bisacodyl (DULCOLAX) 10 MG suppository Place 1 suppository (10 mg total) rectally daily as needed for moderate constipation. 10/20/21   Jodi Girt, DO  bisacodyl (DULCOLAX) 5 MG EC tablet Take 2 tablets (10 mg total) by mouth daily as needed for moderate constipation or mild constipation. 10/20/21   Jodi Girt, DO  senna (SENOKOT) 8.6 MG TABS tablet Take 2 tablets (17.2 mg total) by mouth daily. 10/20/21   Jodi Girt, DO    Scheduled Meds:  diclofenac Sodium  4 g Topical QID   lisinopril  10 mg Oral QPM   pantoprazole (PROTONIX) IV  40 mg Intravenous Q12H   senna  2 tablet Oral Daily   Infusions:  PRN Meds: acetaminophen **OR** acetaminophen, bisacodyl, hydrALAZINE, naLOXone (NARCAN)  injection, ondansetron (ZOFRAN) IV   Allergies as of 10/17/2021 - Review Complete 10/17/2021  Allergen Reaction Noted   Aspirin  01/26/2017   Macrobid [nitrofurantoin macrocrystal]  01/26/2017   Motrin [ibuprofen] Other (See Comments) 05/23/2020   Polyethylene glycol  01/26/2017   Prednisone  01/26/2017   Red yeast rice [cholestin]  01/26/2017   Sulfa antibiotics  01/26/2017   Codeine Nausea Only 11/10/2016    Family History  Problem Relation Age of Onset   Heart attack Father    Prostate cancer Paternal Grandfather    Colon cancer Neg Hx    Esophageal cancer Neg Hx    Rectal cancer Neg Hx    Stomach cancer Neg Hx     Social History    Socioeconomic History   Marital status: Married    Spouse name: Not on file   Number of children: Not on file   Years of education: Not on file   Highest education level: Not on file  Occupational History   Not on file  Tobacco Use   Smoking status: Never   Smokeless tobacco: Never  Vaping Use   Vaping Use: Never used  Substance and Sexual Activity   Alcohol use: No   Drug use: No   Sexual activity: Not on file  Other Topics Concern   Not on file  Social History Narrative   Not on file   Social Determinants of Health   Financial Resource Strain: Not on file  Food Insecurity: Not on file  Transportation Needs: Not on file  Physical Activity: Not on file  Stress: Not on file  Social Connections: Not on file  Intimate Partner Violence: Not on file    REVIEW OF SYSTEMS: Constitutional: Some weakness, not profound. ENT:  No nose bleeds.  Hearing loss.  Dry eyes Pulm: Denies shortness of breath and cough. CV:  No palpitations, no LE edema.  GU:  No hematuria, no frequency GI: No dysphagia.  See HPI. Heme: Denies unusual bleeding or bruising. Transfusions: None per record Neuro:  No headaches, no peripheral tingling or numbness Derm:  No itching, no rash or sores.  Endocrine:  No sweats or chills.  No polyuria or dysuria Immunization: Not queried.  No records of vaccinations in the chart. Travel: Not queried.   PHYSICAL EXAM: Vital signs in last 24 hours: Vitals:   10/21/21 0344 10/21/21 0817  BP: 137/76 137/79  Pulse: 76 75  Resp: 14 17  Temp: 98.2 F (36.8 C) 99.3 F (37.4 C)  SpO2: 93% 94%   Wt Readings from Last 3 Encounters:  10/21/21 48.6 kg  06/10/20 43.1 kg  05/16/20 46.3 kg    General: Somewhat frail, pleasant, hard of hearing Head: No facial asymmetry or swelling.  No signs of head trauma. Eyes: Conjunctiva slightly pale.  No scleral icterus.  EOMI. Ears: Hard of hearing but if you speak loud, slowly, clearly she can understand fine. Nose:  No congestion or discharge. Mouth: Good dentition.  Tongue midline.  Mucosa moist, pink, clears. Neck: No JVD, no masses, no thyromegaly Lungs: Clear bilaterally.  Excellent breath sounds.  No labored breathing.  No cough Heart: RRR.  No MRG.  S1, S2 present. Abdomen: Not tender, not distended.  No HSM, masses, bruits, hernias.  Containing coffee-ground material visualized, saved from episode yesterday. Rectal: Deferred. Musc/Skeltl: Osteopenia. Extremities: No CCE.  Feet are warm. Neurologic: Alert.  Hard of hearing.  Appropriate.  Not confused.  No tremors.  Moves all 4 limbs, strength not tested. Skin: No significant bruising Nodes: No cervical adenopathy Psych: Calm, pleasant, cooperative.  Intake/Output from previous day: 07/03 0701 - 07/04 0700 In: -  Out: 500 [Urine:500] Intake/Output this shift: No intake/output data recorded.  LAB RESULTS: Recent Labs    10/20/21 0426 10/21/21 0048  WBC 9.1 12.8*  HGB 11.7* 12.9  HCT 36.3 39.1  PLT 252 306   BMET Lab Results  Component Value Date   NA 140 10/20/2021   NA 140 10/18/2021   NA 140 10/17/2021   K 3.3 (L) 10/20/2021   K 4.0 10/18/2021   K 3.4 (L) 10/17/2021   CL 111 10/20/2021   CL 108 10/18/2021   CL 103 10/17/2021   CO2 23 10/20/2021   CO2 27 10/18/2021   CO2 28 10/17/2021   GLUCOSE 94 10/20/2021   GLUCOSE 102 (H) 10/18/2021   GLUCOSE 168 (H) 10/17/2021   BUN 11 10/20/2021   BUN 10 10/18/2021   BUN 15 10/17/2021   CREATININE 0.40 (L) 10/20/2021   CREATININE 0.65 10/18/2021   CREATININE 0.59 10/17/2021   CALCIUM 9.1 10/20/2021   CALCIUM 9.4 10/18/2021   CALCIUM 10.0 10/17/2021   LFT No results for input(s): "PROT", "ALBUMIN", "AST", "ALT", "ALKPHOS", "BILITOT", "BILIDIR", "IBILI" in the last 72 hours. PT/INR No results found for: "INR", "PROTIME" Hepatitis Panel No results for input(s): "HEPBSAG", "HCVAB", "HEPAIGM", "HEPBIGM" in the last 72 hours. C-Diff No components found for: "CDIFF" Lipase      Component Value Date/Time   LIPASE 30 10/17/2021 1714    Drugs of  Abuse  No results found for: "LABOPIA", "COCAINSCRNUR", "LABBENZ", "AMPHETMU", "THCU", "LABBARB"   RADIOLOGY STUDIES: No results found.   IMPRESSION:     PSBO resolved after reduction.  CGE, single episode following multiple prior episodes of non-CGE emesis.  History nonbleeding gastric ulcer in 2020.  Patient no longer on acid suppressing medications at home.    PLAN:        EGD, timing TBD.  Keep her n.p.o. for now in case we can do this EGD this afternoon.    KUB completed, not read yet.    Ordered normal saline at 50 cc/hour for 8 hours.  Azucena Freed  10/21/2021, 9:46 AM Phone [phone]

## 2021-10-21 NOTE — Progress Notes (Signed)
PROGRESS NOTE    Jodi Jefferson  FGH:[id number] DOB: 1932/01/20 DOA: 10/17/2021 PCP: Jodi Sheriff, MD    Brief Narrative:  Jodi Jefferson is a 88 y.o. female with medical history significant for essential hypertension,  who is admitted to [facility] on 10/17/2021 with partial small bowel obstruction after presenting from home to [facility] ED complaining of abdominal pain.  Found to have pSBO- this is resolved and prior to d/c yesterday she had "coffee- ground emesis" per nursing. Not sent for occult testing   Assessment and Plan: * SBO (small bowel obstruction) (HCC) -mild -enema never given despite being documented as given -advanced diet Ambulate -OOB -general surgery consult appreciated  Emesis-- ? Blood -GI consult -h/h stable -? EGD this Pm -h/o ulcer in 2020  Leukocytosis -stable   Hypokalemia -repleted Mg ok   Essential hypertension - hold home lisinopril for now.      DVT prophylaxis: SCDs Start: 10/17/21 2358    Code Status: Full Code Family Communication:  at bedside  Disposition Plan:  Level of care: Med-Surg Status is: Inpatient Remains inpatient appropriate because: ? EGD    Consultants:  GS   Subjective: No further emesis since yesterday  Objective: Vitals:   10/20/21 2249 10/21/21 0344 10/21/21 0500 10/21/21 0817  BP: 118/89 137/76  137/79  Pulse:  76  75  Resp:  14  17  Temp:  98.2 F (36.8 C)  99.3 F (37.4 C)  TempSrc:  Oral  Oral  SpO2:  93%  94%  Weight:   48.6 kg   Height:        Intake/Output Summary (Last 24 hours) at 10/21/2021 1348 Last data filed at 10/20/2021 2034 Gross per 24 hour  Intake --  Output 500 ml  Net -500 ml   Filed Weights   10/17/21 1705 10/20/21 0538 10/21/21 0500  Weight: 44.9 kg 45.7 kg 48.6 kg    Examination:   General: Appearance:    elderly female in no acute distress     Lungs:     Clear to auscultation bilaterally, respirations unlabored  Heart:    Normal heart  rate.   MS:   All extremities are intact.   Neurologic:   Awake, alert, oriented x 3. No apparent focal neurological           defect.            Data Reviewed: I have personally reviewed following labs and imaging studies  CBC: Recent Labs  Lab 10/17/21 1714 10/18/21 0417 10/20/21 0426 10/21/21 0048 10/21/21 0937  WBC 18.0* 12.5* 9.1 12.8* 7.3  NEUTROABS  --  9.6*  --   --   --   HGB 13.3 11.4* 11.7* 12.9 11.8*  HCT 41.1 36.2 36.3 39.1 34.8*  MCV 96.0 97.6 96.5 94.4 95.3  PLT 272 255 252 306 678   Basic Metabolic Panel: Recent Labs  Lab 10/17/21 1714 10/18/21 0417 10/20/21 0426 10/21/21 0937  NA 140 140 140 138  K 3.4* 4.0 3.3* 4.1  CL 103 108 111 106  CO2 '28 27 23 26  '$ GLUCOSE 168* 102* 94 100*  BUN '15 10 11 9  '$ CREATININE 0.59 0.65 0.40* 0.40*  CALCIUM 10.0 9.4 9.1 10.3  MG 2.3 2.4  --   --    GFR: Estimated Creatinine Clearance: 32.9 mL/min (A) (by C-G formula based on SCr of 0.4 mg/dL (L)). Liver Function Tests: Recent Labs  Lab 10/17/21 1714 10/18/21 0417 10/21/21 9381  AST '22 23 18  '$ ALT '18 17 18  '$ ALKPHOS 61 51 47  BILITOT 0.6 0.6 0.5  PROT 7.2 6.1* 5.6*  ALBUMIN 4.0 3.3* 3.0*   Recent Labs  Lab 10/17/21 1714  LIPASE 30   No results for input(s): "AMMONIA" in the last 168 hours. Coagulation Profile: No results for input(s): "INR", "PROTIME" in the last 168 hours. Cardiac Enzymes: No results for input(s): "CKTOTAL", "CKMB", "CKMBINDEX", "TROPONINI" in the last 168 hours. BNP (last 3 results) No results for input(s): "PROBNP" in the last 8760 hours. HbA1C: No results for input(s): "HGBA1C" in the last 72 hours. CBG: No results for input(s): "GLUCAP" in the last 168 hours. Lipid Profile: No results for input(s): "CHOL", "HDL", "LDLCALC", "TRIG", "CHOLHDL", "LDLDIRECT" in the last 72 hours. Thyroid Function Tests: No results for input(s): "TSH", "T4TOTAL", "FREET4", "T3FREE", "THYROIDAB" in the last 72 hours. Anemia Panel: No results for  input(s): "VITAMINB12", "FOLATE", "FERRITIN", "TIBC", "IRON", "RETICCTPCT" in the last 72 hours. Sepsis Labs: No results for input(s): "PROCALCITON", "LATICACIDVEN" in the last 168 hours.  Recent Results (from the past 240 hour(s))  Urine Culture     Status: None   Collection Time: 10/17/21  9:54 PM   Specimen: Urine, Clean Catch  Result Value Ref Range Status   Specimen Description URINE, CLEAN CATCH  Final   Special Requests NONE  Final   Culture   Final    NO GROWTH Performed at [facility], [address] [place],  [address]    Report Status 10/19/2021 FINAL  Final         Radiology Studies: DG Abd 1 View  Result Date: 10/21/2021 CLINICAL DATA:  Abdominal pain. EXAM: ABDOMEN - 1 VIEW COMPARISON:  CT scan 10/17/2021 FINDINGS: No gaseous small bowel dilatation evident to suggest small bowel obstruction. Prominent stool volume noted in the colon. Thoracolumbar scoliosis evident with diffuse bony demineralization. Degenerative changes are noted in the hips. IMPRESSION: 1. Compared to the scout image for CT scan 10/17/2021, overall generalized decrease in small bowel gas with prominent residual stool volume. Electronically Signed   By: Misty Stanley M.D.   On: 10/21/2021 12:00        Scheduled Meds:  diclofenac Sodium  4 g Topical QID   lisinopril  10 mg Oral QPM   pantoprazole (PROTONIX) IV  40 mg Intravenous Q12H   senna  2 tablet Oral Daily   Continuous Infusions:  sodium chloride 50 mL/hr at 10/21/21 1136     LOS: 4 days    Time spent: 45 minutes spent on chart review, discussion with nursing staff, consultants, updating family and interview/physical exam; more than 50% of that time was spent in counseling and/or coordination of care.    Geradine Girt, DO [facility] Available via Epic secure chat 7am-7pm After these hours, please refer to coverage provider listed on [email or web address] 10/21/2021, 1:48 PM

## 2021-10-21 NOTE — Progress Notes (Signed)
   Subjective/Chief Complaint: Patient did not go home yesterday due to nausea and vomiting.  Probably had some bloody emesis.  Family at bedside.  She had a small response to her enema.  Does have some cramping.  She is not vomiting this morning.  Denies any severe abdominal pain.   Objective: Vital signs in last 24 hours: Temp:  [98.2 F (36.8 C)-99.3 F (37.4 C)] 99.3 F (37.4 C) (07/04 0817) Pulse Rate:  [75-85] 75 (07/04 0817) Resp:  [14-17] 17 (07/04 0817) BP: (118-173)/(76-92) 137/79 (07/04 0817) SpO2:  [93 %-95 %] 94 % (07/04 0817) Weight:  [48.6 kg] 48.6 kg (07/04 0500) Last BM Date : 10/20/21  Intake/Output from previous day: 07/03 0701 - 07/04 0700 In: -  Out: 500 [Urine:500] Intake/Output this shift: No intake/output data recorded.   Gen: comfortable, no distress Neuro: non-focal exam HEENT: PERRL Neck: supple CV: RRR Pulm: unlabored breathing Abd: soft, NT nondistended.  Right inguinal hernia remains reduced and soft.  No evidence of left inguinal hernia.  No evidence of ventral hernia.  Extr: wwp, no edema Lab Results:  Recent Labs    10/20/21 0426 10/21/21 0048  WBC 9.1 12.8*  HGB 11.7* 12.9  HCT 36.3 39.1  PLT 252 306   BMET Recent Labs    10/20/21 0426  NA 140  K 3.3*  CL 111  CO2 23  GLUCOSE 94  BUN 11  CREATININE 0.40*  CALCIUM 9.1   PT/INR No results for input(s): "LABPROT", "INR" in the last 72 hours. ABG No results for input(s): "PHART", "HCO3" in the last 72 hours.  Invalid input(s): "PCO2", "PO2"  Studies/Results: No results found.  Anti-infectives: Anti-infectives (From admission, onward)    None       Assessment/Plan:  SBO (small bowel obstruction) (HCC)  Hypokalemia  Leukocytosis  Essential hypertension  SBO due to right inguinal hernia - REDUCED    constipation chronic   -N.p.o. for now.  Check KUB this morning.  Replace potassium.  Okay to give medications though.  We will try prune juice for her  since she does warm water at home to keep her bowels moving.  Has some unknown allergy to MiraLAX so that this can be used once that is sorted out, that may be helpful.  Hernia is reduced so I do not think that is the cause of this at this point.  White count is up slightly as well.  Monitor for now.  No acute signs of an acute abdomen.  I suspect this is from chronic constipation.  Total time 20 minutes     LOS: 4 days    Marcello Moores A Kennidy Lamke 10/21/2021

## 2021-10-21 NOTE — H&P (View-Only) (Signed)
[facility] Consult: 9:46 AM 10/21/2021  LOS: 4 days    Referring Provider: Dr Eliseo Squires  Primary Care Physician:  [name], MD Primary Gastroenterologist:  Dr. Lyndel Safe     Reason for Consultation: Dark emesis   HPI: Jodi Jefferson is a 88 y.o. female.  PMH GERD.  Gastric ulcers.  Osteoporosis.  Anxiety.  B12 deficiency.  Surgeries include appendectomy, abdominal hysterectomy.  Remote colonoscopy with Dr. Melina Copa, findings unknown.  Pt expressed desire to never have repeat colonoscopy. 11/2018 EGD.  For epigastric pain. Small hiatal hernia.  10 mm, cratered, nonbleeding gastric ulcer.  Examined duodenum normal.  Pathology confirmed Brunner's gland hyperplasia, gastric hyperemia, focal erosion with differential including medication related gastropathy.  Stains negative for H. Pylori. 06/2018 abdominal ultrasound: Ill-defined, indeterminant, 2.7 cm focus at left lobe of liver with internal calcification.  Gallbladder wall adenomyomatosis. 11/2018 CTAP:  no left hepatic lobe mass, clustered calcifications likely related to prior granulomatous disease.  Significant stenosis at proximal SMA.  Likely lipoma and small bowel.  Suggestion of rectal prolapse.    Patient presented to ED via ambulance on 6/30 for evaluation abdominal distention, pain, nonbloody emesis.  Pain began about 3 days previously. Son actually says she has been complaining with epigastric distress for about 3 weeks.  He tried using OTC Pepcid and Mylanta but symptoms persisted.  Prior to that she was not taking any acid suppressing medication.  Also not taking aspirin or NSAIDs.  For a long time she has had a poor appetite.  No melanotic stools.  CTAP w contrast confirmed partial SBO with herniated loops of small bowel within the right inguinal  hernia and mild constipation. General surgery, Dr. Windle Guard evaluated the patient.  The hernia was easily reduced, pain improved/resolved and she continued having bowel movements.  Tolerating regular food.  Plan was for discharge and follow-up with [facility] regarding hernia repair.  Over late yesterday she developed recurrent nausea and vomiting and single episode of dark emesis.  BMs remained brown, last was yesterday.  No severe abdominal pain.  Has been receiving Lovenox SQ.  IV Protonix bid initiated at 1030 this morning.  Prior to that was not on any gastric acid controlling medication  Hgb yesterday 11.7, 12.9 this morning.  BUN normal. Potassium 3.3.  Lives in [place].  Attentive son.  No alcohol.    Past Medical History:  Diagnosis Date   Abnormal EKG 01/26/2017   Allergy    Anxiety 01/26/2017   Arthritis 01/26/2017   Benign essential hypertension 01/26/2017   Cataract    Chest pain 01/26/2017   Closed pelvic fracture ([place]) 01/26/2017   GERD (gastroesophageal reflux disease) 01/26/2017   Hyperlipemia, mixed 01/26/2017   Macular degeneration 01/26/2017   Osteoporosis 01/26/2017    Past Surgical History:  Procedure Laterality Date   ABDOMINAL HYSTERECTOMY     APPENDECTOMY     COLONOSCOPY     many years ago    ESOPHAGOGASTRODUODENOSCOPY  2010   [place]      Prior to  Admission medications   Medication Sig Start Date End Date Taking? Authorizing Provider  acetaminophen (TYLENOL) 650 MG CR tablet Take 650 mg by mouth as needed.    Yes [provider]  amoxicillin (AMOXIL) 500 MG capsule Take 2,000 mg by mouth See admin instructions. Take 4 capsules (2000 mg) by mouth one hour prior to dental appointments 08/04/21  Yes [provider]  calcium carbonate (TUMS EX) 750 MG chewable tablet Chew 750 mg by mouth daily as needed for heartburn.   Yes [provider]  carboxymethylcellulose (REFRESH PLUS) 0.5 % SOLN Place 1 drop into both eyes 4  (four) times daily as needed (dry eyes).   Yes [provider]  cyanocobalamin (,VITAMIN B-12,) 1000 MCG/ML injection Inject 1,000 mcg into the muscle every 3 (three) months.   Yes [provider]  denosumab (PROLIA) 60 MG/ML SOSY injection Inject 60 mg into the skin every 6 (six) months.   Yes [provider]  lisinopril (ZESTRIL) 10 MG tablet Take 10 mg by mouth every evening.   Yes [provider]  Multiple Vitamins-Minerals (CENTRUM SILVER PO) Take 1 tablet by mouth daily.   Yes [provider]  bisacodyl (DULCOLAX) 10 MG suppository Place 1 suppository (10 mg total) rectally daily as needed for moderate constipation. 10/20/21   Geradine Girt, DO  bisacodyl (DULCOLAX) 5 MG EC tablet Take 2 tablets (10 mg total) by mouth daily as needed for moderate constipation or mild constipation. 10/20/21   Geradine Girt, DO  senna (SENOKOT) 8.6 MG TABS tablet Take 2 tablets (17.2 mg total) by mouth daily. 10/20/21   Geradine Girt, DO    Scheduled Meds:  diclofenac Sodium  4 g Topical QID   lisinopril  10 mg Oral QPM   pantoprazole (PROTONIX) IV  40 mg Intravenous Q12H   senna  2 tablet Oral Daily   Infusions:  PRN Meds: acetaminophen **OR** acetaminophen, bisacodyl, hydrALAZINE, naLOXone (NARCAN)  injection, ondansetron (ZOFRAN) IV   Allergies as of 10/17/2021 - Review Complete 10/17/2021  Allergen Reaction Noted   Aspirin  01/26/2017   Macrobid [nitrofurantoin macrocrystal]  01/26/2017   Motrin [ibuprofen] Other (See Comments) 05/23/2020   Polyethylene glycol  01/26/2017   Prednisone  01/26/2017   Red yeast rice [cholestin]  01/26/2017   Sulfa antibiotics  01/26/2017   Codeine Nausea Only 11/10/2016    Family History  Problem Relation Age of Onset   Heart attack Father    Prostate cancer Paternal Grandfather    Colon cancer Neg Hx    Esophageal cancer Neg Hx    Rectal cancer Neg Hx    Stomach cancer Neg Hx     Social History    Socioeconomic History   Marital status: Married    Spouse name: Not on file   Number of children: Not on file   Years of education: Not on file   Highest education level: Not on file  Occupational History   Not on file  Tobacco Use   Smoking status: Never   Smokeless tobacco: Never  Vaping Use   Vaping Use: Never used  Substance and Sexual Activity   Alcohol use: No   Drug use: No   Sexual activity: Not on file  Other Topics Concern   Not on file  Social History Narrative   Not on file   Social Determinants of Health   Financial Resource Strain: Not on file  Food Insecurity: Not on file  Transportation Needs: Not on file  Physical Activity: Not on file  Stress: Not on file  Social Connections: Not on file  Intimate Partner Violence: Not on file    REVIEW OF SYSTEMS: Constitutional: Some weakness, not profound. ENT:  No nose bleeds.  Hearing loss.  Dry eyes Pulm: Denies shortness of breath and cough. CV:  No palpitations, no LE edema.  GU:  No hematuria, no frequency GI: No dysphagia.  See HPI. Heme: Denies unusual bleeding or bruising. Transfusions: None per record Neuro:  No headaches, no peripheral tingling or numbness Derm:  No itching, no rash or sores.  Endocrine:  No sweats or chills.  No polyuria or dysuria Immunization: Not queried.  No records of vaccinations in the chart. Travel: Not queried.   PHYSICAL EXAM: Vital signs in last 24 hours: Vitals:   10/21/21 0344 10/21/21 0817  BP: 137/76 137/79  Pulse: 76 75  Resp: 14 17  Temp: 98.2 F (36.8 C) 99.3 F (37.4 C)  SpO2: 93% 94%   Wt Readings from Last 3 Encounters:  10/21/21 48.6 kg  06/10/20 43.1 kg  05/16/20 46.3 kg    General: Somewhat frail, pleasant, hard of hearing Head: No facial asymmetry or swelling.  No signs of head trauma. Eyes: Conjunctiva slightly pale.  No scleral icterus.  EOMI. Ears: Hard of hearing but if you speak loud, slowly, clearly she can understand fine. Nose:  No congestion or discharge. Mouth: Good dentition.  Tongue midline.  Mucosa moist, pink, clears. Neck: No JVD, no masses, no thyromegaly Lungs: Clear bilaterally.  Excellent breath sounds.  No labored breathing.  No cough Heart: RRR.  No MRG.  S1, S2 present. Abdomen: Not tender, not distended.  No HSM, masses, bruits, hernias.  Containing coffee-ground material visualized, saved from episode yesterday. Rectal: Deferred. Musc/Skeltl: Osteopenia. Extremities: No CCE.  Feet are warm. Neurologic: Alert.  Hard of hearing.  Appropriate.  Not confused.  No tremors.  Moves all 4 limbs, strength not tested. Skin: No significant bruising Nodes: No cervical adenopathy Psych: Calm, pleasant, cooperative.  Intake/Output from previous day: 07/03 0701 - 07/04 0700 In: -  Out: 500 [Urine:500] Intake/Output this shift: No intake/output data recorded.  LAB RESULTS: Recent Labs    10/20/21 0426 10/21/21 0048  WBC 9.1 12.8*  HGB 11.7* 12.9  HCT 36.3 39.1  PLT 252 306   BMET Lab Results  Component Value Date   NA 140 10/20/2021   NA 140 10/18/2021   NA 140 10/17/2021   K 3.3 (L) 10/20/2021   K 4.0 10/18/2021   K 3.4 (L) 10/17/2021   CL 111 10/20/2021   CL 108 10/18/2021   CL 103 10/17/2021   CO2 23 10/20/2021   CO2 27 10/18/2021   CO2 28 10/17/2021   GLUCOSE 94 10/20/2021   GLUCOSE 102 (H) 10/18/2021   GLUCOSE 168 (H) 10/17/2021   BUN 11 10/20/2021   BUN 10 10/18/2021   BUN 15 10/17/2021   CREATININE 0.40 (L) 10/20/2021   CREATININE 0.65 10/18/2021   CREATININE 0.59 10/17/2021   CALCIUM 9.1 10/20/2021   CALCIUM 9.4 10/18/2021   CALCIUM 10.0 10/17/2021   LFT No results for input(s): "PROT", "ALBUMIN", "AST", "ALT", "ALKPHOS", "BILITOT", "BILIDIR", "IBILI" in the last 72 hours. PT/INR No results found for: "INR", "PROTIME" Hepatitis Panel No results for input(s): "HEPBSAG", "HCVAB", "HEPAIGM", "HEPBIGM" in the last 72 hours. C-Diff No components found for: "CDIFF" Lipase      Component Value Date/Time   LIPASE 30 10/17/2021 1714    Drugs of  Abuse  No results found for: "LABOPIA", "COCAINSCRNUR", "LABBENZ", "AMPHETMU", "THCU", "LABBARB"   RADIOLOGY STUDIES: No results found.   IMPRESSION:     PSBO resolved after reduction.  CGE, single episode following multiple prior episodes of non-CGE emesis.  History nonbleeding gastric ulcer in 2020.  Patient no longer on acid suppressing medications at home.    PLAN:        EGD, timing TBD.  Keep her n.p.o. for now in case we can do this EGD this afternoon.    KUB completed, not read yet.    Ordered normal saline at 50 cc/hour for 8 hours.  Azucena Freed  10/21/2021, 9:46 AM Phone [phone]

## 2021-10-21 NOTE — Progress Notes (Signed)
Mobility Specialist Progress Note   10/21/21 1540  Mobility  Activity Ambulated with assistance in hallway  Level of Assistance Contact guard assist, steadying assist  Assistive Device Front wheel walker  Distance Ambulated (ft) 140 ft  Activity Response Tolerated well  $Mobility charge 1 Mobility   Patient received in bed having slight discomfort in stomach but agreeable to mobility. Pt acting more independently today requesting to do task on there own. Pt progressing ambulation distance well without complaint or incident. Was left back in bed with all needs met, call bell in reach and NT present.   Holland Falling Mobility Specialist MS [facility] #:  [phone] Acute Rehab Office:  [phone]

## 2021-10-21 NOTE — Progress Notes (Addendum)
Unable to send gastric contents down for occult testing due to no gastric cards on the floor. Charge nurse helped look for gastric cards, called ED, lab , and materials. Unable to obtain card. Will pass off to oncoming nurse.

## 2021-10-22 ENCOUNTER — Encounter (HOSPITAL_COMMUNITY): Payer: Self-pay | Admitting: Internal Medicine

## 2021-10-22 ENCOUNTER — Encounter (HOSPITAL_COMMUNITY)
Admission: EM | Disposition: A | Discharge status: Home Health | Payer: Self-pay | Source: Home / Self Care | Attending: Internal Medicine

## 2021-10-22 ENCOUNTER — Inpatient Hospital Stay (HOSPITAL_COMMUNITY): Payer: Medicare Other | Admitting: Anesthesiology

## 2021-10-22 DIAGNOSIS — K4090 Unilateral inguinal hernia, without obstruction or gangrene, not specified as recurrent: Secondary | ICD-10-CM

## 2021-10-22 DIAGNOSIS — K56609 Unspecified intestinal obstruction, unspecified as to partial versus complete obstruction: Secondary | ICD-10-CM | POA: Diagnosis not present

## 2021-10-22 DIAGNOSIS — I10 Essential (primary) hypertension: Secondary | ICD-10-CM

## 2021-10-22 DIAGNOSIS — K2289 Other specified disease of esophagus: Secondary | ICD-10-CM

## 2021-10-22 DIAGNOSIS — M1990 Unspecified osteoarthritis, unspecified site: Secondary | ICD-10-CM | POA: Diagnosis not present

## 2021-10-22 DIAGNOSIS — K449 Diaphragmatic hernia without obstruction or gangrene: Secondary | ICD-10-CM | POA: Diagnosis not present

## 2021-10-22 DIAGNOSIS — K920 Hematemesis: Secondary | ICD-10-CM

## 2021-10-22 HISTORY — DX: Unilateral inguinal hernia, without obstruction or gangrene, not specified as recurrent: K40.90

## 2021-10-22 HISTORY — PX: ESOPHAGOGASTRODUODENOSCOPY (EGD) WITH PROPOFOL: SHX5813

## 2021-10-22 SURGERY — ESOPHAGOGASTRODUODENOSCOPY (EGD) WITH PROPOFOL
Anesthesia: Monitor Anesthesia Care

## 2021-10-22 MED ORDER — MINERAL OIL PO OIL
960.0000 mL | TOPICAL_OIL | Freq: Once | ORAL | Status: AC
Start: 2021-10-22 — End: 2021-10-22
  Administered 2021-10-22: 960 mL via RECTAL
  Filled 2021-10-22: qty 473

## 2021-10-22 MED ORDER — PROPOFOL 500 MG/50ML IV EMUL
INTRAVENOUS | Status: DC | PRN
  Administered 2021-10-22: 150 ug/kg/min via INTRAVENOUS

## 2021-10-22 MED ORDER — PROPOFOL 10 MG/ML IV BOLUS
INTRAVENOUS | Status: DC | PRN
  Administered 2021-10-22 (×2): 20 mg via INTRAVENOUS

## 2021-10-22 MED ORDER — SODIUM CHLORIDE 0.9 % IV SOLN: INTRAVENOUS | Status: DC

## 2021-10-22 MED ORDER — MAGNESIUM HYDROXIDE 400 MG/5ML PO SUSP
960.0000 mL | Freq: Once | ORAL | Status: AC
Start: 2021-10-22 — End: 2021-10-22
  Filled 2021-10-22: qty 473

## 2021-10-22 SURGICAL SUPPLY — 15 items

## 2021-10-22 NOTE — Progress Notes (Signed)
Mobility Specialist Progress Note   10/22/21 1424  Mobility  Activity Transferred to/from [facility]  Level of Assistance +2 (takes two people)  Information systems manager Ambulated (ft) 4 ft  Activity Response Tolerated well  $Mobility charge 1 Mobility   Son requesting assistance to get pt BTB d/t cramping in LE, pt found on BSC. Successful BM and void found as well. +2 minA used for safety during transfer. Left supine in bed w/ call bell in reach and needs met.  Holland Falling Mobility Specialist MS [facility] #:  [phone] Acute Rehab Office:  [phone]

## 2021-10-22 NOTE — Progress Notes (Signed)
Progress Note  Day of Surgery  Subjective: Pt denies abdominal pain this AM. Denies further hematemesis. Had very small BM yesterday that was 3 small pellets. Passing small amount flatus. Son at bedside this AM.   Objective: Vital signs in last 24 hours: Temp:  [97.8 F (36.6 C)-98.7 F (37.1 C)] 97.8 F (36.6 C) (07/05 0933) Pulse Rate:  [54-66] 55 (07/05 0933) Resp:  [14-16] 15 (07/05 0933) BP: (113-143)/(67-88) 130/85 (07/05 0933) SpO2:  [92 %-98 %] 95 % (07/05 0933) Weight:  [46.3 kg] 46.3 kg (07/05 0500) Last BM Date : 10/21/21  Intake/Output from previous day: 07/04 0701 - 07/05 0700 In: 826.1 [I.V.:826.1] Out: 800 [Urine:800] Intake/Output this shift: Total I/O In: 100 [I.V.:100] Out: -   PE: General: pleasant, WD, elderly female who is laying in bed in NAD Heart: regular, rate, and rhythm.   Lungs: Respiratory effort nonlabored Abd: soft, NT, ND, BS hypoactive Psych: A&Ox3 with an appropriate affect.    Lab Results:  Recent Labs    10/21/21 0048 10/21/21 0937  WBC 12.8* 7.3  HGB 12.9 11.8*  HCT 39.1 34.8*  PLT 306 258   BMET Recent Labs    10/20/21 0426 10/21/21 0937  NA 140 138  K 3.3* 4.1  CL 111 106  CO2 23 26  GLUCOSE 94 100*  BUN 11 9  CREATININE 0.40* 0.40*  CALCIUM 9.1 10.3   PT/INR No results for input(s): "LABPROT", "INR" in the last 72 hours. CMP     Component Value Date/Time   NA 138 10/21/2021 0937   K 4.1 10/21/2021 0937   CL 106 10/21/2021 0937   CO2 26 10/21/2021 0937   GLUCOSE 100 (H) 10/21/2021 0937   BUN 9 10/21/2021 0937   CREATININE 0.40 (L) 10/21/2021 0937   CALCIUM 10.3 10/21/2021 0937   PROT 5.6 (L) 10/21/2021 0937   ALBUMIN 3.0 (L) 10/21/2021 0937   AST 18 10/21/2021 0937   ALT 18 10/21/2021 0937   ALKPHOS 47 10/21/2021 0937   BILITOT 0.5 10/21/2021 0937   GFRNONAA >60 10/21/2021 0937   Lipase     Component Value Date/Time   LIPASE 30 10/17/2021 1714       Studies/Results: DG Abd 1  View  Result Date: 10/21/2021 CLINICAL DATA:  Abdominal pain. EXAM: ABDOMEN - 1 VIEW COMPARISON:  CT scan 10/17/2021 FINDINGS: No gaseous small bowel dilatation evident to suggest small bowel obstruction. Prominent stool volume noted in the colon. Thoracolumbar scoliosis evident with diffuse bony demineralization. Degenerative changes are noted in the hips. IMPRESSION: 1. Compared to the scout image for CT scan 10/17/2021, overall generalized decrease in small bowel gas with prominent residual stool volume. Electronically Signed   By: Misty Stanley M.D.   On: 10/21/2021 12:00    Anti-infectives: Anti-infectives (From admission, onward)    None        Assessment/Plan SBO secondary to [facility] - reduced, follow up outpatient to discuss elective hernia repair, this appointment is in AVS Chronic constipation - significant stool burden still noted on imaging, recommend SMOG today  Hematemesis - EGD today per GI  No indication for emergent surgical intervention.   FEN: NPO, IVF'@50cc'$ /h VTE: SCDs ID: no current abx  HTN - home meds per [facility]  LOS: 5 days   I reviewed Consultant GI notes, hospitalist notes, last 24 h vitals and pain scores, last 48 h intake and output, last 24 h labs and trends, and last 24 h imaging results.   Norm Parcel,  [facility] Surgery 10/22/2021, 10:11 AM Please see Amion for pager number during day hours 7:00am-4:30pm

## 2021-10-22 NOTE — Anesthesia Procedure Notes (Signed)
Procedure Name: MAC Date/Time: 10/22/2021 10:02 AM  Performed by: Wilburn Cornelia, CRNAPre-anesthesia Checklist: Patient identified, Emergency Drugs available, Suction available, Patient being monitored and Timeout performed Oxygen Delivery Method: Nasal cannula Placement Confirmation: positive ETCO2 and breath sounds checked- equal and bilateral Dental Injury: Teeth and Oropharynx as per pre-operative assessment

## 2021-10-22 NOTE — Progress Notes (Signed)
Mobility Specialist Progress Note  Canceled: Pt received on BSC having just received an enema and requesting to stay on BSC until they have a BM. Pt prepping for d/c home and requiring no further assistance. Mobility Specialist no longer required.  Holland Falling Mobility Specialist MS [facility] #:  [phone] Acute Rehab Office:  [phone]

## 2021-10-22 NOTE — TOC Transition Note (Signed)
Transition of Care [facility]) - CM/SW Discharge Note   Patient Details  Name: Jodi Jefferson MRN: [id number] Date of Birth: 1932/04/09  [facility] CM/SW Contact:  Pollie Friar, RN Phone Number: 10/22/2021, 1:38 PM   Clinical Narrative:    Patient is discharging home with [facility]. Information on the AVS. 3 in 1 delivered to the room per [employer].  Pt has transportation home.    Final next level of care: [place] Barriers to Discharge: No Barriers Identified   Patient Goals and CMS Choice Patient states their goals for this hospitalization and ongoing recovery are:: to return to home CMS Medicare.gov Compare Post Acute Care list provided to:: Patient Choice offered to / list presented to : Patient  Discharge Placement                       Discharge Plan and Services   Discharge Planning Services: CM Consult Post Acute Care Choice: Home Health          DME Arranged: 3-N-1 DME Agency: [employer] Date DME Agency Contacted: 10/20/21 Time DME Agency Contacted: 1204 Representative spoke with at DME Agency: [place]: PT [place]: Well [place] Date [place]: 10/22/21   Representative spoke with at [place]: [place] ([place]) Interventions     Readmission Risk Interventions     No data to display

## 2021-10-22 NOTE — Anesthesia Postprocedure Evaluation (Signed)
Anesthesia Post Note  Patient: Jodi Jefferson  Procedure(s) Performed: ESOPHAGOGASTRODUODENOSCOPY (EGD) WITH PROPOFOL     Patient location during evaluation: PACU Anesthesia Type: MAC Level of consciousness: awake and alert Pain management: pain level controlled Vital Signs Assessment: post-procedure vital signs reviewed and stable Respiratory status: spontaneous breathing, nonlabored ventilation, respiratory function stable and patient connected to nasal cannula oxygen Cardiovascular status: stable and blood pressure returned to baseline Postop Assessment: no apparent nausea or vomiting Anesthetic complications: no   No notable events documented.  Last Vitals:  Vitals:   10/22/21 1030 10/22/21 1154  BP: (!) 109/59 124/66  Pulse: (!) 50 (!) 52  Resp: 20 16  Temp: 36.4 C 36.7 C  SpO2: 96% 98%    Last Pain:  Vitals:   10/22/21 1154  TempSrc: Oral  PainSc:                  [name]

## 2021-10-22 NOTE — Transfer of Care (Signed)
Immediate Anesthesia Transfer of Care Note  Patient: Jodi Jefferson  Procedure(s) Performed: ESOPHAGOGASTRODUODENOSCOPY (EGD) WITH PROPOFOL  Patient Location: PACU  Anesthesia Type:MAC  Level of Consciousness: drowsy  Airway & Oxygen Therapy: Patient Spontanous Breathing and Patient connected to nasal cannula oxygen  Post-op Assessment: Report given to RN and Post -op Vital signs reviewed and stable  Post vital signs: Reviewed and stable  Last Vitals:  Vitals Value Taken Time  BP 109/61   Temp    Pulse 51 10/22/21 1015  Resp 19 10/22/21 1015  SpO2 99 % 10/22/21 1015  Vitals shown include unvalidated device data.  Last Pain:  Vitals:   10/22/21 0933  TempSrc:   PainSc: 0-No pain      Patients Stated Pain Goal: 1 (60/67/70 3403)  Complications: No notable events documented.

## 2021-10-22 NOTE — Discharge Summary (Addendum)
Physician Discharge Summary   Jodi Jefferson BOF:[id number] DOB: January 14, 1932 DOA: 10/17/2021  PCP: [name], MD  Admit date: 10/17/2021 Discharge date: 10/22/2021   Admitted From: Home Disposition:  Home Discharging physician: Dwyane Dee, MD  Recommendations for Outpatient Follow-up:  Follow up with general surgery  Home Health:  Equipment/Devices:   Discharge Condition: stable CODE STATUS: Full Diet recommendation:  Diet Orders (From admission, onward)     Start     Ordered   10/22/21 1105  Diet regular Room service appropriate? Yes; Fluid consistency: Thin  Diet effective now       Question Answer Comment  Room service appropriate? Yes   Fluid consistency: Thin      10/22/21 1104   10/22/21 0000  Diet general        10/22/21 1306   10/20/21 0000  Diet general        10/20/21 [place] [facility] Course:  Jodi Jefferson is an 88 y.o. female with medical history significant for essential hypertension, PUD who was admitted to [facility] on 10/17/2021 with partial small bowel obstruction after presenting from home to [facility] ED complaining of abdominal pain.  Found to have pSBO due to a trapped right inguinal hernia. This was reduced easily on admission and general surgery was also consulted.  She will follow-up outpatient for hernia repair. Prior to discharge initially, she developed a reported episode of coffee ground emesis.  She was evaluated by GI and underwent EGD on 10/22/2021.  This showed some erythema at the GE junction but no obvious ulcers nor any other acute findings.  She was recommended to be discontinued from her PPI with no further outpatient GI follow-up. From a hernia standpoint, she was recommended for maintaining a strict bowel regimen to prevent any worsening constipation which was also noted on admission imaging.    The patient's chronic medical conditions were treated accordingly per the patient's home medication  regimen except as noted.  On day of discharge, patient was felt deemed stable for discharge. Patient/family member advised to call PCP or come back to ER if needed.   Principal Diagnosis: SBO (small bowel obstruction) ([place])  Discharge Diagnoses: Active Problems:   Essential hypertension   Hypokalemia   Inguinal hernia   Discharge Instructions     Diet general   Complete by: As directed    Diet general   Complete by: As directed    Discharge instructions   Complete by: As directed    Needs aggressive bowel regimen to avoid constipation   Increase activity slowly   Complete by: As directed    Increase activity slowly   Complete by: As directed       Allergies as of 10/22/2021       Reactions   Aspirin Other (See Comments)   Stomach burns   Macrobid [nitrofurantoin Macrocrystal] Other (See Comments)   Unknown reaction   Motrin [ibuprofen] Other (See Comments)   GI intolerance   Polyethylene Glycol Other (See Comments)   Facial flushing Tingling    Prednisone Other (See Comments)   Unknown reaction   Red Yeast Rice [cholestin] Other (See Comments)   Unknown reaction   Sulfa Antibiotics Other (See Comments)   Unknown reaction   Codeine Nausea Only        Medication List     TAKE these medications    acetaminophen 650 MG CR tablet Commonly known as: TYLENOL  Take 650 mg by mouth as needed.   amoxicillin 500 MG capsule Commonly known as: AMOXIL Take 2,000 mg by mouth See admin instructions. Take 4 capsules (2000 mg) by mouth one hour prior to dental appointments   bisacodyl 5 MG EC tablet Commonly known as: DULCOLAX Take 2 tablets (10 mg total) by mouth daily as needed for moderate constipation or mild constipation.   bisacodyl 10 MG suppository Commonly known as: DULCOLAX Place 1 suppository (10 mg total) rectally daily as needed for moderate constipation.   calcium carbonate 750 MG chewable tablet Commonly known as: TUMS EX Chew 750 mg by mouth daily  as needed for heartburn.   carboxymethylcellulose 0.5 % Soln Commonly known as: REFRESH PLUS Place 1 drop into both eyes 4 (four) times daily as needed (dry eyes).   CENTRUM SILVER PO Take 1 tablet by mouth daily.   cyanocobalamin 1000 MCG/ML injection Commonly known as: (VITAMIN B-12) Inject 1,000 mcg into the muscle every 3 (three) months.   denosumab 60 MG/ML Sosy injection Commonly known as: PROLIA Inject 60 mg into the skin every 6 (six) months.   lisinopril 10 MG tablet Commonly known as: ZESTRIL Take 10 mg by mouth every evening.   senna 8.6 MG Tabs tablet Commonly known as: SENOKOT Take 2 tablets (17.2 mg total) by mouth daily.               Durable Medical Equipment  (From admission, onward)           Start     Ordered   10/20/21 1122  For home use only DME 3 n 1  Once        10/20/21 1121            Follow-up Information     Jacqlyn Larsen II, MD Follow up in 1 week(s).   Specialty: Family Medicine Contact information: [place] [address] [phone]         Ralene Ok, MD. Go on 11/18/2021.   Specialty: General Surgery Why: at 2:30 PM to discuss hernia repair. please arrive by 2:00PM and bring photo ID/insurance information. Contact information: [address] [place] [address] [phone]         Golda Acre, [facility] [place] Follow up.   Specialty: Home Health Services Contact information: [place] [address] [phone]                Allergies  Allergen Reactions   Aspirin Other (See Comments)    Stomach burns   Macrobid [Nitrofurantoin Macrocrystal] Other (See Comments)    Unknown reaction   Motrin [Ibuprofen] Other (See Comments)    GI intolerance   Polyethylene Glycol Other (See Comments)    Facial flushing Tingling    Prednisone Other (See Comments)    Unknown reaction   Red Yeast Rice [Cholestin] Other (See Comments)     Unknown reaction   Sulfa Antibiotics Other (See Comments)    Unknown reaction   Codeine Nausea Only    Consultations: GI General surgery   Procedures: EGD, 10/22/21  Discharge Exam: BP 124/66 (BP Location: Right Arm)   Pulse (!) 52   Temp 98 F (36.7 C) (Oral)   Resp 16   Ht 4' 10.5" (1.486 m)   Wt 46.3 kg   SpO2 98%   BMI 20.97 kg/m  Physical Exam Constitutional:      Appearance: She is well-developed.  HENT:  Head: Normocephalic and atraumatic.     Mouth/Throat:     Mouth: Mucous membranes are moist.  Eyes:     Extraocular Movements: Extraocular movements intact.  Cardiovascular:     Rate and Rhythm: Normal rate and regular rhythm.  Pulmonary:     Effort: Pulmonary effort is normal.     Breath sounds: Normal breath sounds.  Abdominal:     General: Bowel sounds are normal. There is no distension.     Palpations: Abdomen is soft.     Tenderness: There is no abdominal tenderness.     Hernia: No hernia is present.  Musculoskeletal:        General: Normal range of motion.     Cervical back: Normal range of motion and neck supple.  Skin:    General: Skin is warm and dry.  Neurological:     General: No focal deficit present.     Mental Status: She is alert.  Psychiatric:        Mood and Affect: Mood normal.        Behavior: Behavior normal.      The results of significant diagnostics from this hospitalization (including imaging, microbiology, ancillary and laboratory) are listed below for reference.   Microbiology: Recent Results (from the past 240 hour(s))  Urine Culture     Status: None   Collection Time: 10/17/21  9:54 PM   Specimen: Urine, Clean Catch  Result Value Ref Range Status   Specimen Description URINE, CLEAN CATCH  Final   Special Requests NONE  Final   Culture   Final    NO GROWTH Performed at [facility], [address], [place], [place] [address]    Report Status 10/19/2021 FINAL  Final     Labs: BNP (last 3 results) No  results for input(s): "BNP" in the last 8760 hours. Basic Metabolic Panel: Recent Labs  Lab 10/17/21 1714 10/18/21 0417 10/20/21 0426 10/21/21 0937  NA 140 140 140 138  K 3.4* 4.0 3.3* 4.1  CL 103 108 111 106  CO2 '28 27 23 26  '$ GLUCOSE 168* 102* 94 100*  BUN '15 10 11 9  '$ CREATININE 0.59 0.65 0.40* 0.40*  CALCIUM 10.0 9.4 9.1 10.3  MG 2.3 2.4  --   --    Liver Function Tests: Recent Labs  Lab 10/17/21 1714 10/18/21 0417 10/21/21 0937  AST '22 23 18  '$ ALT '18 17 18  '$ ALKPHOS 61 51 47  BILITOT 0.6 0.6 0.5  PROT 7.2 6.1* 5.6*  ALBUMIN 4.0 3.3* 3.0*   Recent Labs  Lab 10/17/21 1714  LIPASE 30   No results for input(s): "AMMONIA" in the last 168 hours. CBC: Recent Labs  Lab 10/17/21 1714 10/18/21 0417 10/20/21 0426 10/21/21 0048 10/21/21 0937  WBC 18.0* 12.5* 9.1 12.8* 7.3  NEUTROABS  --  9.6*  --   --   --   HGB 13.3 11.4* 11.7* 12.9 11.8*  HCT 41.1 36.2 36.3 39.1 34.8*  MCV 96.0 97.6 96.5 94.4 95.3  PLT 272 255 252 306 258   Cardiac Enzymes: No results for input(s): "CKTOTAL", "CKMB", "CKMBINDEX", "TROPONINI" in the last 168 hours. BNP: Invalid input(s): "POCBNP" CBG: No results for input(s): "GLUCAP" in the last 168 hours. D-Dimer No results for input(s): "DDIMER" in the last 72 hours. Hgb A1c No results for input(s): "HGBA1C" in the last 72 hours. Lipid Profile No results for input(s): "CHOL", "HDL", "LDLCALC", "TRIG", "CHOLHDL", "LDLDIRECT" in the last 72 hours. Thyroid function studies  No results for input(s): "TSH", "T4TOTAL", "T3FREE", "THYROIDAB" in the last 72 hours.  Invalid input(s): "FREET3" Anemia work up No results for input(s): "VITAMINB12", "FOLATE", "FERRITIN", "TIBC", "IRON", "RETICCTPCT" in the last 72 hours. Urinalysis    Component Value Date/Time   COLORURINE YELLOW 10/17/2021 2211   APPEARANCEUR CLOUDY (A) 10/17/2021 2211   LABSPEC 1.015 10/17/2021 2211   PHURINE 7.0 10/17/2021 2211   GLUCOSEU NEGATIVE 10/17/2021 2211   HGBUR  NEGATIVE 10/17/2021 2211   BILIRUBINUR NEGATIVE 10/17/2021 2211   KETONESUR NEGATIVE 10/17/2021 2211   PROTEINUR 30 (A) 10/17/2021 2211   NITRITE NEGATIVE 10/17/2021 2211   LEUKOCYTESUR NEGATIVE 10/17/2021 2211   Sepsis Labs Recent Labs  Lab 10/18/21 0417 10/20/21 0426 10/21/21 0048 10/21/21 0937  WBC 12.5* 9.1 12.8* 7.3   Microbiology Recent Results (from the past 240 hour(s))  Urine Culture     Status: None   Collection Time: 10/17/21  9:54 PM   Specimen: Urine, Clean Catch  Result Value Ref Range Status   Specimen Description URINE, CLEAN CATCH  Final   Special Requests NONE  Final   Culture   Final    NO GROWTH Performed at [facility], [place] [address] [place], [place] [address]    Report Status 10/19/2021 FINAL  Final    Procedures/Studies: DG Abd 1 View  Result Date: 10/21/2021 CLINICAL DATA:  Abdominal pain. EXAM: ABDOMEN - 1 VIEW COMPARISON:  CT scan 10/17/2021 FINDINGS: No gaseous small bowel dilatation evident to suggest small bowel obstruction. Prominent stool volume noted in the colon. Thoracolumbar scoliosis evident with diffuse bony demineralization. Degenerative changes are noted in the hips. IMPRESSION: 1. Compared to the scout image for CT scan 10/17/2021, overall generalized decrease in small bowel gas with prominent residual stool volume. Electronically Signed   By: Misty Stanley M.D.   On: 10/21/2021 12:00   CT ABDOMEN PELVIS W CONTRAST  Result Date: 10/17/2021 CLINICAL DATA:  Left-sided abdominal pain EXAM: CT ABDOMEN AND PELVIS WITH CONTRAST TECHNIQUE: Multidetector CT imaging of the abdomen and pelvis was performed using the standard protocol following bolus administration of intravenous contrast. RADIATION DOSE REDUCTION: This exam was performed according to the departmental dose-optimization program which includes automated exposure control, adjustment of the mA and/or kV according to patient size and/or use of iterative reconstruction technique.  CONTRAST:  42m OMNIPAQUE IOHEXOL 300 MG/ML  SOLN COMPARISON:  12/13/2018 FINDINGS: Lower chest: No acute abnormality. Hepatobiliary: No focal liver abnormality is seen. No gallstones, gallbladder wall thickening, or biliary dilatation. Pancreas: Unremarkable. No pancreatic ductal dilatation or surrounding inflammatory changes. Spleen: Normal in size without focal abnormality. Adrenals/Urinary Tract: Adrenal glands are within normal limits. Kidneys demonstrate a normal enhancement pattern bilaterally. No renal calculi or obstructive changes are seen. Bladder is well distended. Stomach/Bowel: Fecal material is noted throughout the colon consistent with a degree of constipation. The appendix is not well visualized and may have been surgically removed. Stomach is decompressed. Multiple fluid-filled loops of small bowel are identified in the mid abdomen which extend to a right inguinal hernia containing multiple loops of small bowel. Vascular/Lymphatic: Aortic atherosclerosis. No enlarged abdominal or pelvic lymph nodes. Reproductive: Status post hysterectomy. No adnexal masses. Other: Right inguinal hernia as described above. No abdominopelvic ascites. Musculoskeletal: Degenerative changes of left hip joint are seen. No acute bony abnormality is noted. IMPRESSION: Partial small bowel obstruction secondary to herniated loops of small bowel in the right inguinal hernia. Mild colonic constipation. Electronically Signed   By: MLinus MakoD.  On: 10/17/2021 22:38     Time coordinating discharge: Over 30 minutes    Dwyane Dee, MD  [facility] 10/22/2021, 5:44 PM

## 2021-10-22 NOTE — Interval H&P Note (Signed)
History and Physical Interval Note:  10/22/2021 9:59 AM  Jodi Jefferson  has presented today for surgery, with the diagnosis of Evaluate for dark vomit.  The various methods of treatment have been discussed with the patient and family. After consideration of risks, benefits and other options for treatment, the patient has consented to  Procedure(s): ESOPHAGOGASTRODUODENOSCOPY (EGD) WITH PROPOFOL (N/A) as a surgical intervention.  The patient's history has been reviewed, patient examined, no change in status, stable for surgery.  I have reviewed the patient's chart and labs.  Questions were answered to the patient's satisfaction.     Silvano Rusk

## 2021-10-22 NOTE — Anesthesia Preprocedure Evaluation (Signed)
Anesthesia Evaluation  Patient identified by MRN, date of birth, ID band Patient awake    Reviewed: Allergy & Precautions, NPO status , Patient's Chart, lab work & pertinent test results  Airway Mallampati: II  TM Distance: >3 FB Neck ROM: Full    Dental  (+) Dental Advisory Given   Pulmonary neg pulmonary ROS,    breath sounds clear to auscultation       Cardiovascular hypertension, Pt. on medications  Rhythm:Regular Rate:Normal     Neuro/Psych negative neurological ROS     GI/Hepatic Neg liver ROS, GERD  ,  Endo/Other  negative endocrine ROS  Renal/GU negative Renal ROS     Musculoskeletal  (+) Arthritis ,   Abdominal   Peds  Hematology negative hematology ROS (+)   Anesthesia Other Findings   Reproductive/Obstetrics                             Anesthesia Physical Anesthesia Plan  ASA: 3  Anesthesia Plan: MAC   Post-op Pain Management: Minimal or no pain anticipated   Induction:   PONV Risk Score and Plan: 2 and Propofol infusion and Treatment may vary due to age or medical condition  Airway Management Planned: Natural Airway and Nasal Cannula  Additional Equipment:   Intra-op Plan:   Post-operative Plan:   Informed Consent: I have reviewed the patients History and Physical, chart, labs and discussed the procedure including the risks, benefits and alternatives for the proposed anesthesia with the patient or authorized representative who has indicated his/her understanding and acceptance.       Plan Discussed with:   Anesthesia Plan Comments:         Anesthesia Quick Evaluation

## 2021-10-22 NOTE — Op Note (Signed)
[facility] Patient Name: Jodi Jefferson Procedure Date : 10/22/2021 MRN: [id number] Attending MD: Gatha Mayer , MD Date of Birth: 06-24-1931 CSN: [id number] Age: 88 Admit Type: Inpatient Procedure:                Upper GI endoscopy Indications:              Coffee-ground emesis Providers:                Gatha Mayer, MD, Jaci Carrel, RN, William Dalton, Technician, Darliss Cheney, Technician Referring MD:              Medicines:                Monitored Anesthesia Care Complications:            No immediate complications. Estimated Blood Loss:     Estimated blood loss: none. Procedure:                Pre-Anesthesia Assessment:                           - Prior to the procedure, a History and Physical                            was performed, and patient medications and                            allergies were reviewed. The patient's tolerance of                            previous anesthesia was also reviewed. The risks                            and benefits of the procedure and the sedation                            options and risks were discussed with the patient.                            All questions were answered, and informed consent                            was obtained. Prior Anticoagulants: The patient has                            taken no previous anticoagulant or antiplatelet                            agents. ASA Grade Assessment: III - A patient with                            severe systemic disease. After reviewing the risks  and benefits, the patient was deemed in                            satisfactory condition to undergo the procedure.                           After obtaining informed consent, the endoscope was                            passed under direct vision. Throughout the                            procedure, the patient's blood pressure, pulse, and                             oxygen saturations were monitored continuously. The                            [id number]-[id number] ([id number]) Olympus endoscope was introduced                            through the mouth, and advanced to the second part                            of duodenum. The upper GI endoscopy was                            accomplished without difficulty. The patient                            tolerated the procedure well. Scope In: Scope Out: Findings:      Localized mild erythema was found at the gastroesophageal junction.      A small hiatal hernia was present.      The exam was otherwise without abnormality.      The cardia and gastric fundus were normal on retroflexion. Impression:               - Erythema at the gastroesophageal junction.                           - Small hiatal hernia.                           - The examination was otherwise normal.                           - No specimens collected. Recommendation:           - I think vomiting was from the SBO.                           No sign of bleeding here.                           I have stopped and do not think she needs a chronic  PPI                           Signing off - does not need GI clinic visit post-dc                           Spoke to son Procedure Code(s):        --- Professional ---                           [phone], Esophagogastroduodenoscopy, flexible,                            transoral; diagnostic, including collection of                            specimen(s) by brushing or washing, when performed                            (separate procedure) Diagnosis Code(s):        --- Professional ---                           K22.8, Other specified diseases of esophagus                           K44.9, Diaphragmatic hernia without obstruction or                            gangrene                           K92.0, Hematemesis CPT copyright 2019 [employer]. All rights reserved. The codes  documented in this report are preliminary and upon coder review may  be revised to meet current compliance requirements. Gatha Mayer, MD 10/22/2021 10:25:29 AM This report has been signed electronically. Number of Addenda: 0

## 2021-10-30 DIAGNOSIS — K56609 Unspecified intestinal obstruction, unspecified as to partial versus complete obstruction: Secondary | ICD-10-CM | POA: Diagnosis not present

## 2021-10-30 DIAGNOSIS — D510 Vitamin B12 deficiency anemia due to intrinsic factor deficiency: Secondary | ICD-10-CM | POA: Diagnosis not present

## 2021-10-30 DIAGNOSIS — K4090 Unilateral inguinal hernia, without obstruction or gangrene, not specified as recurrent: Secondary | ICD-10-CM | POA: Diagnosis not present

## 2021-10-30 DIAGNOSIS — Z6820 Body mass index (BMI) 20.0-20.9, adult: Secondary | ICD-10-CM | POA: Diagnosis not present

## 2021-11-18 DIAGNOSIS — K4090 Unilateral inguinal hernia, without obstruction or gangrene, not specified as recurrent: Secondary | ICD-10-CM | POA: Diagnosis not present

## 2021-11-19 DIAGNOSIS — M25562 Pain in left knee: Secondary | ICD-10-CM | POA: Diagnosis not present

## 2021-11-19 DIAGNOSIS — M1712 Unilateral primary osteoarthritis, left knee: Secondary | ICD-10-CM | POA: Diagnosis not present

## 2021-11-20 DIAGNOSIS — Z90710 Acquired absence of both cervix and uterus: Secondary | ICD-10-CM | POA: Diagnosis not present

## 2021-11-20 DIAGNOSIS — M169 Osteoarthritis of hip, unspecified: Secondary | ICD-10-CM | POA: Diagnosis not present

## 2021-11-20 DIAGNOSIS — K56600 Partial intestinal obstruction, unspecified as to cause: Secondary | ICD-10-CM | POA: Diagnosis not present

## 2021-11-20 DIAGNOSIS — Z9181 History of falling: Secondary | ICD-10-CM | POA: Diagnosis not present

## 2021-11-20 DIAGNOSIS — F419 Anxiety disorder, unspecified: Secondary | ICD-10-CM | POA: Diagnosis not present

## 2021-11-20 DIAGNOSIS — M179 Osteoarthritis of knee, unspecified: Secondary | ICD-10-CM | POA: Diagnosis not present

## 2021-11-20 DIAGNOSIS — Z9049 Acquired absence of other specified parts of digestive tract: Secondary | ICD-10-CM | POA: Diagnosis not present

## 2021-11-20 DIAGNOSIS — M810 Age-related osteoporosis without current pathological fracture: Secondary | ICD-10-CM | POA: Diagnosis not present

## 2021-11-20 DIAGNOSIS — H269 Unspecified cataract: Secondary | ICD-10-CM | POA: Diagnosis not present

## 2021-11-20 DIAGNOSIS — E782 Mixed hyperlipidemia: Secondary | ICD-10-CM | POA: Diagnosis not present

## 2021-11-20 DIAGNOSIS — K219 Gastro-esophageal reflux disease without esophagitis: Secondary | ICD-10-CM | POA: Diagnosis not present

## 2021-11-20 DIAGNOSIS — I10 Essential (primary) hypertension: Secondary | ICD-10-CM | POA: Diagnosis not present

## 2021-11-20 DIAGNOSIS — H3530 Unspecified macular degeneration: Secondary | ICD-10-CM | POA: Diagnosis not present

## 2021-11-20 DIAGNOSIS — Z9089 Acquired absence of other organs: Secondary | ICD-10-CM | POA: Diagnosis not present

## 2021-11-20 DIAGNOSIS — E876 Hypokalemia: Secondary | ICD-10-CM | POA: Diagnosis not present

## 2021-11-21 DIAGNOSIS — D510 Vitamin B12 deficiency anemia due to intrinsic factor deficiency: Secondary | ICD-10-CM | POA: Diagnosis not present

## 2021-11-25 DIAGNOSIS — M179 Osteoarthritis of knee, unspecified: Secondary | ICD-10-CM | POA: Diagnosis not present

## 2021-11-25 DIAGNOSIS — K56600 Partial intestinal obstruction, unspecified as to cause: Secondary | ICD-10-CM | POA: Diagnosis not present

## 2021-11-25 DIAGNOSIS — I10 Essential (primary) hypertension: Secondary | ICD-10-CM | POA: Diagnosis not present

## 2021-11-25 DIAGNOSIS — M169 Osteoarthritis of hip, unspecified: Secondary | ICD-10-CM | POA: Diagnosis not present

## 2021-11-25 DIAGNOSIS — M810 Age-related osteoporosis without current pathological fracture: Secondary | ICD-10-CM | POA: Diagnosis not present

## 2021-11-25 DIAGNOSIS — F419 Anxiety disorder, unspecified: Secondary | ICD-10-CM | POA: Diagnosis not present

## 2021-11-27 DIAGNOSIS — M169 Osteoarthritis of hip, unspecified: Secondary | ICD-10-CM | POA: Diagnosis not present

## 2021-11-27 DIAGNOSIS — M810 Age-related osteoporosis without current pathological fracture: Secondary | ICD-10-CM | POA: Diagnosis not present

## 2021-11-27 DIAGNOSIS — M179 Osteoarthritis of knee, unspecified: Secondary | ICD-10-CM | POA: Diagnosis not present

## 2021-11-27 DIAGNOSIS — K56600 Partial intestinal obstruction, unspecified as to cause: Secondary | ICD-10-CM | POA: Diagnosis not present

## 2021-11-27 DIAGNOSIS — I10 Essential (primary) hypertension: Secondary | ICD-10-CM | POA: Diagnosis not present

## 2021-11-27 DIAGNOSIS — F419 Anxiety disorder, unspecified: Secondary | ICD-10-CM | POA: Diagnosis not present

## 2021-11-28 DIAGNOSIS — M179 Osteoarthritis of knee, unspecified: Secondary | ICD-10-CM | POA: Diagnosis not present

## 2021-11-28 DIAGNOSIS — K56600 Partial intestinal obstruction, unspecified as to cause: Secondary | ICD-10-CM | POA: Diagnosis not present

## 2021-12-02 DIAGNOSIS — K56600 Partial intestinal obstruction, unspecified as to cause: Secondary | ICD-10-CM | POA: Diagnosis not present

## 2021-12-02 DIAGNOSIS — I10 Essential (primary) hypertension: Secondary | ICD-10-CM | POA: Diagnosis not present

## 2021-12-02 DIAGNOSIS — M179 Osteoarthritis of knee, unspecified: Secondary | ICD-10-CM | POA: Diagnosis not present

## 2021-12-02 DIAGNOSIS — M169 Osteoarthritis of hip, unspecified: Secondary | ICD-10-CM | POA: Diagnosis not present

## 2021-12-02 DIAGNOSIS — F419 Anxiety disorder, unspecified: Secondary | ICD-10-CM | POA: Diagnosis not present

## 2021-12-02 DIAGNOSIS — M810 Age-related osteoporosis without current pathological fracture: Secondary | ICD-10-CM | POA: Diagnosis not present

## 2021-12-04 DIAGNOSIS — F419 Anxiety disorder, unspecified: Secondary | ICD-10-CM | POA: Diagnosis not present

## 2021-12-04 DIAGNOSIS — I10 Essential (primary) hypertension: Secondary | ICD-10-CM | POA: Diagnosis not present

## 2021-12-04 DIAGNOSIS — M179 Osteoarthritis of knee, unspecified: Secondary | ICD-10-CM | POA: Diagnosis not present

## 2021-12-04 DIAGNOSIS — M169 Osteoarthritis of hip, unspecified: Secondary | ICD-10-CM | POA: Diagnosis not present

## 2021-12-04 DIAGNOSIS — M810 Age-related osteoporosis without current pathological fracture: Secondary | ICD-10-CM | POA: Diagnosis not present

## 2021-12-04 DIAGNOSIS — K56600 Partial intestinal obstruction, unspecified as to cause: Secondary | ICD-10-CM | POA: Diagnosis not present

## 2021-12-09 DIAGNOSIS — M179 Osteoarthritis of knee, unspecified: Secondary | ICD-10-CM | POA: Diagnosis not present

## 2021-12-09 DIAGNOSIS — I10 Essential (primary) hypertension: Secondary | ICD-10-CM | POA: Diagnosis not present

## 2021-12-09 DIAGNOSIS — M810 Age-related osteoporosis without current pathological fracture: Secondary | ICD-10-CM | POA: Diagnosis not present

## 2021-12-09 DIAGNOSIS — M169 Osteoarthritis of hip, unspecified: Secondary | ICD-10-CM | POA: Diagnosis not present

## 2021-12-09 DIAGNOSIS — F419 Anxiety disorder, unspecified: Secondary | ICD-10-CM | POA: Diagnosis not present

## 2021-12-09 DIAGNOSIS — K56600 Partial intestinal obstruction, unspecified as to cause: Secondary | ICD-10-CM | POA: Diagnosis not present

## 2021-12-15 DIAGNOSIS — I10 Essential (primary) hypertension: Secondary | ICD-10-CM | POA: Diagnosis not present

## 2021-12-15 DIAGNOSIS — F419 Anxiety disorder, unspecified: Secondary | ICD-10-CM | POA: Diagnosis not present

## 2021-12-15 DIAGNOSIS — M810 Age-related osteoporosis without current pathological fracture: Secondary | ICD-10-CM | POA: Diagnosis not present

## 2021-12-15 DIAGNOSIS — M169 Osteoarthritis of hip, unspecified: Secondary | ICD-10-CM | POA: Diagnosis not present

## 2021-12-15 DIAGNOSIS — K56600 Partial intestinal obstruction, unspecified as to cause: Secondary | ICD-10-CM | POA: Diagnosis not present

## 2021-12-15 DIAGNOSIS — M179 Osteoarthritis of knee, unspecified: Secondary | ICD-10-CM | POA: Diagnosis not present

## 2021-12-18 DIAGNOSIS — M179 Osteoarthritis of knee, unspecified: Secondary | ICD-10-CM | POA: Diagnosis not present

## 2021-12-18 DIAGNOSIS — K56600 Partial intestinal obstruction, unspecified as to cause: Secondary | ICD-10-CM | POA: Diagnosis not present

## 2021-12-20 DIAGNOSIS — H269 Unspecified cataract: Secondary | ICD-10-CM | POA: Diagnosis not present

## 2021-12-20 DIAGNOSIS — E782 Mixed hyperlipidemia: Secondary | ICD-10-CM | POA: Diagnosis not present

## 2021-12-20 DIAGNOSIS — Z90710 Acquired absence of both cervix and uterus: Secondary | ICD-10-CM | POA: Diagnosis not present

## 2021-12-20 DIAGNOSIS — M169 Osteoarthritis of hip, unspecified: Secondary | ICD-10-CM | POA: Diagnosis not present

## 2021-12-20 DIAGNOSIS — Z9089 Acquired absence of other organs: Secondary | ICD-10-CM | POA: Diagnosis not present

## 2021-12-20 DIAGNOSIS — M810 Age-related osteoporosis without current pathological fracture: Secondary | ICD-10-CM | POA: Diagnosis not present

## 2021-12-20 DIAGNOSIS — K219 Gastro-esophageal reflux disease without esophagitis: Secondary | ICD-10-CM | POA: Diagnosis not present

## 2021-12-20 DIAGNOSIS — I10 Essential (primary) hypertension: Secondary | ICD-10-CM | POA: Diagnosis not present

## 2021-12-20 DIAGNOSIS — E876 Hypokalemia: Secondary | ICD-10-CM | POA: Diagnosis not present

## 2021-12-20 DIAGNOSIS — Z9049 Acquired absence of other specified parts of digestive tract: Secondary | ICD-10-CM | POA: Diagnosis not present

## 2021-12-20 DIAGNOSIS — K56600 Partial intestinal obstruction, unspecified as to cause: Secondary | ICD-10-CM | POA: Diagnosis not present

## 2021-12-20 DIAGNOSIS — H3530 Unspecified macular degeneration: Secondary | ICD-10-CM | POA: Diagnosis not present

## 2021-12-20 DIAGNOSIS — M179 Osteoarthritis of knee, unspecified: Secondary | ICD-10-CM | POA: Diagnosis not present

## 2021-12-20 DIAGNOSIS — Z9181 History of falling: Secondary | ICD-10-CM | POA: Diagnosis not present

## 2021-12-20 DIAGNOSIS — F419 Anxiety disorder, unspecified: Secondary | ICD-10-CM | POA: Diagnosis not present

## 2021-12-23 DIAGNOSIS — I10 Essential (primary) hypertension: Secondary | ICD-10-CM | POA: Diagnosis not present

## 2021-12-23 DIAGNOSIS — M179 Osteoarthritis of knee, unspecified: Secondary | ICD-10-CM | POA: Diagnosis not present

## 2021-12-23 DIAGNOSIS — F419 Anxiety disorder, unspecified: Secondary | ICD-10-CM | POA: Diagnosis not present

## 2021-12-23 DIAGNOSIS — M810 Age-related osteoporosis without current pathological fracture: Secondary | ICD-10-CM | POA: Diagnosis not present

## 2021-12-23 DIAGNOSIS — M169 Osteoarthritis of hip, unspecified: Secondary | ICD-10-CM | POA: Diagnosis not present

## 2021-12-23 DIAGNOSIS — K56600 Partial intestinal obstruction, unspecified as to cause: Secondary | ICD-10-CM | POA: Diagnosis not present

## 2021-12-30 DIAGNOSIS — M810 Age-related osteoporosis without current pathological fracture: Secondary | ICD-10-CM | POA: Diagnosis not present

## 2021-12-30 DIAGNOSIS — F419 Anxiety disorder, unspecified: Secondary | ICD-10-CM | POA: Diagnosis not present

## 2021-12-30 DIAGNOSIS — I10 Essential (primary) hypertension: Secondary | ICD-10-CM | POA: Diagnosis not present

## 2021-12-30 DIAGNOSIS — M169 Osteoarthritis of hip, unspecified: Secondary | ICD-10-CM | POA: Diagnosis not present

## 2021-12-30 DIAGNOSIS — K56600 Partial intestinal obstruction, unspecified as to cause: Secondary | ICD-10-CM | POA: Diagnosis not present

## 2021-12-30 DIAGNOSIS — M179 Osteoarthritis of knee, unspecified: Secondary | ICD-10-CM | POA: Diagnosis not present

## 2022-01-15 DIAGNOSIS — M810 Age-related osteoporosis without current pathological fracture: Secondary | ICD-10-CM | POA: Diagnosis not present

## 2022-01-15 DIAGNOSIS — I10 Essential (primary) hypertension: Secondary | ICD-10-CM | POA: Diagnosis not present

## 2022-01-15 DIAGNOSIS — M169 Osteoarthritis of hip, unspecified: Secondary | ICD-10-CM | POA: Diagnosis not present

## 2022-01-15 DIAGNOSIS — K56600 Partial intestinal obstruction, unspecified as to cause: Secondary | ICD-10-CM | POA: Diagnosis not present

## 2022-01-15 DIAGNOSIS — M179 Osteoarthritis of knee, unspecified: Secondary | ICD-10-CM | POA: Diagnosis not present

## 2022-01-15 DIAGNOSIS — F419 Anxiety disorder, unspecified: Secondary | ICD-10-CM | POA: Diagnosis not present

## 2022-02-16 ENCOUNTER — Other Ambulatory Visit: Payer: Self-pay

## 2022-02-16 ENCOUNTER — Emergency Department (HOSPITAL_COMMUNITY): Payer: Medicare Other

## 2022-02-16 ENCOUNTER — Emergency Department (HOSPITAL_COMMUNITY)
Admission: EM | Admit: 2022-02-16 | Discharge: 2022-02-16 | Disposition: A | Discharge status: Home / Self Care | Payer: Medicare Other | Attending: Emergency Medicine | Admitting: Emergency Medicine

## 2022-02-16 DIAGNOSIS — K4090 Unilateral inguinal hernia, without obstruction or gangrene, not specified as recurrent: Secondary | ICD-10-CM | POA: Diagnosis not present

## 2022-02-16 DIAGNOSIS — K6389 Other specified diseases of intestine: Secondary | ICD-10-CM | POA: Diagnosis not present

## 2022-02-16 DIAGNOSIS — R112 Nausea with vomiting, unspecified: Secondary | ICD-10-CM | POA: Diagnosis present

## 2022-02-16 LAB — URINALYSIS, ROUTINE W REFLEX MICROSCOPIC
Bilirubin Urine: NEGATIVE
Glucose, UA: NEGATIVE mg/dL
Ketones, ur: NEGATIVE mg/dL
Nitrite: NEGATIVE
Protein, ur: NEGATIVE mg/dL
Specific Gravity, Urine: 1.036 — ABNORMAL HIGH (ref 1.005–1.030)
pH: 7 (ref 5.0–8.0)

## 2022-02-16 LAB — COMPREHENSIVE METABOLIC PANEL
ALT: 17 U/L (ref 0–44)
AST: 21 U/L (ref 15–41)
Albumin: 3.9 g/dL (ref 3.5–5.0)
Alkaline Phosphatase: 78 U/L (ref 38–126)
Anion gap: 9 (ref 5–15)
BUN: 14 mg/dL (ref 8–23)
CO2: 30 mmol/L (ref 22–32)
Calcium: 10.5 mg/dL — ABNORMAL HIGH (ref 8.9–10.3)
Chloride: 100 mmol/L (ref 98–111)
Creatinine, Ser: 0.58 mg/dL (ref 0.44–1.00)
GFR, Estimated: >60 mL/min (ref >60)
Glucose, Bld: 196 mg/dL — ABNORMAL HIGH (ref 70–99)
Potassium: 3.8 mmol/L (ref 3.5–5.1)
Sodium: 139 mmol/L (ref 135–145)
Total Bilirubin: 0.6 mg/dL (ref 0.3–1.2)
Total Protein: 7.3 g/dL (ref 6.5–8.1)

## 2022-02-16 LAB — CBC
HCT: 42.6 % (ref 36.0–46.0)
Hemoglobin: 13.9 g/dL (ref 12.0–15.0)
MCH: 31.4 pg (ref 26.0–34.0)
MCHC: 32.6 g/dL (ref 30.0–36.0)
MCV: 96.2 fL (ref 80.0–100.0)
Platelets: 327 10*3/uL (ref 150–400)
RBC: 4.43 MIL/uL (ref 3.87–5.11)
RDW: 13.2 % (ref 11.5–15.5)
WBC: 15.3 10*3/uL — ABNORMAL HIGH (ref 4.0–10.5)
nRBC: 0 % (ref 0.0–0.2)

## 2022-02-16 LAB — LIPASE, BLOOD: Lipase: 31 U/L (ref 11–51)

## 2022-02-16 MED ORDER — CEPHALEXIN 500 MG PO CAPS: 500.0000 mg | ORAL_CAPSULE | Freq: Two times a day (BID) | ORAL | 0 refills | Status: AC

## 2022-02-16 MED ORDER — CEPHALEXIN 250 MG PO CAPS
500.0000 mg | ORAL_CAPSULE | Freq: Once | ORAL | Status: AC
  Administered 2022-02-16: 500 mg via ORAL
  Filled 2022-02-16: qty 2

## 2022-02-16 MED ORDER — IOHEXOL 350 MG/ML SOLN
75.0000 mL | Freq: Once | INTRAVENOUS | Status: AC | PRN
  Administered 2022-02-16: 75 mL via INTRAVENOUS

## 2022-02-16 NOTE — ED Provider Notes (Signed)
Clinical Course as of 02/16/22 1512  Mon Feb 16, 2022  1503 Stable 47 YOF with a chief complaint of abdominal pain. She has a history of hernia +obstruction. Representing for similar. CT pending. WBC elevated. [CC]    Clinical Course User Index [CC] Tretha Sciara, MD   I was called to bedside as patient is requesting discharge.  She says that she is having he would like to go home and eat dinner.  She is ambulatory tolerating p.o. intake with her son at bedside who feels that she might have a urinary tract infection due to urinary frequency.  Will initiate on Keflex 500 mg twice daily.  Patient denies fevers chills, nausea vomiting, syncope shortness of breath able to be followed up in the outpatient setting.  Patient discharged with no further acute events.   Tretha Sciara, MD 02/16/22 [phone]

## 2022-02-16 NOTE — ED Triage Notes (Signed)
Son stated, shes had a hernia somewhere but has had N/V  for the last 3 days. And sometimes she has high BP

## 2022-02-16 NOTE — ED Provider Notes (Signed)
[facility] EMERGENCY DEPARTMENT Provider Note   CSN: [id number] Arrival date & time: 02/16/22  8341     History  Chief Complaint  Patient presents with   Abdominal Pain   Emesis   Nausea    Jodi Jefferson is a 88 y.o. female.   Abdominal Pain Associated symptoms: vomiting   Emesis Associated symptoms: abdominal pain      Patient presented to the ER for evaluation nausea vomiting for the last few days.  Patient states whenever she tries to eat she starts getting nauseated and will have to vomit.  She did not want to try and eat anything this morning because she was concerned she was going to have to vomit again.  Patient has been having pain throughout her abdomen since this started but right now she is not having any pain.  She also has had decreased balance and her son tried giving her some MiraLAX.  Patient does have history of hernia in the past.  They did not know where her hernia was.  Previously surgery was recommended but the patient was not interested in that at her age.  Home Medications Prior to Admission medications   Medication Sig Start Date End Date Taking? Authorizing Provider  amoxicillin (AMOXIL) 500 MG capsule Take 2,000 mg by mouth See admin instructions. Take 4 capsules (2000 mg) by mouth one hour prior to dental appointments 08/04/21  Yes [provider]  bisacodyl (DULCOLAX) 10 MG suppository Place 1 suppository (10 mg total) rectally daily as needed for moderate constipation. 10/20/21  Yes Geradine Girt, DO  Calcium & Magnesium Carbonates (MYLANTA PO) Take 10 mLs by mouth daily as needed (constipation).   Yes [provider]  calcium carbonate (TUMS EX) 750 MG chewable tablet Chew 750 mg by mouth daily as needed for heartburn.   Yes [provider]  carboxymethylcellulose (REFRESH PLUS) 0.5 % SOLN Place 1 drop into both eyes 4 (four) times daily as needed (dry eyes).   Yes [provider]   cyanocobalamin (VITAMIN B12) 1000 MCG tablet Take 1,000 mcg by mouth daily.   Yes [provider]  denosumab (PROLIA) 60 MG/ML SOSY injection Inject 60 mg into the skin every 6 (six) months.   Yes [provider]  Famotidine (PEPCID PO) Take 1 tablet by mouth daily as needed (acid reflux).   Yes [provider]  lisinopril (ZESTRIL) 10 MG tablet Take 10 mg by mouth every evening.   Yes [provider]  Magnesium 250 MG TABS Take 250 mg by mouth daily.   Yes [provider]  Multiple Vitamins-Minerals (CENTRUM SILVER PO) Take 1 tablet by mouth daily.   Yes [provider]  Multiple Vitamins-Minerals (ZINC PO) Take 1 tablet by mouth daily.   Yes [provider]  Omega-3 1000 MG CAPS Take 2,000 mg by mouth daily.   Yes [provider]  polyethylene glycol (MIRALAX / GLYCOLAX) 17 g packet Take 17 g by mouth 2 (two) times daily.   Yes [provider]  bisacodyl (DULCOLAX) 5 MG EC tablet Take 2 tablets (10 mg total) by mouth daily as needed for moderate constipation or mild constipation. Patient not taking: Reported on 02/16/2022 10/20/21   Geradine Girt, DO  senna (SENOKOT) 8.6 MG TABS tablet Take 2 tablets (17.2 mg total) by mouth daily. Patient not taking: Reported on 02/16/2022 10/20/21   Geradine Girt, DO      Allergies    Aspirin, Macrobid [  nitrofurantoin macrocrystal], Motrin [ibuprofen], Polyethylene glycol, Prednisone, Red yeast rice [cholestin], Sulfa antibiotics, and Codeine    Review of Systems   Review of Systems  Gastrointestinal:  Positive for abdominal pain and vomiting.    Physical Exam Updated Vital Signs BP 133/76   Pulse 62   Temp (!) 97.5 F (36.4 C) (Oral)   Resp 18   Ht 1.473 m ('4\' 10"'$ )   Wt 44 kg   SpO2 98%   BMI 20.27 kg/m  Physical Exam Vitals and nursing note reviewed.  Constitutional:      General: She is not in acute distress.    Appearance: She is well-developed.  HENT:      Head: Normocephalic and atraumatic.     Right Ear: External ear normal.     Left Ear: External ear normal.  Eyes:     General: No scleral icterus.       Right eye: No discharge.        Left eye: No discharge.     Conjunctiva/sclera: Conjunctivae normal.  Neck:     Trachea: No tracheal deviation.  Cardiovascular:     Rate and Rhythm: Normal rate and regular rhythm.  Pulmonary:     Effort: Pulmonary effort is normal. No respiratory distress.     Breath sounds: Normal breath sounds. No stridor. No wheezing or rales.  Abdominal:     General: Bowel sounds are normal. There is no distension.     Palpations: Abdomen is soft.     Tenderness: There is no abdominal tenderness. There is no guarding or rebound.     Hernia: A hernia is present. Hernia is present in the right inguinal area.  Musculoskeletal:        General: No tenderness or deformity.     Cervical back: Neck supple.  Skin:    General: Skin is warm and dry.     Findings: No rash.  Neurological:     General: No focal deficit present.     Mental Status: She is alert.     Cranial Nerves: No cranial nerve deficit (no facial droop, extraocular movements intact, no slurred speech).     Sensory: No sensory deficit.     Motor: No abnormal muscle tone or seizure activity.     Coordination: Coordination normal.  Psychiatric:        Mood and Affect: Mood normal.     ED Results / Procedures / Treatments   Labs (all labs ordered are listed, but only abnormal results are displayed) Labs Reviewed  COMPREHENSIVE METABOLIC PANEL - Abnormal; Notable for the following components:      Result Value   Glucose, Bld 196 (*)    Calcium 10.5 (*)    All other components within normal limits  CBC - Abnormal; Notable for the following components:   WBC 15.3 (*)    All other components within normal limits  LIPASE, BLOOD  URINALYSIS, ROUTINE W REFLEX MICROSCOPIC    EKG None  Radiology No results found.  Procedures Hernia  reduction  Date/Time: 02/16/2022 2:20 PM  Performed by: Dorie Rank, MD Authorized by: Dorie Rank, MD  Consent: Verbal consent obtained. Local anesthesia used: no  Anesthesia: Local anesthesia used: no  Sedation: Patient sedated: no  Comments: Gentle pressure applied to right inguinal hernia.  Approximately racket ball size swelling noted.  Able to slowly reduce the hernia.       Medications Ordered in ED Medications - No data to display  ED Course/ Medical Decision  Making/ A&P Clinical Course as of 02/16/22 1532  Mon Feb 16, 2022  1503 Stable 72 YOF with a chief complaint of abdominal pain. She has a history of hernia +obstruction. Representing for similar. CT pending. WBC elevated. [CC]    Clinical Course User Index [CC] Tretha Sciara, MD                           Medical Decision Making Differential diagnosis includes but not limited to recurrent bowel obstruction, colitis diverticulitis on my physical exam patient noted to have a right inguinal hernia.  Concerned about recurrent bowel obstruction associated with the hernia however I do feel like I have been able to reduce to sit at the bedside at this time  Amount and/or Complexity of Data Reviewed External Data Reviewed: radiology and notes.    Details: Prior CT scan reviewed in June of this year.  Patient was noted to have a partial small bowel obstruction secondary to right inguinal hernia Labs: ordered. Radiology: ordered.   Pt with abd pain vomiting several days.  History of bowel obstruction, inguinal hernia.  Hernia reduced in the ED.  Will ct to rule out recurrent bowel obstruction.  Case turned over to Dr Oswald Hillock at shift change.        Final Clinical Impression(s) / ED Diagnoses Final diagnoses:  Right inguinal hernia    Rx / DC Orders ED Discharge Orders     None         Dorie Rank, MD 02/16/22 [phone]

## 2022-02-16 NOTE — Discharge Instructions (Addendum)
You were seen today for abdominal pain, you have bilateral hernias without evidence of obstruction at this time.  You have a urinary tract infection that we will start you on Keflex since 5 days 4.  We have discussed ongoing care management.  You could be evaluated by surgery for this but you have stated you would not want surgical intervention.  Given these findings and improvement of your symptoms, I believe you are stable for outpatient care and management.  Please follow-up with your primary care provider and surgeon in the outpatient setting and return if you have any decompensation, worsening pain or nausea and vomiting.  Thank for the opportunity participate in your care, Tretha Sciara MD

## 2022-02-25 DIAGNOSIS — M1712 Unilateral primary osteoarthritis, left knee: Secondary | ICD-10-CM | POA: Diagnosis not present

## 2022-02-25 DIAGNOSIS — M25562 Pain in left knee: Secondary | ICD-10-CM | POA: Diagnosis not present

## 2022-05-28 DIAGNOSIS — M1712 Unilateral primary osteoarthritis, left knee: Secondary | ICD-10-CM | POA: Diagnosis not present

## 2022-08-26 DIAGNOSIS — M1712 Unilateral primary osteoarthritis, left knee: Secondary | ICD-10-CM | POA: Diagnosis not present

## 2022-09-28 DIAGNOSIS — K4190 Unilateral femoral hernia, without obstruction or gangrene, not specified as recurrent: Secondary | ICD-10-CM | POA: Diagnosis not present

## 2022-10-29 DIAGNOSIS — Z0000 Encounter for general adult medical examination without abnormal findings: Secondary | ICD-10-CM | POA: Diagnosis not present

## 2022-10-29 DIAGNOSIS — I10 Essential (primary) hypertension: Secondary | ICD-10-CM | POA: Diagnosis not present

## 2022-10-29 DIAGNOSIS — I209 Angina pectoris, unspecified: Secondary | ICD-10-CM | POA: Diagnosis not present

## 2022-10-29 DIAGNOSIS — Z79899 Other long term (current) drug therapy: Secondary | ICD-10-CM | POA: Diagnosis not present

## 2022-10-29 DIAGNOSIS — Z780 Asymptomatic menopausal state: Secondary | ICD-10-CM | POA: Diagnosis not present

## 2022-10-29 DIAGNOSIS — R7302 Impaired glucose tolerance (oral): Secondary | ICD-10-CM | POA: Diagnosis not present

## 2022-10-29 DIAGNOSIS — K219 Gastro-esophageal reflux disease without esophagitis: Secondary | ICD-10-CM | POA: Diagnosis not present

## 2022-10-29 DIAGNOSIS — D510 Vitamin B12 deficiency anemia due to intrinsic factor deficiency: Secondary | ICD-10-CM | POA: Diagnosis not present

## 2022-11-30 ENCOUNTER — Telehealth: Payer: Self-pay

## 2022-11-30 NOTE — Patient Outreach (Signed)
  Care Coordination   11/30/2022 Name: Jodi Jefferson MRN: [id number] DOB: 30-May-1931   Care Coordination Outreach Attempts:  An unsuccessful telephone outreach was attempted today to offer the patient information about available care coordination services.  Follow Up Plan:  Additional outreach attempts will be made to offer the patient care coordination information and services.   Encounter Outcome:  No Answer   Care Coordination Interventions:  No, not indicated     Rowe Pavy, RN, BSN, [facility] [employer] [phone]

## 2022-12-01 ENCOUNTER — Telehealth: Payer: Self-pay

## 2022-12-01 NOTE — Patient Outreach (Signed)
  Care Coordination   12/01/2022 Name: Jodi Jefferson MRN: [id number] DOB: 1931-10-25   Care Coordination Outreach Attempts:  A second unsuccessful outreach was attempted today to offer the patient with information about available care coordination services.  Follow Up Plan:  Additional outreach attempts will be made to offer the patient care coordination information and services.   Encounter Outcome:  No Answer   Care Coordination Interventions:  No, not indicated    Rowe Pavy, RN, BSN, [facility] [employer] [phone]

## 2022-12-02 DIAGNOSIS — M1712 Unilateral primary osteoarthritis, left knee: Secondary | ICD-10-CM | POA: Diagnosis not present

## 2022-12-03 ENCOUNTER — Telehealth: Payer: Self-pay

## 2022-12-03 NOTE — Patient Outreach (Signed)
  Care Coordination   12/03/2022 Name: Jodi Jefferson MRN: [id number] DOB: 12-08-1931   Care Coordination Outreach Attempts:  A third unsuccessful outreach was attempted today to offer the patient with information about available care coordination services.  Follow Up Plan:  No further outreach attempts will be made at this time. We have been unable to contact the patient to offer or enroll patient in care coordination services  Encounter Outcome:  No Answer   Care Coordination Interventions:  No, not indicated    Rowe Pavy, RN, BSN, [facility] Coordinator [phone]

## 2023-01-28 DIAGNOSIS — Z23 Encounter for immunization: Secondary | ICD-10-CM | POA: Diagnosis not present

## 2023-05-01 ENCOUNTER — Other Ambulatory Visit: Payer: Self-pay

## 2023-05-01 ENCOUNTER — Emergency Department (HOSPITAL_COMMUNITY): Payer: Medicare Other

## 2023-05-01 ENCOUNTER — Encounter (HOSPITAL_COMMUNITY): Payer: Self-pay | Admitting: *Deleted

## 2023-05-01 ENCOUNTER — Emergency Department (HOSPITAL_COMMUNITY)
Admission: EM | Admit: 2023-05-01 | Discharge: 2023-05-01 | Disposition: A | Discharge status: Home / Self Care | Payer: Medicare Other

## 2023-05-01 DIAGNOSIS — D72829 Elevated white blood cell count, unspecified: Secondary | ICD-10-CM | POA: Insufficient documentation

## 2023-05-01 DIAGNOSIS — S3210XA Unspecified fracture of sacrum, initial encounter for closed fracture: Secondary | ICD-10-CM | POA: Diagnosis not present

## 2023-05-01 DIAGNOSIS — M25552 Pain in left hip: Secondary | ICD-10-CM | POA: Diagnosis present

## 2023-05-01 DIAGNOSIS — X58XXXA Exposure to other specified factors, initial encounter: Secondary | ICD-10-CM | POA: Insufficient documentation

## 2023-05-01 LAB — CBC
HCT: 42.8 % (ref 36.0–46.0)
Hemoglobin: 14.1 g/dL (ref 12.0–15.0)
MCH: 31.5 pg (ref 26.0–34.0)
MCHC: 32.9 g/dL (ref 30.0–36.0)
MCV: 95.5 fL (ref 80.0–100.0)
Platelets: 303 10*3/uL (ref 150–400)
RBC: 4.48 MIL/uL (ref 3.87–5.11)
RDW: 12.9 % (ref 11.5–15.5)
WBC: 14.9 10*3/uL — ABNORMAL HIGH (ref 4.0–10.5)
nRBC: 0 % (ref 0.0–0.2)

## 2023-05-01 LAB — COMPREHENSIVE METABOLIC PANEL
ALT: 21 U/L (ref 0–44)
AST: 24 U/L (ref 15–41)
Albumin: 3.6 g/dL (ref 3.5–5.0)
Alkaline Phosphatase: 97 U/L (ref 38–126)
Anion gap: 11 (ref 5–15)
BUN: 14 mg/dL (ref 8–23)
CO2: 25 mmol/L (ref 22–32)
Calcium: 9.9 mg/dL (ref 8.9–10.3)
Chloride: 101 mmol/L (ref 98–111)
Creatinine, Ser: 0.55 mg/dL (ref 0.44–1.00)
GFR, Estimated: >60 mL/min (ref >60)
Glucose, Bld: 177 mg/dL — ABNORMAL HIGH (ref 70–99)
Potassium: 3.2 mmol/L — ABNORMAL LOW (ref 3.5–5.1)
Sodium: 137 mmol/L (ref 135–145)
Total Bilirubin: 0.8 mg/dL (ref 0.0–1.2)
Total Protein: 7.4 g/dL (ref 6.5–8.1)

## 2023-05-01 LAB — LIPASE, BLOOD: Lipase: 26 U/L (ref 11–51)

## 2023-05-01 MED ORDER — LIDOCAINE 4 % EX PTCH: 1.0000 | MEDICATED_PATCH | CUTANEOUS | 0 refills | Status: AC

## 2023-05-01 MED ORDER — LIDOCAINE 5 % EX PTCH
1.0000 | MEDICATED_PATCH | Freq: Once | CUTANEOUS | Status: DC
  Administered 2023-05-01: 1 via TRANSDERMAL
  Filled 2023-05-01: qty 1

## 2023-05-01 MED ORDER — ACETAMINOPHEN 500 MG PO TABS
1000.0000 mg | ORAL_TABLET | Freq: Once | ORAL | Status: AC
  Administered 2023-05-01: 1000 mg via ORAL
  Filled 2023-05-01: qty 2

## 2023-05-01 NOTE — ED Triage Notes (Signed)
 The pt is c/o lt hip pain for 2 weeks no known injury  her son is with her and he reports that the pt has not fallen  and he thinks that her lt knee is painful also  he wants everything checked out

## 2023-05-01 NOTE — ED Notes (Signed)
 Pt d/c home per EDP order. Discharge summary reviewed, verbalize understanding. Off unit via WC- NAD.

## 2023-05-01 NOTE — Discharge Instructions (Addendum)
 May take over-the-counter Tylenol as directed on the packaging every 8 hours as well as lidocaine patch.  Please follow-up with the orthopedic doctor and your primary doctor.

## 2023-05-01 NOTE — ED Provider Notes (Signed)
 [facility] EMERGENCY DEPARTMENT AT [facility] Provider Note   CSN: [id number] Arrival date & time: 05/01/23  1707     History  Chief Complaint  Patient presents with   Hip Pain    Jodi Jefferson is a 89 y.o. female.  Is a 89 year old female present emergency department for hip pain.  Reportedly has history of prior pelvic fractures.  Reportedly increasing pain over the past 2 weeks.  Primarily left-sided.  Pain seemingly worsened for the past 4 days.  No numbness tingling changes in sensation.  No trauma.  No fevers chills.   Hip Pain       Home Medications Prior to Admission medications   Medication Sig Start Date End Date Taking? Authorizing Provider  lidocaine  (HM LIDOCAINE  PATCH) 4 % Place 1 patch onto the skin daily for 15 days. 05/01/23 05/16/23 Yes Loreda Silverio, Caron PARAS, DO  amoxicillin (AMOXIL) 500 MG capsule Take 2,000 mg by mouth See admin instructions. Take 4 capsules (2000 mg) by mouth one hour prior to dental appointments 08/04/21   [provider]  bisacodyl  (DULCOLAX) 10 MG suppository Place 1 suppository (10 mg total) rectally daily as needed for moderate constipation. 10/20/21   Vann, Jessica U, DO  bisacodyl  (DULCOLAX) 5 MG EC tablet Take 2 tablets (10 mg total) by mouth daily as needed for moderate constipation or mild constipation. Patient not taking: Reported on 02/16/2022 10/20/21   Vann, Jessica U, DO  Calcium & Magnesium  Carbonates (MYLANTA PO) Take 10 mLs by mouth daily as needed (constipation).    [provider]  calcium carbonate (TUMS EX) 750 MG chewable tablet Chew 750 mg by mouth daily as needed for heartburn.    [provider]  carboxymethylcellulose (REFRESH PLUS) 0.5 % SOLN Place 1 drop into both eyes 4 (four) times daily as needed (dry eyes).    [provider]  cyanocobalamin (VITAMIN B12) 1000 MCG tablet Take 1,000 mcg by mouth daily.    [provider]  denosumab  (PROLIA ) 60 MG/ML SOSY  injection Inject 60 mg into the skin every 6 (six) months.    [provider]  Famotidine  (PEPCID  PO) Take 1 tablet by mouth daily as needed (acid reflux).    [provider]  lisinopril  (ZESTRIL ) 10 MG tablet Take 10 mg by mouth every evening.    [provider]  Magnesium  250 MG TABS Take 250 mg by mouth daily.    [provider]  Multiple Vitamins-Minerals (CENTRUM SILVER PO) Take 1 tablet by mouth daily.    [provider]  Multiple Vitamins-Minerals (ZINC PO) Take 1 tablet by mouth daily.    [provider]  Omega-3 1000 MG CAPS Take 2,000 mg by mouth daily.    [provider]  polyethylene glycol (MIRALAX / GLYCOLAX) 17 g packet Take 17 g by mouth 2 (two) times daily.    [provider]  senna (SENOKOT) 8.6 MG TABS tablet Take 2 tablets (17.2 mg total) by mouth daily. Patient not taking: Reported on 02/16/2022 10/20/21   Vann, Jessica U, DO      Allergies    Aspirin, Macrobid [nitrofurantoin macrocrystal], Motrin [ibuprofen], Polyethylene glycol, Prednisone, Red yeast rice [cholestin], Sulfa antibiotics, and Codeine    Review of Systems   Review of Systems  Physical Exam Updated Vital Signs BP 136/63   Pulse 61   Temp 98.4 F (36.9 C)   Resp 18   Ht 4' 10 (1.473 m)   Wt 44 kg  SpO2 93%   BMI 20.27 kg/m  Physical Exam Vitals and nursing note reviewed.  Constitutional:      General: She is not in acute distress.    Appearance: She is not toxic-appearing.  HENT:     Head: Normocephalic.     Nose: Nose normal.  Eyes:     Conjunctiva/sclera: Conjunctivae normal.  Cardiovascular:     Rate and Rhythm: Normal rate.  Abdominal:     General: Abdomen is flat.  Musculoskeletal:     Comments: Some tenderness around the left hip joint.  5 out of 5 plantarflexion dorsiflexion.  Soft compartments.  Normal sensation in lower extremities.  Skin:    General: Skin is warm.  Neurological:     Mental Status: She  is alert and oriented to person, place, and time.  Psychiatric:        Mood and Affect: Mood normal.        Behavior: Behavior normal.     ED Results / Procedures / Treatments   Labs (all labs ordered are listed, but only abnormal results are displayed) Labs Reviewed  COMPREHENSIVE METABOLIC PANEL - Abnormal; Notable for the following components:      Result Value   Potassium 3.2 (*)    Glucose, Bld 177 (*)    All other components within normal limits  CBC - Abnormal; Notable for the following components:   WBC 14.9 (*)    All other components within normal limits  LIPASE, BLOOD    EKG None  Radiology CT PELVIS WO CONTRAST Result Date: 05/01/2023 CLINICAL DATA:  Left hip pain EXAM: CT PELVIS WITHOUT CONTRAST TECHNIQUE: Multidetector CT imaging of the pelvis was performed following the standard protocol without intravenous contrast. RADIATION DOSE REDUCTION: This exam was performed according to the departmental dose-optimization program which includes automated exposure control, adjustment of the mA and/or kV according to patient size and/or use of iterative reconstruction technique. COMPARISON:  Pelvic radiograph dated 05/01/2023. CT abdomen/pelvis dated 02/16/2022. FINDINGS: Urinary Tract:  Bladder is within normal limits. Bowel:  Visualized bowel is unremarkable. Vascular/Lymphatic: No evidence of abdominal aortic aneurysm. Atherosclerotic calcifications of the abdominal aorta and branch vessels. No suspicious pelvic lymphadenopathy. Reproductive:  Status post hysterectomy.  No adnexal masses. Other:  No pelvic ascites. Musculoskeletal: Nondisplaced fracture involving the left transverse process at L5 (series 5/image 4). Nondisplaced bilateral sacral ala fractures (series 5/image 12). Severe degenerative changes of the left hip with acetabular protrusio, chronic. Mild degenerative changes of the right hip. Old/healed bilateral inferior pubic rami fractures (series 5/image 83).  IMPRESSION: Nondisplaced left transverse process fracture at L5. Nondisplaced bilateral sacral ala fractures. Old/healed bilateral inferior pubic rami fractures. Severe degenerative changes of the left hip with acetabular protrusio, chronic. Electronically Signed   By: Pinkie Pebbles M.D.   On: 05/01/2023 20:29   DG Pelvis 1-2 Views Result Date: 05/01/2023 CLINICAL DATA:  Left hip pain.  No known injury. EXAM: PELVIS - 1-2 VIEW COMPARISON:  Reformats from abdominopelvic CT 02/16/2022, pelvis radiograph 09/26/2015 FINDINGS: Advanced chronic left hip arthropathy. There is chronic acetabular protrusio, joint space narrowing and flattening of the femoral head. Femoral and acetabular osteophytes. No evidence of acute fracture. Moderate right hip osteoarthritis with joint space narrowing and spurring. No pubic symphyseal or sacroiliac diastasis. The bones are subjectively under mineralized. IMPRESSION: 1. Advanced chronic left hip arthropathy with acetabular protrusio. Progression since 2017, but grossly stable from 2023. 2. Moderate right hip osteoarthritis. Electronically Signed   By: Andrea Marlee HERO.D.  On: 05/01/2023 19:27    Procedures Procedures    Medications Ordered in ED Medications  lidocaine  (LIDODERM ) 5 % 1 patch (1 patch Transdermal Patch Applied 05/01/23 1936)  acetaminophen  (TYLENOL ) tablet 1,000 mg (1,000 mg Oral Given 05/01/23 1936)    ED Course/ Medical Decision Making/ A&P Clinical Course as of 05/01/23 2122  Sat May 01, 2023  1932 DG Pelvis 1-2 Views [TY]  1932 DG Pelvis 1-2 Views IMPRESSION: 1. Advanced chronic left hip arthropathy with acetabular protrusio. Progression since 2017, but grossly stable from 2023. 2. Moderate right hip osteoarthritis.   Electronically Signed   [TY]  2045 CT PELVIS WO CONTRAST Spoke with Dr. Sherida reviewed images hard to say whether sacral alar fractures are new course acute based on CT scan. Conservative treatment. Pain control and  weight bear as tolerated [TY]  2052 Patient reevaluated is having some improvement with Tylenol  and lidocaine .  Offered admission, but states that she would like to go home as she is feeling better.  Informed of CT findings.  [facility].  Will give lidocaine  patches.  Will discharge at patient's request. [TY]    Clinical Course User Index [TY] Neysa Caron PARAS, DO                                 Medical Decision Making Is an 89 year old female presenting emergency department with left hip pain.  Atraumatic.  Vital signs reassuring.  Physical exam with some tenderness on palpation, but no obvious deformities.  Good pulses and good neurovascular exam.  Triage did order basic labs no significant metabolic derangements.  Normal kidney function.  Lipase normal.  Patent keratitis unlikely.  Does have mild leukocytosis.  Nonspecific.  X-ray pelvis with chronic findings.  Given patient's degree of pain concern for occult fracture.  CT scan with possible sacral ala fractures along with transverse process fracture of L5.  Not having tenderness to the L5/low back.  Case discussed with Ortho; see ED course.  Offered admission, patient declined.  Will discharge with supportive care.    Amount and/or Complexity of Data Reviewed Radiology: ordered. Decision-making details documented in ED Course.  Risk OTC drugs. Prescription drug management.         Final Clinical Impression(s) / ED Diagnoses Final diagnoses:  Closed fracture of sacrum, unspecified fracture morphology, initial encounter [facility])    Rx / DC Orders ED Discharge Orders          Ordered    lidocaine  (HM LIDOCAINE  PATCH) 4 %  Every 24 hours        05/01/23 2052              Neysa Caron PARAS, DO 05/01/23 2122

## 2023-05-08 ENCOUNTER — Encounter (HOSPITAL_COMMUNITY): Payer: Self-pay

## 2023-05-08 ENCOUNTER — Emergency Department (HOSPITAL_COMMUNITY): Payer: Medicare Other

## 2023-05-08 ENCOUNTER — Emergency Department (HOSPITAL_COMMUNITY)
Admission: EM | Admit: 2023-05-08 | Discharge: 2023-05-08 | Disposition: A | Discharge status: Home / Self Care | Payer: Medicare Other | Attending: Emergency Medicine | Admitting: Emergency Medicine

## 2023-05-08 DIAGNOSIS — Z1152 Encounter for screening for COVID-19: Secondary | ICD-10-CM | POA: Insufficient documentation

## 2023-05-08 DIAGNOSIS — R531 Weakness: Secondary | ICD-10-CM | POA: Diagnosis not present

## 2023-05-08 DIAGNOSIS — M25562 Pain in left knee: Secondary | ICD-10-CM | POA: Insufficient documentation

## 2023-05-08 DIAGNOSIS — M25552 Pain in left hip: Secondary | ICD-10-CM | POA: Diagnosis present

## 2023-05-08 DIAGNOSIS — R2681 Unsteadiness on feet: Secondary | ICD-10-CM | POA: Insufficient documentation

## 2023-05-08 LAB — CBC WITH DIFFERENTIAL/PLATELET
Abs Immature Granulocytes: 0.08 10*3/uL — ABNORMAL HIGH (ref 0.00–0.07)
Basophils Absolute: 0 10*3/uL (ref 0.0–0.1)
Basophils Relative: 0 %
Eosinophils Absolute: 0 10*3/uL (ref 0.0–0.5)
Eosinophils Relative: 0 %
HCT: 38.4 % (ref 36.0–46.0)
Hemoglobin: 12.4 g/dL (ref 12.0–15.0)
Immature Granulocytes: 1 %
Lymphocytes Relative: 8 %
Lymphs Abs: 0.9 10*3/uL (ref 0.7–4.0)
MCH: 31.2 pg (ref 26.0–34.0)
MCHC: 32.3 g/dL (ref 30.0–36.0)
MCV: 96.5 fL (ref 80.0–100.0)
Monocytes Absolute: 0.9 10*3/uL (ref 0.1–1.0)
Monocytes Relative: 7 %
Neutro Abs: 10.2 10*3/uL — ABNORMAL HIGH (ref 1.7–7.7)
Neutrophils Relative %: 84 %
Platelets: 298 10*3/uL (ref 150–400)
RBC: 3.98 MIL/uL (ref 3.87–5.11)
RDW: 13.2 % (ref 11.5–15.5)
WBC: 12.1 10*3/uL — ABNORMAL HIGH (ref 4.0–10.5)
nRBC: 0 % (ref 0.0–0.2)

## 2023-05-08 LAB — URINALYSIS, ROUTINE W REFLEX MICROSCOPIC
Bilirubin Urine: NEGATIVE
Glucose, UA: NEGATIVE mg/dL
Hgb urine dipstick: NEGATIVE
Ketones, ur: NEGATIVE mg/dL
Leukocytes,Ua: NEGATIVE
Nitrite: NEGATIVE
Protein, ur: 30 mg/dL — AB
Specific Gravity, Urine: 1.023 (ref 1.005–1.030)
pH: 6 (ref 5.0–8.0)

## 2023-05-08 LAB — MAGNESIUM: Magnesium: 2.1 mg/dL (ref 1.7–2.4)

## 2023-05-08 LAB — LIPASE, BLOOD: Lipase: 34 U/L (ref 11–51)

## 2023-05-08 LAB — COMPREHENSIVE METABOLIC PANEL
ALT: 23 U/L (ref 0–44)
AST: 24 U/L (ref 15–41)
Albumin: 2.8 g/dL — ABNORMAL LOW (ref 3.5–5.0)
Alkaline Phosphatase: 104 U/L (ref 38–126)
Anion gap: 8 (ref 5–15)
BUN: 22 mg/dL (ref 8–23)
CO2: 22 mmol/L (ref 22–32)
Calcium: 8.5 mg/dL — ABNORMAL LOW (ref 8.9–10.3)
Chloride: 109 mmol/L (ref 98–111)
Creatinine, Ser: 0.48 mg/dL (ref 0.44–1.00)
GFR, Estimated: >60 mL/min (ref >60)
Glucose, Bld: 110 mg/dL — ABNORMAL HIGH (ref 70–99)
Potassium: 3.8 mmol/L (ref 3.5–5.1)
Sodium: 139 mmol/L (ref 135–145)
Total Bilirubin: 0.8 mg/dL (ref 0.0–1.2)
Total Protein: 5.7 g/dL — ABNORMAL LOW (ref 6.5–8.1)

## 2023-05-08 LAB — RESP PANEL BY RT-PCR (RSV, FLU A&B, COVID)  RVPGX2
Influenza A by PCR: NEGATIVE
Influenza B by PCR: NEGATIVE
Resp Syncytial Virus by PCR: NEGATIVE
SARS Coronavirus 2 by RT PCR: NEGATIVE

## 2023-05-08 LAB — TROPONIN I (HIGH SENSITIVITY)
Troponin I (High Sensitivity): 20 ng/L — ABNORMAL HIGH (ref <18)
Troponin I (High Sensitivity): 20 ng/L — ABNORMAL HIGH (ref <18)

## 2023-05-08 MED ORDER — SODIUM CHLORIDE 0.9 % IV BOLUS
1000.0000 mL | Freq: Once | INTRAVENOUS | Status: AC
Start: 2023-05-08 — End: 2023-05-08
  Administered 2023-05-08: 1000 mL via INTRAVENOUS

## 2023-05-08 NOTE — Discharge Instructions (Signed)
You were seen in the emergency department for left-sided hip and knee pain along with being generally weak.  Your lab work did not show any significant abnormalities and there was no sign of a urinary tract infection.  Your x-ray showed arthritis.  I have put in a referral for home health services and they should be contacting you early this week to set up an evaluation.  Please also keep your primary care doctor in the loop as far as how she is doing.  Return if any worsening or concerning symptoms

## 2023-05-08 NOTE — ED Triage Notes (Addendum)
Pt arrives to the er via ems for the c/o knee pain and possible uti. Per son she has been a little more confused than normal. Strong urine smell present on arrival. VSS per ems. Pt a and o x 3.

## 2023-05-08 NOTE — ED Provider Notes (Signed)
[name] AT [facility] Provider Note   CSN: [id number] Arrival date & time: 05/08/23  1620     History {Add pertinent medical, surgical, social history, OB history to HPI:1} Chief Complaint  Patient presents with   Knee Pain   possible uti    Jodi Jefferson is a 89 y.o. female.  She lives with her son who is providing most of the history.  She probably has some memory issues at baseline.  He said she has had ongoing pain in her left hip and knee for months.  Causes her pain at night cries out a lot.  She is gone from ambulating with a walker to not really ambulating.  He is also noticed that she feels more weak and is not eating much.  He is concerned she may need to be admitted to the hospital or go somewhere where her strength can be built up.  She herself denies any complaints.  No chest pain or shortness of breath no cough vomiting or diarrhea.  He said she is often constipated and has a hernia that gives her troubles at times.  Intermittent confusion.  The history is provided by the patient.  Knee Pain Location:  Hip and knee Injury: no   Hip location:  L hip Knee location:  L knee Pain details:    Quality:  Unable to specify Chronicity:  Chronic Associated symptoms: no fever        Home Medications Prior to Admission medications   Medication Sig Start Date End Date Taking? Authorizing Provider  amoxicillin (AMOXIL) 500 MG capsule Take 2,000 mg by mouth See admin instructions. Take 4 capsules (2000 mg) by mouth one hour prior to dental appointments 08/04/21   [provider]  bisacodyl (DULCOLAX) 10 MG suppository Place 1 suppository (10 mg total) rectally daily as needed for moderate constipation. 10/20/21   Joseph Art, DO  bisacodyl (DULCOLAX) 5 MG EC tablet Take 2 tablets (10 mg total) by mouth daily as needed for moderate constipation or mild constipation. Patient not taking: Reported on 02/16/2022 10/20/21   Joseph Art, DO  Calcium & Magnesium Carbonates (MYLANTA PO) Take 10 mLs by mouth daily as needed (constipation).    [provider]  calcium carbonate (TUMS EX) 750 MG chewable tablet Chew 750 mg by mouth daily as needed for heartburn.    [provider]  carboxymethylcellulose (REFRESH PLUS) 0.5 % SOLN Place 1 drop into both eyes 4 (four) times daily as needed (dry eyes).    [provider]  cyanocobalamin (VITAMIN B12) 1000 MCG tablet Take 1,000 mcg by mouth daily.    [provider]  denosumab (PROLIA) 60 MG/ML SOSY injection Inject 60 mg into the skin every 6 (six) months.    [provider]  Famotidine (PEPCID PO) Take 1 tablet by mouth daily as needed (acid reflux).    [provider]  lidocaine (HM LIDOCAINE PATCH) 4 % Place 1 patch onto the skin daily for 15 days. 05/01/23 05/16/23  Coral Spikes, DO  lisinopril (ZESTRIL) 10 MG tablet Take 10 mg by mouth every evening.    [provider]  Magnesium 250 MG TABS Take 250 mg by mouth daily.    [provider]  Multiple Vitamins-Minerals (CENTRUM SILVER PO) Take 1 tablet by mouth daily.    [provider]  Multiple Vitamins-Minerals (ZINC PO) Take 1 tablet by mouth daily.    [provider]  Omega-3  1000 MG CAPS Take 2,000 mg by mouth daily.    [provider]  polyethylene glycol (MIRALAX / GLYCOLAX) 17 g packet Take 17 g by mouth 2 (two) times daily.    [provider]  senna (SENOKOT) 8.6 MG TABS tablet Take 2 tablets (17.2 mg total) by mouth daily. Patient not taking: Reported on 02/16/2022 10/20/21   Joseph Art, DO      Allergies    Aspirin, Macrobid [nitrofurantoin macrocrystal], Motrin [ibuprofen], Polyethylene glycol, Prednisone, Red yeast rice [cholestin], Sulfa antibiotics, and Codeine    Review of Systems   Review of Systems  Constitutional:  Negative for fever.  Respiratory:  Negative for shortness of breath.    Cardiovascular:  Negative for chest pain.  Gastrointestinal:  Positive for constipation. Negative for nausea and vomiting.  Genitourinary:  Negative for dysuria.  Musculoskeletal:  Positive for gait problem.    Physical Exam Updated Vital Signs BP (!) 154/94 (BP Location: Right Arm)   Pulse 64   Temp 98.1 F (36.7 C) (Oral)   Resp 18   SpO2 100%  Physical Exam Vitals and nursing note reviewed.  Constitutional:      General: She is not in acute distress.    Appearance: Normal appearance. She is well-developed.  HENT:     Head: Normocephalic and atraumatic.  Eyes:     Conjunctiva/sclera: Conjunctivae normal.  Cardiovascular:     Rate and Rhythm: Normal rate and regular rhythm.     Heart sounds: No murmur heard. Pulmonary:     Effort: Pulmonary effort is normal. No respiratory distress.     Breath sounds: Normal breath sounds.  Abdominal:     Palpations: Abdomen is soft.     Tenderness: There is no abdominal tenderness. There is no guarding or rebound.  Musculoskeletal:        General: Tenderness present. No deformity.     Cervical back: Neck supple.     Comments: She has some vague pain at her left hip and left knee with range of motion.  There is no gross deformities.  Distal pulses motor and sensation intact  Skin:    General: Skin is warm and dry.     Capillary Refill: Capillary refill takes less than 2 seconds.  Neurological:     Mental Status: She is alert.     Sensory: No sensory deficit.     Motor: No weakness.     ED Results / Procedures / Treatments   Labs (all labs ordered are listed, but only abnormal results are displayed) Labs Reviewed - No data to display  EKG None  Radiology No results found.  Procedures Procedures  {Document cardiac monitor, telemetry assessment procedure when appropriate:1}  Medications Ordered in ED Medications  sodium chloride 0.9 % bolus 1,000 mL (has no administration in time range)    ED Course/ Medical Decision  Making/ A&P   {   Click here for ABCD2, HEART and other calculatorsREFRESH Note before signing :1}                              Medical Decision Making Amount and/or Complexity of Data Reviewed Labs: ordered. Radiology: ordered.   This patient complains of ***; this involves an extensive number of treatment Options and is a complaint that carries with it a high risk of complications and morbidity. The differential includes ***  I ordered, reviewed and interpreted labs, which included *** I ordered  medication *** and reviewed PMP when indicated. I ordered imaging studies which included *** and I independently    visualized and interpreted imaging which showed *** Additional history obtained from *** Previous records obtained and reviewed *** I consulted *** and discussed lab and imaging findings and discussed disposition.  Cardiac monitoring reviewed, *** Social determinants considered, *** Critical Interventions: ***  After the interventions stated above, I reevaluated the patient and found *** Admission and further testing considered, ***   {Document critical care time when appropriate:1} {Document review of labs and clinical decision tools ie heart score, Chads2Vasc2 etc:1}  {Document your independent review of radiology images, and any outside records:1} {Document your discussion with family members, caretakers, and with consultants:1} {Document social determinants of health affecting pt's care:1} {Document your decision making why or why not admission, treatments were needed:1} Final Clinical Impression(s) / ED Diagnoses Final diagnoses:  None    Rx / DC Orders ED Discharge Orders     None

## 2023-05-14 ENCOUNTER — Emergency Department (HOSPITAL_COMMUNITY): Payer: Medicare Other

## 2023-05-14 ENCOUNTER — Encounter (HOSPITAL_COMMUNITY): Payer: Self-pay

## 2023-05-14 ENCOUNTER — Emergency Department (HOSPITAL_COMMUNITY)
Admission: EM | Admit: 2023-05-14 | Discharge: 2023-05-14 | Disposition: A | Discharge status: Home / Self Care | Payer: Medicare Other | Attending: Emergency Medicine | Admitting: Emergency Medicine

## 2023-05-14 ENCOUNTER — Other Ambulatory Visit: Payer: Self-pay

## 2023-05-14 ENCOUNTER — Telehealth: Payer: Self-pay | Admitting: *Deleted

## 2023-05-14 DIAGNOSIS — R1013 Epigastric pain: Secondary | ICD-10-CM | POA: Insufficient documentation

## 2023-05-14 DIAGNOSIS — S3210XD Unspecified fracture of sacrum, subsequent encounter for fracture with routine healing: Secondary | ICD-10-CM | POA: Insufficient documentation

## 2023-05-14 DIAGNOSIS — K219 Gastro-esophageal reflux disease without esophagitis: Secondary | ICD-10-CM | POA: Diagnosis not present

## 2023-05-14 DIAGNOSIS — K5900 Constipation, unspecified: Secondary | ICD-10-CM | POA: Insufficient documentation

## 2023-05-14 DIAGNOSIS — M25552 Pain in left hip: Secondary | ICD-10-CM | POA: Diagnosis not present

## 2023-05-14 DIAGNOSIS — I10 Essential (primary) hypertension: Secondary | ICD-10-CM

## 2023-05-14 DIAGNOSIS — Z79899 Other long term (current) drug therapy: Secondary | ICD-10-CM | POA: Diagnosis not present

## 2023-05-14 DIAGNOSIS — R102 Pelvic and perineal pain: Secondary | ICD-10-CM | POA: Insufficient documentation

## 2023-05-14 DIAGNOSIS — S3992XD Unspecified injury of lower back, subsequent encounter: Secondary | ICD-10-CM | POA: Diagnosis present

## 2023-05-14 DIAGNOSIS — F419 Anxiety disorder, unspecified: Secondary | ICD-10-CM

## 2023-05-14 DIAGNOSIS — X58XXXD Exposure to other specified factors, subsequent encounter: Secondary | ICD-10-CM | POA: Diagnosis not present

## 2023-05-14 LAB — CBC
HCT: 39.4 % (ref 36.0–46.0)
Hemoglobin: 13 g/dL (ref 12.0–15.0)
MCH: 31.3 pg (ref 26.0–34.0)
MCHC: 33 g/dL (ref 30.0–36.0)
MCV: 94.9 fL (ref 80.0–100.0)
Platelets: 397 10*3/uL (ref 150–400)
RBC: 4.15 MIL/uL (ref 3.87–5.11)
RDW: 13.3 % (ref 11.5–15.5)
WBC: 17.7 10*3/uL — ABNORMAL HIGH (ref 4.0–10.5)
nRBC: 0 % (ref 0.0–0.2)

## 2023-05-14 LAB — BASIC METABOLIC PANEL
Anion gap: 10 (ref 5–15)
BUN: 19 mg/dL (ref 8–23)
CO2: 22 mmol/L (ref 22–32)
Calcium: 9.4 mg/dL (ref 8.9–10.3)
Chloride: 103 mmol/L (ref 98–111)
Creatinine, Ser: 0.41 mg/dL — ABNORMAL LOW (ref 0.44–1.00)
GFR, Estimated: >60 mL/min (ref >60)
Glucose, Bld: 134 mg/dL — ABNORMAL HIGH (ref 70–99)
Potassium: 3.9 mmol/L (ref 3.5–5.1)
Sodium: 135 mmol/L (ref 135–145)

## 2023-05-14 LAB — TROPONIN I (HIGH SENSITIVITY)
Troponin I (High Sensitivity): 19 ng/L — ABNORMAL HIGH (ref <18)
Troponin I (High Sensitivity): 24 ng/L — ABNORMAL HIGH (ref <18)

## 2023-05-14 MED ORDER — ACETAMINOPHEN 325 MG PO TABS: 650.0000 mg | ORAL_TABLET | Freq: Four times a day (QID) | ORAL | 0 refills | Status: AC | PRN

## 2023-05-14 MED ORDER — IOHEXOL 350 MG/ML SOLN
75.0000 mL | Freq: Once | INTRAVENOUS | Status: AC | PRN
  Administered 2023-05-14: 75 mL via INTRAVENOUS

## 2023-05-14 NOTE — Discharge Instructions (Addendum)
Please follow up with your primary care doctor within 2-3 days. For constipation we also recommend a diet high in fiber (beans, fruits, vegetables, whole grains). Take Colace 100-200 mg up to three times per day. You may take along with Senokot 1-2 tabs, ingest with full glass of water.  You may also take MiraLAX 1-2 capfuls 1-2 times a day until stools become soft and then slowly decrease the amount of MiraLAX used.  Maintain fluid intake 6-8 glasses per day. Please increase fibers in your diet. You may also take Milk of Magnesia 30 mL as needed for constipation, you may repeat in 2 hours again if no bowl movement.  Your gallbladder appears abnormal on CT, please see your PCP for follow up, may need ultrasound in outpatient setting  It was a pleasure caring for you today in the emergency department.  Please return to the emergency department for any worsening or worrisome symptoms.

## 2023-05-14 NOTE — ED Notes (Signed)
Patient transported to CT

## 2023-05-14 NOTE — ED Triage Notes (Addendum)
Pt arrived from home via [facility] c/o chest pain described as pressure 8/10 that began approx 3 days ago. Enroute pt c/o bilateral knee pain also 325mg  ASA, 0.4 SL nitro EMS VS Bp 130/60 P 99 O2 95 RA

## 2023-05-14 NOTE — ED Provider Notes (Signed)
[place] EMERGENCY DEPARTMENT AT [facility] Provider Note  CSN: [id number] Arrival date & time: 05/14/23 2542  Chief Complaint(s) Chest Pain  HPI Jodi Jefferson is a 89 y.o. female with past medical history as below, significant for hypertension, pelvic fracture, hyperlipidemia, GERD, who presents to the ED with complaint of epigastric pain.  She is here with son who is primary caretaker.  She is been having pain over the past few days, epigastrium radiation to her back.  Stabbing at times.  Currently pain-free.  Also has left side hip/pelvis, knee pain; recent pelvis fx (seen in ED for this) no falls recently.  Unable to identify alleviating/ exacerbating factors.  No vomiting or nausea.  No fevers or chills.  No dyspnea.  No numbness or weakness to extremities.  Son resports she has not had a bowel movement in around 1 week. Recurrent constipation per son. She is pain free at this time.   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 Authorizing Provider  acetaminophen (TYLENOL) 325 MG tablet Take 2 tablets (650 mg total) by mouth every 6 (six) hours as needed. 05/14/23  Yes Tanda Rockers A, DO  amoxicillin (AMOXIL) 500 MG capsule Take 2,000 mg by mouth See admin instructions. Take 4 capsules (2000 mg) by mouth one hour prior to dental appointments 08/04/21   [provider]  bisacodyl (DULCOLAX) 10 MG suppository Place 1 suppository (10 mg total) rectally daily as needed for moderate constipation. 10/20/21   Joseph Art, DO  bisacodyl (DULCOLAX) 5 MG EC tablet Take 2 tablets (10 mg total) by mouth daily as needed for moderate constipation or mild constipation. Patient not taking: Reported on 02/16/2022 10/20/21   Joseph Art, DO  Calcium & Magnesium Carbonates (MYLANTA PO) Take 10 mLs by mouth daily as needed (constipation).    [provider]  calcium carbonate (TUMS EX) 750 MG chewable tablet Chew 750 mg by mouth daily as needed for heartburn.    [provider]  carboxymethylcellulose (REFRESH PLUS) 0.5 % SOLN Place 1 drop into both eyes 4 (four) times daily as needed (dry eyes).    [provider]  cyanocobalamin (VITAMIN B12) 1000 MCG tablet Take 1,000 mcg by mouth daily.    [provider]  denosumab (PROLIA) 60 MG/ML SOSY injection Inject 60 mg into the skin every 6 (six) months.    [provider]  Famotidine (PEPCID PO) Take  1 tablet by mouth daily as needed (acid reflux).    [provider]  lidocaine (HM LIDOCAINE PATCH) 4 % Place 1 patch onto the skin daily for 15 days. 05/01/23 05/16/23  Coral Spikes, DO  lisinopril (ZESTRIL) 10 MG tablet Take 10 mg by mouth every evening.    [provider]  Magnesium 250 MG TABS Take 250 mg by mouth daily.    [provider]  Multiple Vitamins-Minerals (CENTRUM SILVER PO) Take 1 tablet by  mouth daily.    [provider]  Multiple Vitamins-Minerals (ZINC PO) Take 1 tablet by mouth daily.    [provider]  Omega-3 1000 MG CAPS Take 2,000 mg by mouth daily.    [provider]  polyethylene glycol (MIRALAX / GLYCOLAX) 17 g packet Take 17 g by mouth 2 (two) times daily.    [provider]  senna (SENOKOT) 8.6 MG TABS tablet Take 2 tablets (17.2 mg total) by mouth daily. Patient not taking: Reported on 02/16/2022 10/20/21   Joseph Art, DO                                                                                                                                    Past Surgical History Past Surgical History:  Procedure Laterality Date   ABDOMINAL HYSTERECTOMY     APPENDECTOMY     COLONOSCOPY     many years ago    ESOPHAGOGASTRODUODENOSCOPY  2010   ESOPHAGOGASTRODUODENOSCOPY (EGD) WITH PROPOFOL N/A 10/22/2021   Procedure: ESOPHAGOGASTRODUODENOSCOPY (EGD) WITH PROPOFOL;  Surgeon: Iva Boop, MD;  Location: [facility] ENDOSCOPY;  Service: Gastroenterology;  Laterality: N/A;   REFRACTIVE SURGERY     TONSILLECTOMY     Family History Family History  Problem Relation Age of Onset   Heart attack Father    Prostate cancer Paternal Grandfather    Colon cancer Neg Hx    Esophageal cancer Neg Hx    Rectal cancer Neg Hx    Stomach cancer Neg Hx     Social History Social History   Tobacco Use   Smoking status: Never   Smokeless tobacco: Never  Vaping Use   Vaping status: Never Used  Substance Use Topics   Alcohol use: No   Drug use: No   Allergies Aspirin, Macrobid [nitrofurantoin macrocrystal], Motrin [ibuprofen], Polyethylene glycol, Prednisone, Red yeast rice [cholestin], Sulfa antibiotics, and Codeine  Review of Systems Review of Systems  Constitutional:  Negative for chills and fever.  Respiratory:  Negative for chest tightness and shortness of breath.   Cardiovascular:  Negative for chest pain.  Gastrointestinal:  Positive  for abdominal pain and constipation. Negative for nausea.  Genitourinary:  Negative for dyspareunia.  Musculoskeletal:  Positive for arthralgias.  Neurological:  Negative for facial asymmetry and numbness.  Psychiatric/Behavioral:  Negative for agitation.   All other systems reviewed and are negative.   Physical Exam Vital Signs  I have  reviewed the triage vital signs BP 115/78   Pulse 72   Temp 98.7 F (37.1 C) (Oral)   Resp 18   Ht 4\' 10"  (1.473 m)   Wt 44 kg   SpO2 98%   BMI 20.27 kg/m  Physical Exam Vitals and nursing note reviewed.  Constitutional:      General: She is not in acute distress.    Appearance: Normal appearance.     Comments: frail  HENT:     Head: Normocephalic and atraumatic.     Right Ear: External ear normal.     Left Ear: External ear normal.     Nose: Nose normal.     Mouth/Throat:     Mouth: Mucous membranes are moist.  Eyes:     General: No scleral icterus.       Right eye: No discharge.        Left eye: No discharge.  Cardiovascular:     Rate and Rhythm: Normal rate and regular rhythm.     Pulses: Normal pulses.     Heart sounds: Normal heart sounds.  Pulmonary:     Effort: Pulmonary effort is normal. No respiratory distress.     Breath sounds: Normal breath sounds. No stridor.  Abdominal:     General: Abdomen is flat. There is no distension.     Palpations: Abdomen is soft.     Tenderness: There is no abdominal tenderness.  Musculoskeletal:     Cervical back: No rigidity.     Right lower leg: No edema.     Left lower leg: No edema.  Skin:    General: Skin is warm and dry.     Capillary Refill: Capillary refill takes less than 2 seconds.  Neurological:     Mental Status: She is alert and oriented to person, place, and time.     GCS: GCS eye subscore is 4. GCS verbal subscore is 5. GCS motor subscore is 6.     Comments: Hard of hearing  Psychiatric:        Mood and Affect: Mood normal.        Behavior: Behavior normal. Behavior  is cooperative.     ED Results and Treatments Labs (all labs ordered are listed, but only abnormal results are displayed) Labs Reviewed  BASIC METABOLIC PANEL - Abnormal; Notable for the following components:      Result Value   Glucose, Bld 134 (*)    Creatinine, Ser 0.41 (*)    All other components within normal limits  CBC - Abnormal; Notable for the following components:   WBC 17.7 (*)    All other components within normal limits  TROPONIN I (HIGH SENSITIVITY) - Abnormal; Notable for the following components:   Troponin I (High Sensitivity) 19 (*)    All other components within normal limits  TROPONIN I (HIGH SENSITIVITY) - Abnormal; Notable for the following components:   Troponin I (High Sensitivity) 24 (*)    All other components within normal limits  Radiology CT ABDOMEN PELVIS W CONTRAST Result Date: 05/14/2023 CLINICAL DATA:  Right lower quadrant pain with constipation. EXAM: CT ABDOMEN AND PELVIS WITH CONTRAST TECHNIQUE: Multidetector CT imaging of the abdomen and pelvis was performed using the standard protocol following bolus administration of intravenous contrast. RADIATION DOSE REDUCTION: This exam was performed according to the departmental dose-optimization program which includes automated exposure control, adjustment of the mA and/or kV according to patient size and/or use of iterative reconstruction technique. CONTRAST:  75mL OMNIPAQUE IOHEXOL 350 MG/ML SOLN COMPARISON:  02/16/2022 FINDINGS: Lower chest: Unremarkable. Hepatobiliary: Calcified granulomata noted in the left hepatic lobe. Mild focal gallbladder wall thickening seen towards the fundus. No intrahepatic or extrahepatic biliary dilation. Pancreas: No focal mass lesion. No dilatation of the main duct. No intraparenchymal cyst. No peripancreatic edema. Spleen: No splenomegaly. No suspicious focal  mass lesion. Adrenals/Urinary Tract: No adrenal nodule or mass. Tiny well-defined homogeneous low-density lesions in both kidneys are too small to characterize but are statistically most likely benign and probably cysts. No followup imaging is recommended. No evidence for hydroureter. The urinary bladder appears normal for the degree of distention. Stomach/Bowel: Stomach is unremarkable. No gastric wall thickening. No evidence of outlet obstruction. Duodenum is normally positioned as is the ligament of Treitz. No small bowel wall thickening. No small bowel dilatation. Neither the terminal ileum nor the appendix are discretely visible. There is a large volume of stool in the right colon with moderate gaseous dilatation of the transverse colon. Left colon is largely decompressed although there is prominent gas visible in the rectum. No evidence for colonic wall thickening. Vascular/Lymphatic: There is moderate atherosclerotic calcification of the abdominal aorta without aneurysm. There is no gastrohepatic or hepatoduodenal ligament lymphadenopathy. No retroperitoneal or mesenteric lymphadenopathy. No pelvic sidewall lymphadenopathy. Reproductive: Hysterectomy.  There is no adnexal mass. Other: No intraperitoneal free fluid. Musculoskeletal: A right groin hernia contains small bowel and fat. No edema or fluid in the hernia sac to suggest incarceration. No small bowel dilatation proximal to the hernia to suggest obstructive features. Advanced degenerative changes are noted in the hips, left greater than right. Deformity of the superior and inferior pubic rami is compatible with healed fractures. Bilateral sacral alar fractures evident. Right sacral fracture appears subacute to chronic chronic with nonunion while the left sacral fracture appears acute (see coronal 44/6 IMPRESSION: 1. Large volume of stool in the right colon with moderate gaseous dilatation of the transverse colon. No evidence for colonic wall thickening.  No obstructing mass lesion evident and prominent gas is visible in the distal sigmoid colon and rectum. 2. Right groin hernia contains small bowel and fat. No edema or fluid in the hernia sac to suggest incarceration. No small bowel dilatation proximal to the hernia to suggest obstructive process. 3. Bilateral sacral ala fractures. Right sacral fracture appears subacute to chronic with nonunion while the left sacral fracture appears acute. 4. Mild focal gallbladder wall thickening towards the fundus. While not overtly concerning by appearance, this is not have typical features of adenoma mitosis. Right upper quadrant ultrasound could be performed to further evaluate as clinically warranted. 5.  Aortic Atherosclerosis (ICD10-I70.0). Electronically Signed   By: Kennith Center M.D.   On: 05/14/2023 06:11   DG Chest 2 View Result Date: 05/14/2023 CLINICAL DATA:  Chest pain . EXAM: CHEST - 2 VIEW COMPARISON:  None Available. FINDINGS: Lungs are clear. No pneumothorax or pleural effusion. Cardiac size is mildly enlarged. Pulmonary vascularity is normal. Moderate thoracic dextroscoliosis. Osseous structures are diffusely osteopenic.  Mild age-indeterminate midthoracic compression deformity noted IMPRESSION: 1. Mild cardiomegaly. 2. Mild age-indeterminate midthoracic compression deformity. If clinically indicated, this could be better assessed with MRI examination. Electronically Signed   By: Helyn Numbers M.D.   On: 05/14/2023 04:00    Pertinent labs & imaging results that were available during my care of the patient were reviewed by me and considered in my medical decision making (see MDM for details).  Medications Ordered in ED Medications  iohexol (OMNIPAQUE) 350 MG/ML injection 75 mL (75 mLs Intravenous Contrast Given 05/14/23 0553)                                                                                                                                     Procedures Procedures  (including critical  care time)  Medical Decision Making / ED Course    Medical Decision Making:    Sareena Odeh is a 89 y.o. female with past medical history as below, significant for hypertension, pelvic fracture, hyperlipidemia, GERD, who presents to the ED with complaint of epigastric pain.. The complaint involves an extensive differential diagnosis and also carries with it a high risk of complications and morbidity.  Serious etiology was considered. Ddx includes but is not limited to: Differential diagnosis includes but is not exclusive to acute cholecystitis, intrathoracic causes for epigastric abdominal pain, gastritis, duodenitis, pancreatitis, small bowel or large bowel obstruction, abdominal aortic aneurysm, hernia, gastritis, etc.   Complete initial physical exam performed, notably the patient was in no distress, HDS, abd benign.    Reviewed and confirmed nursing documentation for past medical history, family history, social history.  Vital signs reviewed.    Clinical Course as of 05/14/23 4098  Caleen Essex May 14, 2023  0628 CT w/ constipation [SG]  [id number] Troponin I (High Sensitivity)(!): 24 Rpt trop is flat [SG]  0647 WBC(!): 17.7 Chronically elevated, no fever, doubt sepsis [SG]    Clinical Course User Index [SG] Sloan Leiter, DO    Brief summary: 89 year old female history as above here with epigastric pain rating to her back, constipation.  Son historian, primary caretaker.  Labs reviewed, similar to prior  Ct reviewed, shows constipation, no sbo   " IMPRESSION:  1. Large volume of stool in the right colon with moderate gaseous  dilatation of the transverse colon. No evidence for colonic wall  thickening. No obstructing mass lesion evident and prominent gas is  visible in the distal sigmoid colon and rectum.  2. Right groin hernia contains small bowel and fat. No edema or  fluid in the hernia sac to suggest incarceration. No small bowel  dilatation proximal to the hernia to  suggest obstructive process.  3. Bilateral sacral ala fractures. Right sacral fracture appears  subacute to chronic with nonunion while the left sacral fracture  appears acute.  4. Mild focal gallbladder wall thickening towards the fundus. While  not overtly concerning by appearance, this is not  have typical  features of adenoma mitosis. Right upper quadrant ultrasound could  be performed to further evaluate as clinically warranted.  5.  Aortic Atherosclerosis (ICD10-I70.0).  "   She has no RUQ pain on palpations, labs are stable, doubt acute cholecystitis, advised o/p ultrasound  Feeling better overall, no obstruction.   Plan for discharge with outpatient f/u   The patient improved significantly and was discharged in stable condition. Detailed discussions were had with the patient/guardian regarding current findings, and need for close f/u with PCP or on call doctor. The patient/guardian has been instructed to return immediately if the symptoms worsen in any way for re-evaluation. Patient/guardian verbalized understanding and is in agreement with current care plan. All questions answered prior to discharge.            Additional history obtained: -Additional history obtained from family -External records from outside source obtained and reviewed including: Chart review including previous notes, labs, imaging, consultation notes including  prior ED evaluation, primary care documentation, medications   Lab Tests: -I ordered, reviewed, and interpreted labs.   The pertinent results include:   Labs Reviewed  BASIC METABOLIC PANEL - Abnormal; Notable for the following components:      Result Value   Glucose, Bld 134 (*)    Creatinine, Ser 0.41 (*)    All other components within normal limits  CBC - Abnormal; Notable for the following components:   WBC 17.7 (*)    All other components within normal limits  TROPONIN I (HIGH SENSITIVITY) - Abnormal; Notable for the following  components:   Troponin I (High Sensitivity) 19 (*)    All other components within normal limits  TROPONIN I (HIGH SENSITIVITY) - Abnormal; Notable for the following components:   Troponin I (High Sensitivity) 24 (*)    All other components within normal limits    Notable for as above, stable  EKG   EKG Interpretation Date/Time:  Friday May 14 2023 03:31:40 EST Ventricular Rate:  78 PR Interval:  138 QRS Duration:  90 QT Interval:  380 QTC Calculation: 433 R Axis:   2  Text Interpretation: Normal sinus rhythm Minimal voltage criteria for LVH, may be normal variant ( Cornell product ) Borderline ECG When compared with ECG of 08-May-2023 17:30, PREVIOUS ECG IS PRESENT twi noted peaked t waves diffusely Confirmed by Tanda Rockers ([id number]) on 05/14/2023 3:36:34 AM         Imaging Studies ordered: I ordered imaging studies including CTAP/ CXR I independently visualized the following imaging with scope of interpretation limited to determining acute life threatening conditions related to emergency care; findings noted above I independently visualized and interpreted imaging. I agree with the radiologist interpretation   Medicines ordered and prescription drug management: Meds ordered this encounter  Medications   iohexol (OMNIPAQUE) 350 MG/ML injection 75 mL   acetaminophen (TYLENOL) 325 MG tablet    Sig: Take 2 tablets (650 mg total) by mouth every 6 (six) hours as needed.    Dispense:  36 tablet    Refill:  0    -I have reviewed the patients home medicines and have made adjustments as needed   Consultations Obtained: na   Cardiac Monitoring: The patient was maintained on a cardiac monitor.  I personally viewed and interpreted the cardiac monitored which showed an underlying rhythm of: NSR Continuous pulse oximetry interpreted by myself, 100% on RA.    Social Determinants of Health:  Diagnosis or treatment significantly limited by social determinants of health:  na   Reevaluation: After the interventions noted above, I reevaluated the patient and found that they have improved  Co morbidities that complicate the patient evaluation  Past Medical History:  Diagnosis Date   Abnormal EKG 01/26/2017   Allergy    Anxiety 01/26/2017   Arthritis 01/26/2017   Benign essential hypertension 01/26/2017   Cataract    Chest pain 01/26/2017   Closed pelvic fracture (HCC) 01/26/2017   GERD (gastroesophageal reflux disease) 01/26/2017   Hyperlipemia, mixed 01/26/2017   Macular degeneration 01/26/2017   Osteoporosis 01/26/2017      Dispostion: Disposition decision including need for hospitalization was considered, and patient discharged from emergency department.    Final Clinical Impression(s) / ED Diagnoses Final diagnoses:  Constipation, unspecified constipation type  Closed fracture of sacrum with routine healing, unspecified portion of sacrum, subsequent encounter        Sloan Leiter, DO 05/14/23 1610

## 2023-05-14 NOTE — Progress Notes (Unsigned)
Complex Care Management Note Care Guide Note  05/14/2023 Name: Jodi Jefferson MRN: [id number] DOB: 09-25-1931   Complex Care Management Outreach Attempts: An unsuccessful telephone outreach was attempted today to offer the patient information about available complex care management services.  Follow Up Plan:  Additional outreach attempts will be made to offer the patient complex care management information and services.   Encounter Outcome:  No Answer  [name], CMA, Care Guide [facility]  [facility], [facility] Guide Direct Dial: [phone]  Fax: [phone] Website: [email or web address]

## 2023-05-14 NOTE — ED Notes (Signed)
Patient Alert and oriented to baseline. Stable and ambulatory to baseline. Patient verbalized understanding of the discharge instructions.  Patient belongings were taken by the patient.

## 2023-05-18 NOTE — Progress Notes (Unsigned)
Complex Care Management Note Care Guide Note  05/18/2023 Name: Jodi Jefferson MRN: [id number] DOB: [birth year]   Complex Care Management Outreach Attempts: A second unsuccessful outreach was attempted today to offer the patient with information about available complex care management services.  Follow Up Plan:  Additional outreach attempts will be made to offer the patient complex care management information and services.   Encounter Outcome:  No Answer  [name], CMA, Care Guide [facility]  [facility] Guide Direct Dial: [phone]  Fax: [phone] Website: [email or web address]

## 2023-05-20 NOTE — Progress Notes (Signed)
Complex Care Management Note  Care Guide Note 05/20/2023 Name: Jodi Jefferson MRN: [id number] DOB: 05-Apr-1932  [name] is a 89 y.o. year old female who sees [facility] for primary care. I reached out to Rowan Blase by phone today to offer complex care management services.  [name] was given information about Complex Care Management services today including:   The Complex Care Management services include support from the care team which includes your Nurse Coordinator, Clinical Social Worker, or Pharmacist.  The Complex Care Management team is here to help remove barriers to the health concerns and goals most important to you. Complex Care Management services are voluntary, and the patient may decline or stop services at any time by request to their care team member.   Complex Care Management Consent Status: Patient did not agree to participate in complex care management services at this time.  Follow up plan:  pt declined at this time   Encounter Outcome:  Patient Refused  [name], CMA, Care Guide [facility]  [facility], [facility] Guide Direct Dial: [phone]  Fax: [phone] Website: [email or web address]

## 2023-05-29 ENCOUNTER — Inpatient Hospital Stay (HOSPITAL_COMMUNITY)
Admission: EM | Admit: 2023-05-29 | Discharge: 2023-06-02 | DRG: 811 | Disposition: A | Discharge status: Hospice | Payer: Medicare Other | Attending: Internal Medicine | Admitting: Internal Medicine

## 2023-05-29 ENCOUNTER — Other Ambulatory Visit: Payer: Self-pay

## 2023-05-29 ENCOUNTER — Encounter (HOSPITAL_COMMUNITY): Payer: Self-pay | Admitting: Emergency Medicine

## 2023-05-29 DIAGNOSIS — K269 Duodenal ulcer, unspecified as acute or chronic, without hemorrhage or perforation: Secondary | ICD-10-CM

## 2023-05-29 DIAGNOSIS — Z886 Allergy status to analgesic agent status: Secondary | ICD-10-CM

## 2023-05-29 DIAGNOSIS — D62 Acute posthemorrhagic anemia: Secondary | ICD-10-CM | POA: Diagnosis not present

## 2023-05-29 DIAGNOSIS — K760 Fatty (change of) liver, not elsewhere classified: Secondary | ICD-10-CM | POA: Diagnosis present

## 2023-05-29 DIAGNOSIS — K921 Melena: Secondary | ICD-10-CM | POA: Diagnosis not present

## 2023-05-29 DIAGNOSIS — D72829 Elevated white blood cell count, unspecified: Secondary | ICD-10-CM | POA: Diagnosis present

## 2023-05-29 DIAGNOSIS — E538 Deficiency of other specified B group vitamins: Secondary | ICD-10-CM | POA: Diagnosis present

## 2023-05-29 DIAGNOSIS — K222 Esophageal obstruction: Secondary | ICD-10-CM | POA: Diagnosis present

## 2023-05-29 DIAGNOSIS — M810 Age-related osteoporosis without current pathological fracture: Secondary | ICD-10-CM | POA: Diagnosis present

## 2023-05-29 DIAGNOSIS — K449 Diaphragmatic hernia without obstruction or gangrene: Secondary | ICD-10-CM | POA: Diagnosis present

## 2023-05-29 DIAGNOSIS — Z888 Allergy status to other drugs, medicaments and biological substances status: Secondary | ICD-10-CM

## 2023-05-29 DIAGNOSIS — E876 Hypokalemia: Secondary | ICD-10-CM | POA: Diagnosis present

## 2023-05-29 DIAGNOSIS — I10 Essential (primary) hypertension: Secondary | ICD-10-CM | POA: Diagnosis present

## 2023-05-29 DIAGNOSIS — A419 Sepsis, unspecified organism: Secondary | ICD-10-CM | POA: Diagnosis present

## 2023-05-29 DIAGNOSIS — Z882 Allergy status to sulfonamides status: Secondary | ICD-10-CM

## 2023-05-29 DIAGNOSIS — K264 Chronic or unspecified duodenal ulcer with hemorrhage: Principal | ICD-10-CM | POA: Diagnosis present

## 2023-05-29 DIAGNOSIS — Z8249 Family history of ischemic heart disease and other diseases of the circulatory system: Secondary | ICD-10-CM

## 2023-05-29 DIAGNOSIS — Z515 Encounter for palliative care: Secondary | ICD-10-CM

## 2023-05-29 DIAGNOSIS — Z66 Do not resuscitate: Secondary | ICD-10-CM | POA: Diagnosis present

## 2023-05-29 DIAGNOSIS — E872 Acidosis, unspecified: Secondary | ICD-10-CM | POA: Diagnosis present

## 2023-05-29 DIAGNOSIS — R0682 Tachypnea, not elsewhere classified: Secondary | ICD-10-CM | POA: Diagnosis present

## 2023-05-29 DIAGNOSIS — K922 Gastrointestinal hemorrhage, unspecified: Secondary | ICD-10-CM | POA: Diagnosis present

## 2023-05-29 DIAGNOSIS — E782 Mixed hyperlipidemia: Secondary | ICD-10-CM | POA: Diagnosis present

## 2023-05-29 DIAGNOSIS — J449 Chronic obstructive pulmonary disease, unspecified: Secondary | ICD-10-CM | POA: Diagnosis present

## 2023-05-29 DIAGNOSIS — K219 Gastro-esophageal reflux disease without esophagitis: Secondary | ICD-10-CM | POA: Diagnosis present

## 2023-05-29 DIAGNOSIS — Z885 Allergy status to narcotic agent status: Secondary | ICD-10-CM

## 2023-05-29 DIAGNOSIS — K529 Noninfective gastroenteritis and colitis, unspecified: Secondary | ICD-10-CM | POA: Diagnosis present

## 2023-05-29 DIAGNOSIS — F0390 Unspecified dementia without behavioral disturbance: Secondary | ICD-10-CM | POA: Diagnosis present

## 2023-05-29 LAB — CBC WITH DIFFERENTIAL/PLATELET
Abs Immature Granulocytes: 0.16 10*3/uL — ABNORMAL HIGH (ref 0.00–0.07)
Basophils Absolute: 0 10*3/uL (ref 0.0–0.1)
Basophils Relative: 0 %
Eosinophils Absolute: 0 10*3/uL (ref 0.0–0.5)
Eosinophils Relative: 0 %
HCT: 25.7 % — ABNORMAL LOW (ref 36.0–46.0)
Hemoglobin: 8.4 g/dL — ABNORMAL LOW (ref 12.0–15.0)
Immature Granulocytes: 1 %
Lymphocytes Relative: 5 %
Lymphs Abs: 1.3 10*3/uL (ref 0.7–4.0)
MCH: 32.1 pg (ref 26.0–34.0)
MCHC: 32.7 g/dL (ref 30.0–36.0)
MCV: 98.1 fL (ref 80.0–100.0)
Monocytes Absolute: 1.2 10*3/uL — ABNORMAL HIGH (ref 0.1–1.0)
Monocytes Relative: 5 %
Neutro Abs: 23.9 10*3/uL — ABNORMAL HIGH (ref 1.7–7.7)
Neutrophils Relative %: 89 %
Platelets: 363 10*3/uL (ref 150–400)
RBC: 2.62 MIL/uL — ABNORMAL LOW (ref 3.87–5.11)
RDW: 14.8 % (ref 11.5–15.5)
WBC: 26.7 10*3/uL — ABNORMAL HIGH (ref 4.0–10.5)
nRBC: 0.1 % (ref 0.0–0.2)

## 2023-05-29 LAB — I-STAT CHEM 8, ED
BUN: 40 mg/dL — ABNORMAL HIGH (ref 8–23)
Calcium, Ion: 1.13 mmol/L — ABNORMAL LOW (ref 1.15–1.40)
Chloride: 106 mmol/L (ref 98–111)
Creatinine, Ser: 0.4 mg/dL — ABNORMAL LOW (ref 0.44–1.00)
Glucose, Bld: 187 mg/dL — ABNORMAL HIGH (ref 70–99)
HCT: 26 % — ABNORMAL LOW (ref 36.0–46.0)
Hemoglobin: 8.8 g/dL — ABNORMAL LOW (ref 12.0–15.0)
Potassium: 3.7 mmol/L (ref 3.5–5.1)
Sodium: 138 mmol/L (ref 135–145)
TCO2: 22 mmol/L (ref 22–32)

## 2023-05-29 LAB — POC OCCULT BLOOD, ED: Fecal Occult Bld: POSITIVE — AB

## 2023-05-29 LAB — PROTIME-INR
INR: 1 (ref 0.8–1.2)
Prothrombin Time: 13.5 s (ref 11.4–15.2)

## 2023-05-29 MED ORDER — PANTOPRAZOLE SODIUM 40 MG IV SOLR
40.0000 mg | Freq: Three times a day (TID) | INTRAVENOUS | Status: DC
  Administered 2023-05-30 – 2023-06-01 (×8): 40 mg via INTRAVENOUS
  Filled 2023-05-29 (×8): qty 10

## 2023-05-29 MED ORDER — PANTOPRAZOLE SODIUM 40 MG IV SOLR
40.0000 mg | INTRAVENOUS | Status: AC
  Administered 2023-05-29 (×2): 40 mg via INTRAVENOUS
  Filled 2023-05-29 (×2): qty 10

## 2023-05-29 MED ORDER — PANTOPRAZOLE SODIUM 40 MG IV SOLR
40.0000 mg | Freq: Two times a day (BID) | INTRAVENOUS | Status: DC
  Administered 2023-06-02: 40 mg via INTRAVENOUS
  Filled 2023-05-29: qty 10

## 2023-05-29 MED ORDER — SODIUM CHLORIDE 0.9 % IV BOLUS
1000.0000 mL | Freq: Once | INTRAVENOUS | Status: AC
  Administered 2023-05-29: 1000 mL via INTRAVENOUS

## 2023-05-29 NOTE — ED Notes (Addendum)
 Pt had large loose bowel movement, dark red in color. MD Cardama present at bedside.

## 2023-05-29 NOTE — ED Provider Notes (Signed)
 [place] EMERGENCY DEPARTMENT AT [facility] Provider Note  CSN: [id number] Arrival date & time: 05/29/23 2244  Chief Complaint(s) Rectal Bleeding  HPI Clois Montavon is a 89 y.o. female here for large amount of melenic stool.  Patient is accompanied by her son who reports that they have noted some bowel movements concerning for blood over the past couple days.  This is discussed with the home nurse to recommend keeping an eye on it however the bleeding became more significant today prompting their visit.  Patient has been complaining of left-sided abdominal pain for the past several days as well.  She is not anticoagulated.  No history of ulcers.  No fevers or chills.  No nausea or vomiting.  The history is provided by the patient and a relative.    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  Authorizing Provider  acetaminophen  (TYLENOL ) 325 MG tablet Take 2 tablets (650 mg total) by mouth every 6 (six) hours as needed. 05/14/23   Elnor Jayson LABOR, DO  amoxicillin (AMOXIL) 500 MG capsule Take 2,000 mg by mouth See admin instructions. Take 4 capsules (2000 mg) by mouth one hour prior to dental appointments 08/04/21   [provider]  bisacodyl  (DULCOLAX) 10 MG suppository Place 1 suppository (10 mg total) rectally daily as needed for moderate constipation. 10/20/21   Vann, Jessica U, DO  bisacodyl  (DULCOLAX) 5 MG EC tablet Take 2 tablets (10 mg total) by mouth daily as needed for moderate constipation or mild constipation. Patient not taking: Reported on 02/16/2022 10/20/21   Vann, Jessica U, DO  Calcium & Magnesium  Carbonates (MYLANTA PO) Take 10 mLs by mouth daily as needed (constipation).    [provider]  calcium carbonate (TUMS EX) 750 MG chewable tablet Chew 750 mg by mouth daily as needed for heartburn.    [provider]  carboxymethylcellulose (REFRESH PLUS) 0.5 % SOLN Place 1 drop into both eyes 4 (four) times daily as needed (dry eyes).    [provider]  cyanocobalamin (VITAMIN B12) 1000 MCG tablet Take 1,000 mcg by mouth daily.    [provider]  denosumab  (PROLIA ) 60 MG/ML SOSY injection Inject 60 mg into the skin every 6 (six) months.    [provider]  Famotidine  (PEPCID  PO) Take 1 tablet by mouth daily as needed (acid reflux).    [provider]  lisinopril  (ZESTRIL ) 10  MG tablet Take 10 mg by mouth every evening.    [provider]  Magnesium  250 MG TABS Take 250 mg by mouth daily.    [provider]  Multiple Vitamins-Minerals (CENTRUM SILVER PO) Take 1 tablet by mouth daily.    [provider]  Multiple Vitamins-Minerals (ZINC PO) Take 1 tablet by mouth daily.    [provider]  Omega-3 1000 MG CAPS Take 2,000 mg by mouth daily.    [provider]  polyethylene  glycol (MIRALAX / GLYCOLAX) 17 g packet Take 17 g by mouth 2 (two) times daily.    [provider]  senna (SENOKOT) 8.6 MG TABS tablet Take 2 tablets (17.2 mg total) by mouth daily. Patient not taking: Reported on 02/16/2022 10/20/21   Vann, Jessica U, DO                                                                                                                                    Allergies Aspirin, Macrobid [nitrofurantoin macrocrystal], Motrin [ibuprofen], Polyethylene glycol, Prednisone, Red yeast rice [cholestin], Sulfa antibiotics, and Codeine  Review of Systems Review of Systems  Gastrointestinal:  Positive for hematochezia.   As noted in HPI  Physical Exam Vital Signs  I have reviewed the triage vital signs BP 115/84   Pulse 87   Temp 97.7 F (36.5 C) (Oral)   Resp (!) 21   Ht 4' 10 (1.473 m)   Wt 44 kg   SpO2 97%   BMI 20.27 kg/m   Physical Exam Vitals reviewed.  Constitutional:      General: She is not in acute distress.    Appearance: She is well-developed. She is ill-appearing. She is not diaphoretic.  HENT:     Head: Normocephalic and atraumatic.     Right Ear: External ear normal.     Left Ear: External ear normal.     Nose: Nose normal.  Eyes:     General: No scleral icterus.    Conjunctiva/sclera: Conjunctivae normal.  Neck:     Trachea: Phonation normal.  Cardiovascular:     Rate and Rhythm: Normal rate and regular rhythm.  Pulmonary:     Effort: Pulmonary effort is normal. No respiratory distress.     Breath sounds: No stridor.  Abdominal:     General: There is no distension.     Tenderness: There is abdominal tenderness in the left lower quadrant.  Genitourinary:    Comments: Melenic stools Musculoskeletal:        General: Normal range of motion.     Cervical back: Normal range of motion.  Neurological:     Mental Status: She is alert and oriented to person, place, and time.  Psychiatric:        Behavior: Behavior normal.      ED Results and Treatments Labs (all labs ordered are listed, but only abnormal results are displayed) Labs Reviewed  COMPREHENSIVE  METABOLIC PANEL - Abnormal; Notable for the following components:      Result Value   Glucose, Bld 189 (*)    BUN 47 (*)    Total Protein 5.6 (*)    Albumin 2.8 (*)    Alkaline Phosphatase 169 (*)    All other components within normal limits  CBC WITH DIFFERENTIAL/PLATELET - Abnormal; Notable for the following components:   WBC 26.7 (*)    RBC 2.62 (*)    Hemoglobin 8.4 (*)    HCT 25.7 (*)    Neutro Abs 23.9 (*)    Monocytes Absolute 1.2 (*)    Abs Immature Granulocytes 0.16 (*)    All other components within normal limits  POC OCCULT BLOOD, ED - Abnormal; Notable for the following components:   Fecal Occult Bld POSITIVE (*)    All other components within normal limits  I-STAT CG4 LACTIC ACID, ED - Abnormal; Notable for the following components:   Lactic Acid, Venous 2.6 (*)    All other components within normal limits  I-STAT CHEM 8, ED - Abnormal; Notable for the following components:   BUN 40 (*)    Creatinine, Ser 0.40 (*)    Glucose, Bld 187 (*)    Calcium, Ion 1.13 (*)    Hemoglobin 8.8 (*)    HCT 26.0 (*)    All other components within normal limits  CULTURE, BLOOD (SINGLE)  PROTIME-INR  CBC WITH DIFFERENTIAL/PLATELET  COMPREHENSIVE METABOLIC PANEL  MAGNESIUM   HEMOGLOBIN AND HEMATOCRIT, BLOOD  HEMOGLOBIN AND HEMATOCRIT, BLOOD  URINALYSIS, COMPLETE (UACMP) WITH MICROSCOPIC  IRON AND TIBC  FERRITIN  I-STAT CG4 LACTIC ACID, ED  TYPE AND SCREEN  ABO/RH  PREPARE RBC (CROSSMATCH)                                                                                                                         EKG  EKG Interpretation Date/Time:    Ventricular Rate:    PR Interval:    QRS Duration:    QT Interval:    QTC Calculation:   R Axis:      Text Interpretation:         Radiology CT ANGIO GI BLEED Result Date:  05/30/2023 CLINICAL DATA:  Lower GI/rectal bleed. EXAM: CTA ABDOMEN AND PELVIS WITHOUT AND WITH CONTRAST TECHNIQUE: Multidetector CT imaging of the abdomen and pelvis was performed using the standard protocol during bolus administration of intravenous contrast. Multiplanar reconstructed images and MIPs were obtained and reviewed to evaluate the vascular anatomy. RADIATION DOSE REDUCTION: This exam was performed according to the departmental dose-optimization program which includes automated exposure control, adjustment of the mA and/or kV according to patient size and/or use of iterative reconstruction technique. CONTRAST:  88mL OMNIPAQUE  IOHEXOL  350 MG/ML SOLN COMPARISON:  CT with IV contrast 05/14/2023 and 02/16/2022 FINDINGS: VASCULAR Aorta: Normal caliber aorta without aneurysm, dissection, vasculitis or significant stenosis. There are moderate patchy calcific plaques. Celiac: Patent without evidence of aneurysm, dissection, vasculitis or significant  stenosis. There are no branch occlusions. SMA: Patent without evidence of aneurysm, dissection, vasculitis or significant stenosis. Scattered mixed plaques are present but cause no more than 30% stenosis. Renals: There is a 40% smooth soft plaque stenosis in the proximal 1 cm of the left renal artery, but no flow-limiting stenosis of either renal artery. Right renal artery is widely patent. There are nonstenosing ostial calcific plaques of both renal arteries. IMA: Patent without evidence of aneurysm, dissection, vasculitis or significant stenosis. Inflow: There are moderate calcific plaques in the common iliac arteries. There are only minimal calcific plaques in the internal and external iliac arteries. Both of the internal iliac arteries demonstrate a 75% focal soft plaque origin stenosis and both are otherwise clear. There is no flow-limiting stenosis of either external iliac artery. Proximal Outflow: Bilateral common femoral and visualized portions of the  superficial and profunda femoral arteries are patent without evidence of aneurysm, dissection, vasculitis or significant stenosis. Veins: Patent.  No portal vein dilatation. Review of the MIP images confirms the above findings. NON-VASCULAR Lower chest: COPD and chronic changes both lung bases. No acute infiltrate. Bochdalek's fat hernia posterior left diaphragm. The cardiac size is normal. Hepatobiliary: The liver is mildly steatotic, without mass enhancement, measuring 16 cm length. There are calcified granulomas in the left lobe. The gallbladder wall is thicker in the fundal area than elsewhere, but this was seen on the prior studies and is probably due to adenomyomatosis or other hyperplastic wall disease. It is not notably changed. There are no calcified stones or bile duct dilatation. Pancreas: No abnormality. Spleen: No abnormality noted. Adrenals/Urinary Tract: There are Bosniak 2 subcentimeter cysts in the kidneys which are too small to characterize but unchanged. No follow-up imaging is recommended. There is no mass enhancement in the adrenal glands and kidneys. No urinary stone or obstruction. The bladder unremarkable for the degree of distention. Stomach/Bowel: Small hiatal hernia. Unremarkable contracted stomach. Normal caliber small bowel with fluid filling of the pelvic loops. Normal caliber appendix. There are thickened folds of large bowel from the splenic flexure distally consistent with proctocolitis. This exhibits mucosal enhancement but there is no visible focal vascular contrast leak into the bowel or stomach. A right inguinal wide mouth hernia again contains a short loop of the distal small bowel without upstream dilatation. No edema or fluid in the hernia sac suspicious for incarceration. Lymphatic: No adenopathy. Reproductive: Status post hysterectomy. No adnexal masses. Other: No free fluid, free air or free hemorrhage. Musculoskeletal: Osteopenia. There are bilateral partially healed  sacral ala fractures, as per the prior study the left appears more recent. There is advanced degenerative arthrosis and remodeling of the left hip joint, mild-to-moderate degenerative change right hip joint, with levoscoliosis and degenerative change of the lumbar spine. IMPRESSION: 1. No evidence of active GI bleeding. 2. Aortic and branch vessel atherosclerosis. 3. 40% soft plaque stenosis proximal left renal artery. 4. 75% soft plaque origin stenoses of both internal iliac arteries. 5. Proctocolitis from the splenic flexure distally. No evidence of bowel obstruction or perforation. 6. Right inguinal wide mouth hernia containing a short loop of the distal small bowel without upstream dilatation or incarceration. 7. Osteopenia and degenerative change. 8. Bilateral partially healed sacral ala fractures, the left appears more recent. 9. COPD and chronic changes at the lung bases. 10. Mild hepatic steatosis. 11. Stable gallbladder wall thickening in the fundal area, probably due to adenomyomatosis or other hyperplastic wall disease. Electronically Signed   By: Francis Beatriz HERO.D.  On: 05/30/2023 02:01    Medications Ordered in ED Medications  pantoprazole  (PROTONIX ) injection 40 mg (40 mg Intravenous Given 05/29/23 2345)    Followed by  pantoprazole  (PROTONIX ) injection 40 mg (has no administration in time range)    Followed by  pantoprazole  (PROTONIX ) injection 40 mg (has no administration in time range)  0.9 %  sodium chloride  infusion (Manually program via Guardrails IV Fluids) (0 mLs Intravenous Hold 05/30/23 0054)  metroNIDAZOLE  (FLAGYL ) IVPB 500 mg (500 mg Intravenous New Bag/Given 05/30/23 0254)  lactated ringers  infusion ( Intravenous New Bag/Given 05/30/23 0253)  acetaminophen  (TYLENOL ) tablet 650 mg (has no administration in time range)    Or  acetaminophen  (TYLENOL ) suppository 650 mg (has no administration in time range)  ondansetron  (ZOFRAN ) injection 4 mg (has no administration in time range)   cefTRIAXone  (ROCEPHIN ) 2 g in sodium chloride  0.9 % 100 mL IVPB (has no administration in time range)  metroNIDAZOLE  (FLAGYL ) IVPB 500 mg (has no administration in time range)  naloxone  (NARCAN ) injection 0.4 mg (has no administration in time range)  fentaNYL  (SUBLIMAZE ) injection 12.5 mcg (has no administration in time range)  haloperidol  lactate (HALDOL ) injection 5 mg (has no administration in time range)  sodium chloride  0.9 % bolus 1,000 mL (1,000 mLs Intravenous New Bag/Given 05/29/23 2328)  iohexol  (OMNIPAQUE ) 350 MG/ML injection 88 mL (88 mLs Intravenous Contrast Given 05/30/23 0051)  ceFEPIme  (MAXIPIME ) 2 g in sodium chloride  0.9 % 100 mL IVPB (2 g Intravenous New Bag/Given 05/30/23 0219)   Procedures .Critical Care  Performed by: Trine Raynell Moder, MD Authorized by: Trine Raynell Moder, MD   Critical care provider statement:    Critical care time (minutes):  44   Critical care was necessary to treat or prevent imminent or life-threatening deterioration of the following conditions:  Sepsis (acute blood loss anemia requiring transfusion)   Critical care was time spent personally by me on the following activities:  Development of treatment plan with patient or surrogate, discussions with consultants, evaluation of patient's response to treatment, examination of patient, ordering and review of laboratory studies, ordering and review of radiographic studies, ordering and performing treatments and interventions, pulse oximetry, re-evaluation of patient's condition and review of old charts   Care discussed with: admitting provider     (including critical care time) Medical Decision Making / ED Course   Medical Decision Making Amount and/or Complexity of Data Reviewed Labs: ordered. Decision-making details documented in ED Course. Radiology: ordered and independent interpretation performed. Decision-making details documented in ED Course. ECG/medicine tests: ordered and independent  interpretation performed. Decision-making details documented in ED Course.  Risk Prescription drug management. Decision regarding hospitalization.    GI bleed work up. Patient started on IV fluids.  Protonix  given.  CBC with leukocytosis.  Hemoglobin of 8.4 down from 13 couple weeks ago. BUN elevated no evidence of renal insufficiency. 1 unit of packed red blood cells ordered  Clinical Course as of 05/30/23 0327  Sun May 30, 2023  0207 CT notable for proctocolitis. With leukocytosis and elevated lactic acid, code sepsis was initiated and patient was started on antibiotics. [PC]  [id number] Spoke with Dr. Marcene from hospitalist service who will admit for further management. [PC]    Clinical Course User Index [PC] Wayne Wicklund, Raynell Moder, MD   BP stable  Repeat lactic normalized.  Final Clinical Impression(s) / ED Diagnoses Final diagnoses:  Melena  Acute blood loss anemia (ABLA)    This chart was dictated using voice recognition software.  Despite  best efforts to proofread,  errors can occur which can change the documentation meaning.    Trine Raynell Moder, MD 05/30/23 [phone]

## 2023-05-29 NOTE — ED Triage Notes (Signed)
 Pt BIB EMS from home, pt is a hospice pt. Called out for rectal bleeding unsure of how long, possibly 3 days. SBP 80's initially, now 118/70, HR 80, 97% RA. A&Ox3, unsure of year. Known R hip fracture. C/o L shoulder pain that radiates into chest. Given 750cc of NS by EMS.

## 2023-05-30 ENCOUNTER — Inpatient Hospital Stay (HOSPITAL_COMMUNITY): Payer: Medicare Other | Admitting: Anesthesiology

## 2023-05-30 ENCOUNTER — Encounter (HOSPITAL_COMMUNITY)
Admission: EM | Disposition: A | Discharge status: Hospice | Payer: Self-pay | Source: Home / Self Care | Attending: Internal Medicine

## 2023-05-30 ENCOUNTER — Encounter (HOSPITAL_COMMUNITY): Payer: Self-pay | Admitting: Internal Medicine

## 2023-05-30 ENCOUNTER — Emergency Department (HOSPITAL_COMMUNITY): Payer: Medicare Other

## 2023-05-30 DIAGNOSIS — K921 Melena: Principal | ICD-10-CM

## 2023-05-30 DIAGNOSIS — K529 Noninfective gastroenteritis and colitis, unspecified: Secondary | ICD-10-CM

## 2023-05-30 DIAGNOSIS — F0390 Unspecified dementia without behavioral disturbance: Secondary | ICD-10-CM | POA: Diagnosis present

## 2023-05-30 DIAGNOSIS — J449 Chronic obstructive pulmonary disease, unspecified: Secondary | ICD-10-CM | POA: Diagnosis present

## 2023-05-30 DIAGNOSIS — K449 Diaphragmatic hernia without obstruction or gangrene: Secondary | ICD-10-CM | POA: Diagnosis present

## 2023-05-30 DIAGNOSIS — K760 Fatty (change of) liver, not elsewhere classified: Secondary | ICD-10-CM | POA: Diagnosis present

## 2023-05-30 DIAGNOSIS — Z888 Allergy status to other drugs, medicaments and biological substances status: Secondary | ICD-10-CM | POA: Diagnosis not present

## 2023-05-30 DIAGNOSIS — D62 Acute posthemorrhagic anemia: Principal | ICD-10-CM

## 2023-05-30 DIAGNOSIS — K649 Unspecified hemorrhoids: Secondary | ICD-10-CM

## 2023-05-30 DIAGNOSIS — A419 Sepsis, unspecified organism: Secondary | ICD-10-CM

## 2023-05-30 DIAGNOSIS — K269 Duodenal ulcer, unspecified as acute or chronic, without hemorrhage or perforation: Secondary | ICD-10-CM

## 2023-05-30 DIAGNOSIS — Z515 Encounter for palliative care: Secondary | ICD-10-CM | POA: Diagnosis not present

## 2023-05-30 DIAGNOSIS — K219 Gastro-esophageal reflux disease without esophagitis: Secondary | ICD-10-CM | POA: Diagnosis present

## 2023-05-30 DIAGNOSIS — R6520 Severe sepsis without septic shock: Secondary | ICD-10-CM

## 2023-05-30 DIAGNOSIS — K6289 Other specified diseases of anus and rectum: Secondary | ICD-10-CM

## 2023-05-30 DIAGNOSIS — K222 Esophageal obstruction: Secondary | ICD-10-CM

## 2023-05-30 DIAGNOSIS — K264 Chronic or unspecified duodenal ulcer with hemorrhage: Secondary | ICD-10-CM | POA: Diagnosis present

## 2023-05-30 DIAGNOSIS — K5120 Ulcerative (chronic) proctitis without complications: Secondary | ICD-10-CM | POA: Diagnosis not present

## 2023-05-30 DIAGNOSIS — D72829 Elevated white blood cell count, unspecified: Secondary | ICD-10-CM

## 2023-05-30 DIAGNOSIS — Z8249 Family history of ischemic heart disease and other diseases of the circulatory system: Secondary | ICD-10-CM | POA: Diagnosis not present

## 2023-05-30 DIAGNOSIS — K922 Gastrointestinal hemorrhage, unspecified: Secondary | ICD-10-CM

## 2023-05-30 DIAGNOSIS — E782 Mixed hyperlipidemia: Secondary | ICD-10-CM | POA: Diagnosis present

## 2023-05-30 DIAGNOSIS — Z886 Allergy status to analgesic agent status: Secondary | ICD-10-CM | POA: Diagnosis not present

## 2023-05-30 DIAGNOSIS — E538 Deficiency of other specified B group vitamins: Secondary | ICD-10-CM | POA: Diagnosis present

## 2023-05-30 DIAGNOSIS — E872 Acidosis, unspecified: Secondary | ICD-10-CM | POA: Diagnosis present

## 2023-05-30 DIAGNOSIS — R0682 Tachypnea, not elsewhere classified: Secondary | ICD-10-CM | POA: Diagnosis present

## 2023-05-30 DIAGNOSIS — I10 Essential (primary) hypertension: Secondary | ICD-10-CM | POA: Diagnosis present

## 2023-05-30 DIAGNOSIS — E876 Hypokalemia: Secondary | ICD-10-CM | POA: Diagnosis present

## 2023-05-30 DIAGNOSIS — Z882 Allergy status to sulfonamides status: Secondary | ICD-10-CM | POA: Diagnosis not present

## 2023-05-30 DIAGNOSIS — F419 Anxiety disorder, unspecified: Secondary | ICD-10-CM | POA: Diagnosis not present

## 2023-05-30 DIAGNOSIS — Z66 Do not resuscitate: Secondary | ICD-10-CM | POA: Diagnosis present

## 2023-05-30 DIAGNOSIS — M810 Age-related osteoporosis without current pathological fracture: Secondary | ICD-10-CM | POA: Diagnosis present

## 2023-05-30 DIAGNOSIS — Z885 Allergy status to narcotic agent status: Secondary | ICD-10-CM | POA: Diagnosis not present

## 2023-05-30 HISTORY — PX: ESOPHAGOGASTRODUODENOSCOPY (EGD) WITH PROPOFOL: SHX5813

## 2023-05-30 HISTORY — PX: FLEXIBLE SIGMOIDOSCOPY: SHX5431

## 2023-05-30 HISTORY — DX: Melena: K92.1

## 2023-05-30 HISTORY — DX: Noninfective gastroenteritis and colitis, unspecified: K52.9

## 2023-05-30 HISTORY — DX: Severe sepsis without septic shock: R65.20

## 2023-05-30 HISTORY — DX: Gastrointestinal hemorrhage, unspecified: K92.2

## 2023-05-30 HISTORY — DX: Duodenal ulcer, unspecified as acute or chronic, without hemorrhage or perforation: K26.9

## 2023-05-30 HISTORY — DX: Acute posthemorrhagic anemia: D62

## 2023-05-30 HISTORY — PX: BIOPSY: SHX5522

## 2023-05-30 LAB — COMPREHENSIVE METABOLIC PANEL
ALT: 19 U/L (ref 0–44)
ALT: 19 U/L (ref 0–44)
AST: 25 U/L (ref 15–41)
AST: 24 U/L (ref 15–41)
Albumin: 2.8 g/dL — ABNORMAL LOW (ref 3.5–5.0)
Albumin: 2.7 g/dL — ABNORMAL LOW (ref 3.5–5.0)
Alkaline Phosphatase: 169 U/L — ABNORMAL HIGH (ref 38–126)
Alkaline Phosphatase: 143 U/L — ABNORMAL HIGH (ref 38–126)
Anion gap: 11 (ref 5–15)
Anion gap: 12 (ref 5–15)
BUN: 47 mg/dL — ABNORMAL HIGH (ref 8–23)
BUN: 36 mg/dL — ABNORMAL HIGH (ref 8–23)
CO2: 22 mmol/L (ref 22–32)
CO2: 19 mmol/L — ABNORMAL LOW (ref 22–32)
Calcium: 9 mg/dL (ref 8.9–10.3)
Calcium: 8.5 mg/dL — ABNORMAL LOW (ref 8.9–10.3)
Chloride: 106 mmol/L (ref 98–111)
Chloride: 106 mmol/L (ref 98–111)
Creatinine, Ser: 0.6 mg/dL (ref 0.44–1.00)
Creatinine, Ser: 0.37 mg/dL — ABNORMAL LOW (ref 0.44–1.00)
GFR, Estimated: >60 mL/min (ref >60)
GFR, Estimated: >60 mL/min (ref >60)
Glucose, Bld: 189 mg/dL — ABNORMAL HIGH (ref 70–99)
Glucose, Bld: 126 mg/dL — ABNORMAL HIGH (ref 70–99)
Potassium: 3.7 mmol/L (ref 3.5–5.1)
Potassium: 3.4 mmol/L — ABNORMAL LOW (ref 3.5–5.1)
Sodium: 139 mmol/L (ref 135–145)
Sodium: 137 mmol/L (ref 135–145)
Total Bilirubin: 0.5 mg/dL (ref 0.0–1.2)
Total Bilirubin: 2.2 mg/dL — ABNORMAL HIGH (ref 0.0–1.2)
Total Protein: 5.6 g/dL — ABNORMAL LOW (ref 6.5–8.1)
Total Protein: 5.1 g/dL — ABNORMAL LOW (ref 6.5–8.1)

## 2023-05-30 LAB — I-STAT CG4 LACTIC ACID, ED
Lactic Acid, Venous: 1.8 mmol/L (ref 0.5–1.9)
Lactic Acid, Venous: 2.6 mmol/L (ref 0.5–1.9)

## 2023-05-30 LAB — CBC WITH DIFFERENTIAL/PLATELET
Abs Immature Granulocytes: 0.14 10*3/uL — ABNORMAL HIGH (ref 0.00–0.07)
Basophils Absolute: 0 10*3/uL (ref 0.0–0.1)
Basophils Relative: 0 %
Eosinophils Absolute: 0 10*3/uL (ref 0.0–0.5)
Eosinophils Relative: 0 %
HCT: 25.9 % — ABNORMAL LOW (ref 36.0–46.0)
Hemoglobin: 9 g/dL — ABNORMAL LOW (ref 12.0–15.0)
Immature Granulocytes: 1 %
Lymphocytes Relative: 8 %
Lymphs Abs: 1.8 10*3/uL (ref 0.7–4.0)
MCH: 31.9 pg (ref 26.0–34.0)
MCHC: 34.7 g/dL (ref 30.0–36.0)
MCV: 91.8 fL (ref 80.0–100.0)
Monocytes Absolute: 1.2 10*3/uL — ABNORMAL HIGH (ref 0.1–1.0)
Monocytes Relative: 5 %
Neutro Abs: 18.5 10*3/uL — ABNORMAL HIGH (ref 1.7–7.7)
Neutrophils Relative %: 86 %
Platelets: 291 10*3/uL (ref 150–400)
RBC: 2.82 MIL/uL — ABNORMAL LOW (ref 3.87–5.11)
RDW: 15.6 % — ABNORMAL HIGH (ref 11.5–15.5)
WBC: 21.7 10*3/uL — ABNORMAL HIGH (ref 4.0–10.5)
nRBC: 0.1 % (ref 0.0–0.2)

## 2023-05-30 LAB — GLUCOSE, CAPILLARY: Glucose-Capillary: 104 mg/dL — ABNORMAL HIGH (ref 70–99)

## 2023-05-30 LAB — IRON AND TIBC
Iron: 216 ug/dL — ABNORMAL HIGH (ref 28–170)
Saturation Ratios: 84 % — ABNORMAL HIGH (ref 10.4–31.8)
TIBC: 256 ug/dL (ref 250–450)
UIBC: 40 ug/dL

## 2023-05-30 LAB — ABO/RH: ABO/RH(D): A NEG

## 2023-05-30 LAB — MAGNESIUM: Magnesium: 1.7 mg/dL (ref 1.7–2.4)

## 2023-05-30 LAB — FERRITIN: Ferritin: 426 ng/mL — ABNORMAL HIGH (ref 11–307)

## 2023-05-30 LAB — PREPARE RBC (CROSSMATCH)

## 2023-05-30 SURGERY — SIGMOIDOSCOPY, FLEXIBLE
Anesthesia: Monitor Anesthesia Care

## 2023-05-30 SURGERY — ESOPHAGOGASTRODUODENOSCOPY (EGD) WITH PROPOFOL
Anesthesia: Monitor Anesthesia Care

## 2023-05-30 MED ORDER — NALOXONE HCL 0.4 MG/ML IJ SOLN: 0.4000 mg | INTRAMUSCULAR | Status: DC | PRN

## 2023-05-30 MED ORDER — ACETAMINOPHEN 325 MG PO TABS
650.0000 mg | ORAL_TABLET | Freq: Four times a day (QID) | ORAL | Status: DC | PRN
  Administered 2023-05-31: 650 mg via ORAL
  Filled 2023-05-30 (×2): qty 2

## 2023-05-30 MED ORDER — METRONIDAZOLE 500 MG/100ML IV SOLN
500.0000 mg | Freq: Once | INTRAVENOUS | Status: AC
  Administered 2023-05-30: 500 mg via INTRAVENOUS
  Filled 2023-05-30: qty 100

## 2023-05-30 MED ORDER — METRONIDAZOLE 500 MG/100ML IV SOLN
500.0000 mg | Freq: Two times a day (BID) | INTRAVENOUS | Status: DC
Start: 2023-05-30 — End: 2023-06-01
  Administered 2023-05-30 – 2023-06-01 (×5): 500 mg via INTRAVENOUS
  Filled 2023-05-30 (×5): qty 100

## 2023-05-30 MED ORDER — SODIUM CHLORIDE 0.9 % IV SOLN
2.0000 g | Freq: Once | INTRAVENOUS | Status: AC
  Administered 2023-05-30: 2 g via INTRAVENOUS
  Filled 2023-05-30: qty 12.5

## 2023-05-30 MED ORDER — PHENYLEPHRINE HCL (PRESSORS) 10 MG/ML IV SOLN
INTRAVENOUS | Status: DC | PRN
  Administered 2023-05-30: 100 ug via INTRAVENOUS

## 2023-05-30 MED ORDER — ONDANSETRON HCL 4 MG/2ML IJ SOLN: 4.0000 mg | Freq: Four times a day (QID) | INTRAMUSCULAR | Status: DC | PRN

## 2023-05-30 MED ORDER — ACETAMINOPHEN 650 MG RE SUPP: 650.0000 mg | Freq: Four times a day (QID) | RECTAL | Status: DC | PRN

## 2023-05-30 MED ORDER — FENTANYL CITRATE PF 50 MCG/ML IJ SOSY
12.5000 ug | PREFILLED_SYRINGE | INTRAMUSCULAR | Status: DC | PRN
  Administered 2023-05-30: 12.5 ug via INTRAVENOUS
  Filled 2023-05-30: qty 1

## 2023-05-30 MED ORDER — SODIUM CHLORIDE 0.9 % IV SOLN
2.0000 g | Freq: Every day | INTRAVENOUS | Status: DC
  Administered 2023-05-30 – 2023-06-01 (×3): 2 g via INTRAVENOUS
  Filled 2023-05-30 (×3): qty 20

## 2023-05-30 MED ORDER — HALOPERIDOL LACTATE 5 MG/ML IJ SOLN
5.0000 mg | Freq: Once | INTRAMUSCULAR | Status: AC
  Administered 2023-05-30: 5 mg via INTRAVENOUS
  Filled 2023-05-30: qty 1

## 2023-05-30 MED ORDER — PROPOFOL 500 MG/50ML IV EMUL
INTRAVENOUS | Status: DC | PRN
  Administered 2023-05-30: 75 ug/kg/min via INTRAVENOUS

## 2023-05-30 MED ORDER — POTASSIUM CHLORIDE 20 MEQ PO PACK
40.0000 meq | PACK | Freq: Once | ORAL | Status: AC
  Administered 2023-05-30: 40 meq via ORAL
  Filled 2023-05-30: qty 2

## 2023-05-30 MED ORDER — SODIUM CHLORIDE 0.9 % IV SOLN: INTRAVENOUS | Status: DC

## 2023-05-30 MED ORDER — POTASSIUM CHLORIDE CRYS ER 20 MEQ PO TBCR: 40.0000 meq | EXTENDED_RELEASE_TABLET | Freq: Once | ORAL | Status: DC

## 2023-05-30 MED ORDER — SODIUM CHLORIDE 0.9% IV SOLUTION: Freq: Once | INTRAVENOUS | Status: DC

## 2023-05-30 MED ORDER — LACTATED RINGERS IV SOLN: INTRAVENOUS | Status: DC

## 2023-05-30 MED ORDER — LACTATED RINGERS IV SOLN: INTRAVENOUS | Status: DC | PRN

## 2023-05-30 MED ORDER — IOHEXOL 350 MG/ML SOLN
88.0000 mL | Freq: Once | INTRAVENOUS | Status: AC | PRN
  Administered 2023-05-30: 88 mL via INTRAVENOUS

## 2023-05-30 SURGICAL SUPPLY — 14 items

## 2023-05-30 NOTE — ED Notes (Signed)
 Pt's last oral intake 13:15 today.

## 2023-05-30 NOTE — ED Notes (Signed)
 MD Cardama made aware of blood transfusion at this time, unable to collect blood cultures. Order to start abx.

## 2023-05-30 NOTE — H&P (Addendum)
 History and Physical      Jodi Jefferson FMW:[id number] DOB: May 13, 1931 DOA: 05/29/2023; DOS: 05/30/2023  PCP: [facility]  Patient coming from: home   I have personally briefly reviewed patient's old medical records in [facility]  Chief Complaint: Hematochezia   HPI: Jodi Jefferson is a 89 y.o. female with medical history significant for  essential hypertension, who is admitted to [facility] on 05/29/2023 with  proctocolitis after presenting from home to [facility] ED complaining of  hematochezia.   The following history is provided by the patient as well as the patient's son, who is present at bedside, in addition to my discussions with the EDP and via chart review.  The patient has been experiencing new onset bright red blood over the last 3 days noting multiple such episodes each day over that timeframe.  She notes some associated  Sharp, nonradiating left lower quadrant abdominal discomfort at that time, worse with palpation.  Denies any associated nausea, vomiting, hematin emesis.  No recent dysuria or gross hematuria.  Not associate with any recent chest pain, breath, palpitations, diaphoresis, dizziness , presyncope, or syncope.  not on any blood thinners as an outpatient.  No history of alcohol abuse or recent alcohol consumption.  no known history of underlying liver pathology.  No report of recent NSAID use.  No recent reported trauma.  Most recent endoscopic evaluation was performed by Dr.**'s*of [facility] and 08/2021.   per chart review, most recent prior hemoglobin level was found to be 13 on 05/14/23.     ED Course:  Vital signs in the ED were notable for the following:   Afebrile; heart rates in the 70s to low 100s; systolic blood pressures in the low 100s to 130s, with most recent blood pressure noted to be 121/773; respiratory rate 20-23, oxygen saturation 90  Labs were notable for the following:  87 to 100% on room air.  R  CMP notable for the following: Sodium 139, potassium 3.7, recurrent 22, BUN 4047 compared to 19 on 03/13/2024, creatinine 0 point0.60 relative to 0.41 on 05/14/2023, calcium adjusted for mild type B anemia noted to be 9 9, avidin 2.8, phosphatase 169.  Otherwise, liver enzymes are within normal limits.  CBC notable for white cell count 26,700 with 89% travails, globin 8.4 with normocytic/normochromic and nonelevated DW, platelet count 363.  Initial lactic acid 2.6, with repeat value trending down to 1.8.  Fecal occult blood test was significant.  Blood cultures collected prior to initiation of IV antibiotics.  Per my interpretation, EKG in ED demonstrated the following:  No EKG performed in the ED today.  Imaging in the ED, per corresponding formal radiology read, was notable for the following:    CTA GI bleed study showed proctocolitis  from the splenic flexure distally, without any evidence of obstruction, abscess, or   Perforation, and showed no evidence of active GI bleeding.  While in the ED, the following were administered:  Protonix  80 mg IV x 1 dose, cefepime , IV Flagyl , normal saline  x 1 L bolus followed by initiation of continuous lactated Ringer's at 100150 cc/h.  Additionally, transfusion of 1 unit PRBC was initiated.  Subsequently, the patient was admitted    For further evaluation and management of presenting proctoscopy colitis associated with severe sepsis and complicated by acute blood loss anemia in the setting of associated acute lower gastrointestinal bleed.     Review of Systems: As per HPI otherwise 10 point review  of systems negative.   Past Medical History:  Diagnosis Date   Abnormal EKG 01/26/2017   Allergy    Anxiety 01/26/2017   Arthritis 01/26/2017   Benign essential hypertension 01/26/2017   Cataract    Chest pain 01/26/2017   Closed pelvic fracture (HCC) 01/26/2017   GERD (gastroesophageal reflux disease) 01/26/2017   Hyperlipemia, mixed 01/26/2017   Macular  degeneration 01/26/2017   Osteoporosis 01/26/2017    Past Surgical History:  Procedure Laterality Date   ABDOMINAL HYSTERECTOMY     APPENDECTOMY     COLONOSCOPY     many years ago    ESOPHAGOGASTRODUODENOSCOPY  2010   ESOPHAGOGASTRODUODENOSCOPY (EGD) WITH PROPOFOL  N/A 10/22/2021   Procedure: ESOPHAGOGASTRODUODENOSCOPY (EGD) WITH PROPOFOL ;  Surgeon: Avram Lupita BRAVO, MD;  Location: [facility] ENDOSCOPY;  Service: Gastroenterology;  Laterality: N/A;   REFRACTIVE SURGERY     TONSILLECTOMY      Social History:  reports that she has never smoked. She has never used smokeless tobacco. She reports that she does not drink alcohol and does not use drugs.   Allergies  Allergen Reactions   Aspirin Other (See Comments)    Stomach burns   Macrobid [Nitrofurantoin Macrocrystal] Other (See Comments)    Unknown reaction   Motrin [Ibuprofen] Other (See Comments)    GI intolerance   Polyethylene Glycol Other (See Comments)    Facial flushing Tingling    Prednisone Other (See Comments)    Unknown reaction   Red Yeast Rice [Cholestin] Other (See Comments)    Unknown reaction   Sulfa Antibiotics Other (See Comments)    Unknown reaction   Codeine Nausea Only    Family History  Problem Relation Age of Onset   Heart attack Father    Prostate cancer Paternal Grandfather    Colon cancer Neg Hx    Esophageal cancer Neg Hx    Rectal cancer Neg Hx    Stomach cancer Neg Hx     Family history reviewed and not pertinent    Prior to Admission medications   Medication Sig Start Date End Date Taking? Authorizing Provider  acetaminophen  (TYLENOL ) 325 MG tablet Take 2 tablets (650 mg total) by mouth every 6 (six) hours as needed. 05/14/23   Elnor Jayson LABOR, DO  amoxicillin (AMOXIL) 500 MG capsule Take 2,000 mg by mouth See admin instructions. Take 4 capsules (2000 mg) by mouth one hour prior to dental appointments 08/04/21   [provider]  bisacodyl  (DULCOLAX) 10 MG suppository Place 1 suppository  (10 mg total) rectally daily as needed for moderate constipation. 10/20/21   Vann, Jessica U, DO  bisacodyl  (DULCOLAX) 5 MG EC tablet Take 2 tablets (10 mg total) by mouth daily as needed for moderate constipation or mild constipation. Patient not taking: Reported on 02/16/2022 10/20/21   Vann, Jessica U, DO  Calcium & Magnesium  Carbonates (MYLANTA PO) Take 10 mLs by mouth daily as needed (constipation).    [provider]  calcium carbonate (TUMS EX) 750 MG chewable tablet Chew 750 mg by mouth daily as needed for heartburn.    [provider]  carboxymethylcellulose (REFRESH PLUS) 0.5 % SOLN Place 1 drop into both eyes 4 (four) times daily as needed (dry eyes).    [provider]  cyanocobalamin (VITAMIN B12) 1000 MCG tablet Take 1,000 mcg by mouth daily.    [provider]  denosumab  (PROLIA ) 60 MG/ML SOSY injection Inject 60 mg into the skin every 6 (six) months.    [provider]  Famotidine  (PEPCID  PO) Take 1 tablet by mouth daily as needed (acid reflux).    [provider]  lisinopril  (ZESTRIL ) 10 MG tablet Take 10 mg by mouth every evening.    [provider]  Magnesium  250 MG TABS Take 250 mg by mouth daily.    [provider]  Multiple Vitamins-Minerals (CENTRUM SILVER PO) Take 1 tablet by mouth daily.    [provider]  Multiple Vitamins-Minerals (ZINC PO) Take 1 tablet by mouth daily.    [provider]  Omega-3 1000 MG CAPS Take 2,000 mg by mouth daily.    [provider]  polyethylene glycol (MIRALAX / GLYCOLAX) 17 g packet Take 17 g by mouth 2 (two) times daily.    [provider]  senna (SENOKOT) 8.6 MG TABS tablet Take 2 tablets (17.2 mg total) by mouth daily. Patient not taking: Reported on 02/16/2022 10/20/21   Juvenal Harlene PENNER, DO     Objective    Physical Exam: Vitals:   05/30/23 0100 05/30/23 0109 05/30/23 0214 05/30/23 0216  BP: 133/80 134/74 121/73   Pulse: 78 94  (!) 101 94  Resp: 20 20 (!) 23 (!) 22  Temp:  97.7 F (36.5 C)    TempSrc:  Oral    SpO2: 93%  100% 100%  Weight:      Height:        General: appears to be stated age; alert Skin: warm, dry, no rash Head:  AT/[place] Mouth:  Oral mucosa membranes appear dry, normal dentition Neck: supple; trachea midline Heart:  RRR; did not appreciate any M/R/G Lungs: CTAB, did not appreciate any wheezes, rales, or rhonchi Abdomen: + BS; soft, ND, mild tenderness palpation over the left lower quadrant, in the absence of any associated guarding, rigidity, or rebound tenderness Vascular: 2+ pedal pulses b/l; 2+ radial pulses b/l Extremities: no peripheral edema, no muscle wasting Neuro: strength and sensation intact in upper and lower extremities b/l    Labs on Admission: I have personally reviewed following labs and imaging studies  CBC: Recent Labs  Lab 05/29/23 2320 05/29/23 2341  WBC 26.7*  --   NEUTROABS 23.9*  --   HGB 8.4* 8.8*  HCT 25.7* 26.0*  MCV 98.1  --   PLT 363  --    Basic Metabolic Panel: Recent Labs  Lab 05/29/23 2320 05/29/23 2341  NA 139 138  K 3.7 3.7  CL 106 106  CO2 22  --   GLUCOSE 189* 187*  BUN 47* 40*  CREATININE 0.60 0.40*  CALCIUM 9.0  --    GFR: Estimated Creatinine Clearance: 30.8 mL/min (A) (by C-G formula based on SCr of 0.4 mg/dL (L)). Liver Function Tests: Recent Labs  Lab 05/29/23 2320  AST 25  ALT 19  ALKPHOS 169*  BILITOT 0.5  PROT 5.6*  ALBUMIN 2.8*   No results for input(s): LIPASE, AMYLASE in the last 168 hours. No results for input(s): AMMONIA in the last 168 hours. Coagulation Profile: Recent Labs  Lab 05/29/23 2320  INR 1.0   Cardiac Enzymes: No results for input(s): CKTOTAL, CKMB, CKMBINDEX, TROPONINI in the last 168 hours. BNP (last 3 results) No results for input(s): PROBNP in the last 8760 hours. HbA1C: No results for input(s): HGBA1C in the last 72 hours. CBG: No results for input(s): GLUCAP  in the last 168 hours. Lipid Profile: No results for input(s): CHOL, HDL, LDLCALC, TRIG, CHOLHDL, LDLDIRECT in the last 72 hours. Thyroid  Function Tests: No results for input(s):  TSH, T4TOTAL, FREET4, T3FREE, THYROIDAB in the last 72 hours. Anemia Panel: No results for input(s): VITAMINB12, FOLATE, FERRITIN, TIBC, IRON, RETICCTPCT in the last 72 hours. Urine analysis:    Component Value Date/Time   COLORURINE YELLOW 05/08/2023 2039   APPEARANCEUR CLOUDY (A) 05/08/2023 2039   LABSPEC 1.023 05/08/2023 2039   PHURINE 6.0 05/08/2023 2039   GLUCOSEU NEGATIVE 05/08/2023 2039   HGBUR NEGATIVE 05/08/2023 2039   BILIRUBINUR NEGATIVE 05/08/2023 2039   KETONESUR NEGATIVE 05/08/2023 2039   PROTEINUR 30 (A) 05/08/2023 2039   NITRITE NEGATIVE 05/08/2023 2039   LEUKOCYTESUR NEGATIVE 05/08/2023 2039    Radiological Exams on Admission: CT ANGIO GI BLEED Result Date: 05/30/2023 CLINICAL DATA:  Lower GI/rectal bleed. EXAM: CTA ABDOMEN AND PELVIS WITHOUT AND WITH CONTRAST TECHNIQUE: Multidetector CT imaging of the abdomen and pelvis was performed using the standard protocol during bolus administration of intravenous contrast. Multiplanar reconstructed images and MIPs were obtained and reviewed to evaluate the vascular anatomy. RADIATION DOSE REDUCTION: This exam was performed according to the departmental dose-optimization program which includes automated exposure control, adjustment of the mA and/or kV according to patient size and/or use of iterative reconstruction technique. CONTRAST:  88mL OMNIPAQUE  IOHEXOL  350 MG/ML SOLN COMPARISON:  CT with IV contrast 05/14/2023 and 02/16/2022 FINDINGS: VASCULAR Aorta: Normal caliber aorta without aneurysm, dissection, vasculitis or significant stenosis. There are moderate patchy calcific plaques. Celiac: Patent without evidence of aneurysm, dissection, vasculitis or significant stenosis. There are no branch occlusions. SMA: Patent  without evidence of aneurysm, dissection, vasculitis or significant stenosis. Scattered mixed plaques are present but cause no more than 30% stenosis. Renals: There is a 40% smooth soft plaque stenosis in the proximal 1 cm of the left renal artery, but no flow-limiting stenosis of either renal artery. Right renal artery is widely patent. There are nonstenosing ostial calcific plaques of both renal arteries. IMA: Patent without evidence of aneurysm, dissection, vasculitis or significant stenosis. Inflow: There are moderate calcific plaques in the common iliac arteries. There are only minimal calcific plaques in the internal and external iliac arteries. Both of the internal iliac arteries demonstrate a 75% focal soft plaque origin stenosis and both are otherwise clear. There is no flow-limiting stenosis of either external iliac artery. Proximal Outflow: Bilateral common femoral and visualized portions of the superficial and profunda femoral arteries are patent without evidence of aneurysm, dissection, vasculitis or significant stenosis. Veins: Patent.  No portal vein dilatation. Review of the MIP images confirms the above findings. NON-VASCULAR Lower chest: COPD and chronic changes both lung bases. No acute infiltrate. Bochdalek's fat hernia posterior left diaphragm. The cardiac size is normal. Hepatobiliary: The liver is mildly steatotic, without mass enhancement, measuring 16 cm length. There are calcified granulomas in the left lobe. The gallbladder wall is thicker in the fundal area than elsewhere, but this was seen on the prior studies and is probably due to adenomyomatosis or other hyperplastic wall disease. It is not notably changed. There are no calcified stones or bile duct dilatation. Pancreas: No abnormality. Spleen: No abnormality noted. Adrenals/Urinary Tract: There are Bosniak 2 subcentimeter cysts in the kidneys which are too small to characterize but unchanged. No follow-up imaging is recommended.  There is no mass enhancement in the adrenal glands and kidneys. No urinary stone or obstruction. The bladder unremarkable for the degree of distention. Stomach/Bowel: Small hiatal hernia. Unremarkable contracted stomach. Normal caliber small bowel with fluid filling of the pelvic loops. Normal caliber appendix. There are thickened folds of large bowel from  the splenic flexure distally consistent with proctocolitis. This exhibits mucosal enhancement but there is no visible focal vascular contrast leak into the bowel or stomach. A right inguinal wide mouth hernia again contains a short loop of the distal small bowel without upstream dilatation. No edema or fluid in the hernia sac suspicious for incarceration. Lymphatic: No adenopathy. Reproductive: Status post hysterectomy. No adnexal masses. Other: No free fluid, free air or free hemorrhage. Musculoskeletal: Osteopenia. There are bilateral partially healed sacral ala fractures, as per the prior study the left appears more recent. There is advanced degenerative arthrosis and remodeling of the left hip joint, mild-to-moderate degenerative change right hip joint, with levoscoliosis and degenerative change of the lumbar spine. IMPRESSION: 1. No evidence of active GI bleeding. 2. Aortic and branch vessel atherosclerosis. 3. 40% soft plaque stenosis proximal left renal artery. 4. 75% soft plaque origin stenoses of both internal iliac arteries. 5. Proctocolitis from the splenic flexure distally. No evidence of bowel obstruction or perforation. 6. Right inguinal wide mouth hernia containing a short loop of the distal small bowel without upstream dilatation or incarceration. 7. Osteopenia and degenerative change. 8. Bilateral partially healed sacral ala fractures, the left appears more recent. 9. COPD and chronic changes at the lung bases. 10. Mild hepatic steatosis. 11. Stable gallbladder wall thickening in the fundal area, probably due to adenomyomatosis or other  hyperplastic wall disease. Electronically Signed   By: Francis Quam M.D.   On: 05/30/2023 02:01      Assessment/Plan   Principal Problem:   Proctocolitis Active Problems:   Essential hypertension   Leukocytosis   Severe sepsis (HCC)   Acute blood loss anemia   Acute lower gastrointestinal bleeding     #) Severe sepsis due to proctocolitis: Diagnosis on the basis of presenting 3 days of left-sided abdominal discomfort associated with new onset hematochezia, with CTA GI bleed study showing evidence of proctocolitis from the splenic flexure distally, and distribution consistent with the patient's abdominal discomfort, without any corresponding radiographic evidence to suggest bowel obstruction, abscess, or perforation.   SIRS criteria met via leukocytosis with neutrophilic predominance, as well as tachycardia, tachypnea. Lactic acid level: Initially noted to be elevated at 2.6, with repeat value trending down to 1.8. Of note, given the associated presence of suspected end organ damage in the form of concominant presenting elevated lactic acid, criteria are met for pt's sepsis to be considered severe in nature. However, in the absence of lactic acid level that is greater than or equal to 4.0, and in the absence of any associated hypotension refractory to IVF's, there are no indications for administration of a 30 mL/kg IVF bolus at this time.   Additional ED work-up/management notable for: Collection of blood cultures followed by initiation of cefepime  and IV Flagyl .  Will continue cephalosporin and IV Flagyl , but will de-escalate cefepime  to Rocephin  at this time.  Her proctocolitis appears to be the source of her presenting hematochezia leading to acute blood loss anemia, as further detailed below.  No e/o additional infectious process at this time, but will also check urinalysis in the context of her lower abdominal discomfort.  Plan: CBC w/ diff and CMP in AM.  Follow for results of  blood cx's x 2. Abx: Rocephin , IV Flagyl , as above.  Reduce rate of continuous lactated Ringer's from 150 cc/h to 100 cc/h.  Further evaluation and management of associated acute lower gastrointestinal bleed with resultant acute blood loss anemia, as further detailed below.  Check urinalysis.  Prn acetaminophen  for fever.  Monitor on telemetry.  Prn IV fentanyl .  As needed IV Zofran .  Will Keep n.p.o. for now.                   #) Acute blood loss anemia in the setting of acute lower gastrointestinal bleed: In the setting of 3 days of hematochezia, which, in the context of her left-sided abdominal discomfort, appears to be the basis of proctocolitis, as identified on today CTA GI bleeding study.  She is otherwise without presenting abdominal discomfort, including no epigastric discomfort, and CTA GI bleed study shows no evidence of active GI bleeding, including no evidence of active GI bleeding from upper GI distribution.  While her hematochezia, left lower quadrant abdominal discomfort, presence of hemodynamic stability, and finding of proctocolitis on CTA GI bleed study, all appear consistent with acute GI bleed of lower distribution on the basis of her proctocolitis, the interval elevation in her BUN is noted.  While this may be representative of acute prerenal azotemia stemming from dehydration from her recent hematochezia from proctocolitis and decline in oral intake over the last few days, we will continue to closely monitor interval BUN trend, and continue existing IV Protonix  for now.  Of note, no known history of chronic underlying liver pathology to warrant consideration for initiation of SBP prophylaxis.  Additionally, presentation does not appear consistent with a variceal bleed to warrant consideration for initiation of octreotide drip.  In the setting of the above, her presenting hemoglobin is found to be 8.4 relative to baseline hemoglobin around 13, with most recent prior  hemoglobin data point it to be 13 on 05/14/2023.  In setting of her hematochezia over the last 3 days, CBC reflects normocytic/normochromic findings, consistent with an acute timeframe for her anemia.  Not on any blood thinners as an outpatient.  She has been normotensive throughout her ED course, and aside from her presenting hematochezia with left-sided abdominal discomfort appears asymptomatic from her acute blood loss anemia at this time.  EDP is ordered transfusion 1 unit PRBC.   Plan: Further evaluation management of presenting proctocolitis, including IV antibiotics, as above.  Refrain from pharmacologic DVT prophylaxis for now.  SCDs.  Continue with existing order for transfusion of 1 unit PRBC.  CBC in the morning.  Acute 4-hour H&H's have been ordered through 1300 on 05/30/2023.  Repeat CMP in the morning, with intention to interval trend in BUN.  Add on iron studies.  Keep n.p.o. for now.  For now, we will continue IV Protonix .  Should there be a subsequent evidence to suggest concomitant upper GI bleed, would be considered GI involvement at that time.  Hold home lisinopril  for now. EKG to establish baseline in the setting of acute blood loss anemia.                       #) Essential Hypertension: documented h/o such, with outpatient antihypertensive regimen including lisinopril .  SBP's in the ED today: Low 100s to 130s mmHg.   Plan: Close monitoring of subsequent BP via routine VS. in the setting of worsening acute lower gastrointestinal bleed with acute blood loss anemia, will hold home lisinopril  for now.             DVT prophylaxis: SCD's   Code Status: Full code Family Communication: Case was discussed with the patient's son, who is present at bedside Disposition Plan: Per Rounding Team Consults called: none;  Admission status: Inpatient  I SPENT GREATER THAN 75  MINUTES IN CLINICAL CARE TIME/MEDICAL DECISION-MAKING IN COMPLETING THIS ADMISSION.       Caroly Purewal B Evolett Somarriba DO [facility]  From 7PM - 7AM   05/30/2023, 2:44 AM

## 2023-05-30 NOTE — Sepsis Progress Note (Signed)
 Elink following code sepsis

## 2023-05-30 NOTE — Consult Note (Signed)
 Consultation Note   Referring Provider:  [facility] PCP: [facility] Gastroenterologist: Lynnie Bring, MD        Reason for Consultation: Hematochezia  DOA: 05/29/2023         Hospital Day: 2   ASSESSMENT    Brief Narrative:  89 y.o. year old female with a history of GERD /small hiatal hernia, gastric ulcers, B12 deficiency, pelvic fractures , COPD , right inguinal hernia containing distal small bowel , surgeries include appendectomy and abdominal hysterectomy.  Patient being admitted for rectal bleeding /proctocolitis on CT scan. She is frail, has intermittent confusion and has been on Hospice care  Hematochezia with hypotension and acute blood loss anemia / Proctocolitis on CT angio. CTA negative for active bleeding. Despite findings of proctocolitis on CT angio, there is a high likelihood that she had a brisk upper GI bleed with hypotension, black stool on DRE, elevated BUN.   Proctocolitis on CTA  CTA suggests proctocolitis from splenic flexure distally. Significance unclear at this point but doesn't seem to fit clinically   Acute blood loss anemia  Hemoglobin 8.4, down from 13 late January.  She received a unit of blood this with improvement in hemoglobin to 9  Severe sepsis ( elevated WBC, elevated lactic acid, tachycardia , tachypnea.  Secondary to colitis? Other etiologies?   Right inguinal wide mouth hernia containing a short loop of the distal small bowel without upstream dilatation or incarceration on CT this admission.   Principal Problem:   Proctocolitis Active Problems:   Essential hypertension   Leukocytosis   Severe sepsis (HCC)   Acute blood loss anemia (ABLA)   Acute lower gastrointestinal bleeding      PLAN:   --Getting Rocephin  and Flagyl  -- I spoke to the son who was in the room.  Discussed our concern for upper GI bleed.  The risks and benefits of EGD with possible biopsies  were discussed and he would like to proceed --Continue IV Protonix   --Monitor H/H --NPO  HPI   Patient was last evaluated by us  during hospital admission in July 2023.  At that time we evaluated her for coffee-ground emesis in the setting of a partial SBO  Interval GI history:  Patient brought to ED yesterday by EMS for evaluation of rectal bleeding.  Initially she was hypotensive.  In the ED she had a large loose dark red bowel movement. Son in room and provides history due to patient's confusion.  Last week son saw blood in patient's underpants.  He was not sure from where the blood came.  This resolved but then last night she passed a large amount of blood from her rectum.  The son also describes what sounds like near syncopal episode.  He describes black stool with red blood.  Yesterday she had an episode of purple colored emesis.  He has given her 2-3 doses of an NSAID over the last week but generally she does not take NSAIDs at home.  She has recently complained of pain near the left lower rib cage radiating downward to the left lower quadrant .  Patient is in hospice care.  She and her son live together.   ED evaluation: WBC 26.7, hemoglobin 8.4, MCV 98 ,  ferritin 426 , TIBC 256 , iron saturation elevated at 84% , platelets 363 , FOBT positive, UA not suspicious , INR 1.0, BUN 47, creatinine 0.6, ionized calcium low at 1.13, alk phos 169, normal liver transaminases, bilirubin 0.5, lactic acid 2.6  CT angio 1. No evidence of active GI bleeding. 2. Aortic and branch vessel atherosclerosis. 3. 40% soft plaque stenosis proximal left renal artery. 4. 75% soft plaque origin stenoses of both internal iliac arteries. 5. Proctocolitis from the splenic flexure distally. No evidence of bowel obstruction or perforation. 6. Right inguinal wide mouth hernia containing a short loop of the distal small bowel without upstream dilatation or incarceration. 7. Osteopenia and degenerative change. 8.  Bilateral partially healed sacral ala fractures, the left appears more recent. 9. COPD and chronic changes at the lung bases. 10. Mild hepatic steatosis. 11. Stable gallbladder wall thickening in the fundal area, probably due to adenomyomatosis or other hyperplastic wall disease.  Updated history:  Today her white count shows improvement to 21,000.  Hemoglobin up to 9 post 1 unit of blood.  Alk phos down to 143, bilirubin has increased from normal to 2.2, lactic acid down to 1.8    Previous GI Evaluations   EGD June 2023 -- Erythema at the GE junction.  Small hiatal hernia.  Exam otherwise normal  Recent Labs    05/29/23 2320 05/29/23 2341 05/30/23 0529  WBC 26.7*  --  21.7*  HGB 8.4* 8.8* 9.0*  HCT 25.7* 26.0* 25.9*  PLT 363  --  291   Recent Labs    05/29/23 2320 05/29/23 2341 05/30/23 0529  NA 139 138 137  K 3.7 3.7 3.4*  CL 106 106 106  CO2 22  --  19*  GLUCOSE 189* 187* 126*  BUN 47* 40* 36*  CREATININE 0.60 0.40* 0.37*  CALCIUM 9.0  --  8.5*   Recent Labs    05/30/23 0529  PROT 5.1*  ALBUMIN 2.7*  AST 24  ALT 19  ALKPHOS 143*  BILITOT 2.2*   No results for input(s): HEPBSAG, HCVAB, HEPAIGM, HEPBIGM in the last 72 hours. Recent Labs    05/29/23 2320  LABPROT 13.5  INR 1.0     Past Medical History:  Diagnosis Date   Abnormal EKG 01/26/2017   Allergy    Anxiety 01/26/2017   Arthritis 01/26/2017   Benign essential hypertension 01/26/2017   Cataract    Chest pain 01/26/2017   Closed pelvic fracture (HCC) 01/26/2017   GERD (gastroesophageal reflux disease) 01/26/2017   Hyperlipemia, mixed 01/26/2017   Macular degeneration 01/26/2017   Osteoporosis 01/26/2017    Past Surgical History:  Procedure Laterality Date   ABDOMINAL HYSTERECTOMY     APPENDECTOMY     COLONOSCOPY     many years ago    ESOPHAGOGASTRODUODENOSCOPY  2010   ESOPHAGOGASTRODUODENOSCOPY (EGD) WITH PROPOFOL  N/A 10/22/2021   Procedure: ESOPHAGOGASTRODUODENOSCOPY (EGD) WITH  PROPOFOL ;  Surgeon: Avram Lupita BRAVO, MD;  Location: [facility] ENDOSCOPY;  Service: Gastroenterology;  Laterality: N/A;   REFRACTIVE SURGERY     TONSILLECTOMY      Family History  Problem Relation Age of Onset   Heart attack Father    Prostate cancer Paternal Grandfather    Colon cancer Neg Hx    Esophageal cancer Neg Hx    Rectal cancer Neg Hx    Stomach cancer Neg Hx     Prior to Admission medications   Medication Sig Start Date End Date Taking? Authorizing Provider  acetaminophen  (TYLENOL ) 325 MG  tablet Take 2 tablets (650 mg total) by mouth every 6 (six) hours as needed. 05/14/23   Elnor Jayson LABOR, DO  amoxicillin (AMOXIL) 500 MG capsule Take 2,000 mg by mouth See admin instructions. Take 4 capsules (2000 mg) by mouth one hour prior to dental appointments 08/04/21   [provider]  bisacodyl  (DULCOLAX) 10 MG suppository Place 1 suppository (10 mg total) rectally daily as needed for moderate constipation. 10/20/21   Vann, Jessica U, DO  bisacodyl  (DULCOLAX) 5 MG EC tablet Take 2 tablets (10 mg total) by mouth daily as needed for moderate constipation or mild constipation. Patient not taking: Reported on 02/16/2022 10/20/21   Vann, Jessica U, DO  Calcium & Magnesium  Carbonates (MYLANTA PO) Take 10 mLs by mouth daily as needed (constipation).    [provider]  calcium carbonate (TUMS EX) 750 MG chewable tablet Chew 750 mg by mouth daily as needed for heartburn.    [provider]  carboxymethylcellulose (REFRESH PLUS) 0.5 % SOLN Place 1 drop into both eyes 4 (four) times daily as needed (dry eyes).    [provider]  cyanocobalamin (VITAMIN B12) 1000 MCG tablet Take 1,000 mcg by mouth daily.    [provider]  denosumab  (PROLIA ) 60 MG/ML SOSY injection Inject 60 mg into the skin every 6 (six) months.    [provider]  Famotidine  (PEPCID  PO) Take 1 tablet by mouth daily as needed (acid reflux).    [provider]  lisinopril  (ZESTRIL )  10 MG tablet Take 10 mg by mouth every evening.    [provider]  Magnesium  250 MG TABS Take 250 mg by mouth daily.    [provider]  Multiple Vitamins-Minerals (CENTRUM SILVER PO) Take 1 tablet by mouth daily.    [provider]  Multiple Vitamins-Minerals (ZINC PO) Take 1 tablet by mouth daily.    [provider]  Omega-3 1000 MG CAPS Take 2,000 mg by mouth daily.    [provider]  polyethylene glycol (MIRALAX / GLYCOLAX) 17 g packet Take 17 g by mouth 2 (two) times daily.    [provider]  senna (SENOKOT) 8.6 MG TABS tablet Take 2 tablets (17.2 mg total) by mouth daily. Patient not taking: Reported on 02/16/2022 10/20/21   Vann, Jessica U, DO    Current Facility-Administered Medications  Medication Dose Route Frequency Provider Last Rate Last Admin   0.9 %  sodium chloride  infusion (Manually program via Guardrails IV Fluids)   Intravenous Once Cardama, Raynell Moder, MD   Held at 05/30/23 0054   0.9 %  sodium chloride  infusion   Intravenous Continuous Cheryle Page, MD 75 mL/hr at 05/30/23 1039 New Bag at 05/30/23 1039   acetaminophen  (TYLENOL ) tablet 650 mg  650 mg Oral Q6H PRN Howerter, Justin B, DO       Or   acetaminophen  (TYLENOL ) suppository 650 mg  650 mg Rectal Q6H PRN Howerter, Justin B, DO       cefTRIAXone  (ROCEPHIN ) 2 g in sodium chloride  0.9 % 100 mL IVPB  2 g Intravenous Daily Howerter, Justin B, DO   Stopped at 05/30/23 1001   fentaNYL  (SUBLIMAZE ) injection 12.5 mcg  12.5 mcg Intravenous Q2H PRN Howerter, Justin B, DO   12.5 mcg at 05/30/23 0422   metroNIDAZOLE  (FLAGYL ) IVPB 500 mg  500 mg Intravenous BID Howerter, Justin B, DO 100 mL/hr at 05/30/23 1151 500 mg at 05/30/23 1151   naloxone  (NARCAN ) injection 0.4 mg  0.4 mg Intravenous  PRN Howerter, Justin B, DO       ondansetron  (ZOFRAN ) injection 4 mg  4 mg Intravenous Q6H PRN Howerter, Justin B, DO       pantoprazole  (PROTONIX ) injection 40 mg  40 mg Intravenous Q8H  Cardama, Raynell Moder, MD   40 mg at 05/30/23 9082   Followed by   NOREEN ON 06/02/2023] pantoprazole  (PROTONIX ) injection 40 mg  40 mg Intravenous Q12H Cardama, Raynell Moder, MD       Current Outpatient Medications  Medication Sig Dispense Refill   acetaminophen  (TYLENOL ) 325 MG tablet Take 2 tablets (650 mg total) by mouth every 6 (six) hours as needed. 36 tablet 0   amoxicillin (AMOXIL) 500 MG capsule Take 2,000 mg by mouth See admin instructions. Take 4 capsules (2000 mg) by mouth one hour prior to dental appointments     bisacodyl  (DULCOLAX) 10 MG suppository Place 1 suppository (10 mg total) rectally daily as needed for moderate constipation. 12 suppository 0   bisacodyl  (DULCOLAX) 5 MG EC tablet Take 2 tablets (10 mg total) by mouth daily as needed for moderate constipation or mild constipation. (Patient not taking: Reported on 02/16/2022) 30 tablet 0   Calcium & Magnesium  Carbonates (MYLANTA PO) Take 10 mLs by mouth daily as needed (constipation).     calcium carbonate (TUMS EX) 750 MG chewable tablet Chew 750 mg by mouth daily as needed for heartburn.     carboxymethylcellulose (REFRESH PLUS) 0.5 % SOLN Place 1 drop into both eyes 4 (four) times daily as needed (dry eyes).     cyanocobalamin (VITAMIN B12) 1000 MCG tablet Take 1,000 mcg by mouth daily.     denosumab  (PROLIA ) 60 MG/ML SOSY injection Inject 60 mg into the skin every 6 (six) months.     Famotidine  (PEPCID  PO) Take 1 tablet by mouth daily as needed (acid reflux).     lisinopril  (ZESTRIL ) 10 MG tablet Take 10 mg by mouth every evening.     Magnesium  250 MG TABS Take 250 mg by mouth daily.     Multiple Vitamins-Minerals (CENTRUM SILVER PO) Take 1 tablet by mouth daily.     Multiple Vitamins-Minerals (ZINC PO) Take 1 tablet by mouth daily.     Omega-3 1000 MG CAPS Take 2,000 mg by mouth daily.     polyethylene glycol (MIRALAX / GLYCOLAX) 17 g packet Take 17 g by mouth 2 (two) times daily.     senna (SENOKOT) 8.6 MG TABS  tablet Take 2 tablets (17.2 mg total) by mouth daily. (Patient not taking: Reported on 02/16/2022) 120 tablet 0    Allergies as of 05/29/2023 - Review Complete 05/29/2023  Allergen Reaction Noted   Aspirin Other (See Comments) 01/26/2017   Macrobid [nitrofurantoin macrocrystal] Other (See Comments) 01/26/2017   Motrin [ibuprofen] Other (See Comments) 05/23/2020   Polyethylene glycol Other (See Comments) 01/26/2017   Prednisone Other (See Comments) 01/26/2017   Red yeast rice [cholestin] Other (See Comments) 01/26/2017   Sulfa antibiotics Other (See Comments) 01/26/2017   Codeine Nausea Only 11/10/2016    Social History   Socioeconomic History   Marital status: Married    Spouse name: Not on file   Number of children: Not on file   Years of education: Not on file   Highest education level: Not on file  Occupational History   Not on file  Tobacco Use   Smoking status: Never   Smokeless tobacco: Never  Vaping Use   Vaping status: Never Used  Substance and Sexual Activity  Alcohol use: No   Drug use: No   Sexual activity: Not on file  Other Topics Concern   Not on file  Social History Narrative   Not on file   Social Drivers of Health   Financial Resource Strain: Not on file  Food Insecurity: Not on file  Transportation Needs: Not on file  Physical Activity: Not on file  Stress: Not on file  Social Connections: Not on file  Intimate Partner Violence: Not on file     Code Status   Code Status: Full Code  Review of Systems: Unable to obtain secondary to patient's confusion  Physical Exam: Vital signs in last 24 hours: Temp:  [97.7 F (36.5 C)-98.1 F (36.7 C)] 97.9 F (36.6 C) (02/09 0930) Pulse Rate:  [78-110] 86 (02/09 0929) Resp:  [13-26] 22 (02/09 0929) BP: (93-134)/(44-93) 93/61 (02/09 0929) SpO2:  [93 %-100 %] 100 % (02/09 0929) Weight:  [44 kg] 44 kg (02/08 2311)    General:  Pleasant frail female in NAD Psych:  Cooperative.  Eyes: Pupils  equal Ears:  Normal auditory acuity Nose: No deformity, discharge or lesions Mouth: Tongue is very dry Neck:  Supple, no masses felt Lungs:  Clear to auscultation.  Heart:  Regular rate, regular rhythm.  Abdomen:  Soft, nondistended, nontender, active bowel sounds, no masses felt Rectal : Soft black stool in vault deferred Msk: Symmetrical without gross deformities.  Neurologic:  Alert, not oriented to time or place Extremities : No edema Skin:  Intact without significant lesions.    Intake/Output from previous day: 02/08 0701 - 02/09 0700 In: 515.4 [Blood:315; IV Piggyback:200.4] Out: -  Intake/Output this shift:  Total I/O In: 99.6 [IV Piggyback:99.6] Out: -    Vina Dasen, NP-C   05/30/2023, 12:09 PM

## 2023-05-30 NOTE — Op Note (Signed)
 [facility] Patient Name: Jodi Jefferson Procedure Date : 05/30/2023 MRN: [id number] Attending MD: Inocente Hausen , MD, [id number] Date of Birth: 12-18-1931 CSN: [id number] Age: 89 Admit Type: Inpatient Procedure:                Upper GI endoscopy Indications:              Epigastric abdominal pain, Acute post hemorrhagic                            anemia, Melena Providers:                Inocente Hausen, MD, Collene Edu, RN, Farris Southgate,                            Technician Referring MD:              Medicines:                Monitored Anesthesia Care Complications:            No immediate complications. Estimated blood loss:                            Minimal. Estimated Blood Loss:     Estimated blood loss was minimal. Procedure:                Pre-Anesthesia Assessment:                           - Prior to the procedure, a History and Physical                            was performed, and patient medications and                            allergies were reviewed. The patient's tolerance of                            previous anesthesia was also reviewed. The risks                            and benefits of the procedure and the sedation                            options and risks were discussed with the patient.                            All questions were answered, and informed consent                            was obtained. Prior Anticoagulants: The patient has                            taken no anticoagulant or antiplatelet agents. ASA                            Grade Assessment: III -  A patient with severe                            systemic disease. After reviewing the risks and                            benefits, the patient was deemed in satisfactory                            condition to undergo the procedure.                           After obtaining informed consent, the endoscope was                            passed under direct vision. Throughout the                             procedure, the patient's blood pressure, pulse, and                            oxygen saturations were monitored continuously. The                            [id number]-[id number] ([id number]) Olympus endoscope was introduced                            through the mouth, and advanced to the second part                            of duodenum. The upper GI endoscopy was                            accomplished without difficulty. The patient                            tolerated the procedure well. Scope In: Scope Out: Findings:      The examined esophagus was normal.      A widely patent and non-obstructing Schatzki ring was found in the lower       third of the esophagus.      The gastric body, gastric antrum, cardia (on retroflexion) and gastric       fundus (on retroflexion) were normal. Biopsies were taken with a cold       forceps for Helicobacter pylori testing.      A small hiatal hernia was present.      Three non-bleeding cratered duodenal ulcers with no stigmata of bleeding       were found in the duodenal bulb. The largest lesion was 8 mm in largest       dimension.      The second portion of the duodenum was normal.      There was no evidence of active bleeding or stigmata of recent bleeding       in the examined portion of the upper GI tract during today's exam. Impression:               -  Normal esophagus.                           - Widely patent and non-obstructing Schatzki ring.                           - Normal gastric body, antrum, cardia and gastric                            fundus. Biopsied.                           - Small hiatal hernia.                           - Non-bleeding duodenal ulcers with no stigmata of                            bleeding.                           - Normal second portion of the duodenum. Recommendation:           - Return patient to hospital ward for ongoing care.                           - Continue Protonix  40 mg IV twice  daily                           - Monitor serial H&H and hemodynamics                           - Await pathology results -if positive for H.                            pylori we will treat with quadruple therapy                           - Avoid NSAIDs. Procedure Code(s):        --- Professional ---                           [phone], Esophagogastroduodenoscopy, flexible,                            transoral; with biopsy, single or multiple Diagnosis Code(s):        --- Professional ---                           K22.2, Esophageal obstruction                           K44.9, Diaphragmatic hernia without obstruction or                            gangrene  K26.9, Duodenal ulcer, unspecified as acute or                            chronic, without hemorrhage or perforation                           R10.13, Epigastric pain                           D62, Acute posthemorrhagic anemia                           K92.1, Melena (includes Hematochezia) CPT copyright 2022 [employer] Association. All rights reserved. The codes documented in this report are preliminary and upon coder review may  be revised to meet current compliance requirements. Inocente Hausen, MD 05/30/2023 4:21:56 PM This report has been signed electronically. Number of Addenda: 0

## 2023-05-30 NOTE — Transfer of Care (Signed)
 Immediate Anesthesia Transfer of Care Note  Patient: Jodi Jefferson  Procedure(s) Performed: ESOPHAGOGASTRODUODENOSCOPY (EGD) WITH PROPOFOL  FLEXIBLE SIGMOIDOSCOPY BIOPSY  Patient Location: PACU  Anesthesia Type:General  Level of Consciousness: drowsy  Airway & Oxygen Therapy: Patient Spontanous Breathing and Patient connected to nasal cannula oxygen  Post-op Assessment: Report given to RN and Post -op Vital signs reviewed and stable  Post vital signs: Reviewed and stable  Last Vitals:  Vitals Value Taken Time  BP 91/60 05/30/23 1619  Temp 36.7 C 05/30/23 1619  Pulse 91 05/30/23 1623  Resp 16 05/30/23 1623  SpO2 100 % 05/30/23 1623  Vitals shown include unfiled device data.  Last Pain:  Vitals:   05/30/23 1619  TempSrc: Oral  PainSc:          Complications: There were no known notable events for this encounter.

## 2023-05-30 NOTE — ED Notes (Signed)
 Electronic consent for blood transfusion signed by son at bedside due to AMS.

## 2023-05-30 NOTE — Anesthesia Preprocedure Evaluation (Signed)
 Anesthesia Evaluation  Patient identified by MRN, date of birth, ID band Patient awake    Reviewed: Allergy & Precautions, NPO status , Patient's Chart, lab work & pertinent test results, reviewed documented beta blocker date and time   History of Anesthesia Complications Negative for: history of anesthetic complications  Airway Mallampati: IV     Mouth opening: Limited Mouth Opening  Dental  (+) Poor Dentition   Pulmonary neg COPD, neg PE   breath sounds clear to auscultation       Cardiovascular Exercise Tolerance: Poor hypertension, (-) CAD, (-) Past MI, (-) Cardiac Stents and (-) CHF  Rhythm:Regular Rate:Tachycardia     Neuro/Psych neg Seizures  Anxiety        GI/Hepatic PUD,GERD  ,,(+) neg Cirrhosis        Endo/Other    Renal/GU Renal disease     Musculoskeletal  (+) Arthritis ,    Abdominal   Peds  Hematology  (+) Blood dyscrasia, anemia   Anesthesia Other Findings   Reproductive/Obstetrics                              Anesthesia Physical Anesthesia Plan  ASA: 3  Anesthesia Plan: MAC   Post-op Pain Management:    Induction: Intravenous  PONV Risk Score and Plan: 1 and Propofol  infusion  Airway Management Planned: Nasal Cannula and Natural Airway  Additional Equipment:   Intra-op Plan:   Post-operative Plan: Extubation in OR  Informed Consent: I have reviewed the patients History and Physical, chart, labs and discussed the procedure including the risks, benefits and alternatives for the proposed anesthesia with the patient or authorized representative who has indicated his/her understanding and acceptance.     Dental advisory given and Consent reviewed with POA  Plan Discussed with: CRNA  Anesthesia Plan Comments:          Anesthesia Quick Evaluation

## 2023-05-30 NOTE — Progress Notes (Signed)
 Patient admitted earlier this morning for hematochezia and was found to have hemoglobin of 8.4 and proctocolitis on imaging.  She has been started on IV antibiotics.  Patient seen and examined at bedside and plan of care discussed with son at bedside.  I have reviewed patient's medical records including this morning's H&P, current vitals, labs and medications myself.  Hemoglobin this morning is 9.  Monitor H&H.  Continue antibiotics.  Consult GI.

## 2023-05-30 NOTE — Op Note (Signed)
 [facility] Patient Name: Jodi Jefferson Procedure Date : 05/30/2023 MRN: [id number] Attending MD: Inocente Hausen , MD, [id number] Date of Birth: July 04, 1931 CSN: [id number] Age: 89 Admit Type: Inpatient Procedure:                Flexible Sigmoidoscopy Indications:              Melena, Abnormal CT of the GI tract showing                            left-sided proctocolitis- rule out ischemic                            colitis, Acute post hemorrhagic anemia Providers:                Inocente Hausen, MD, Collene Edu, RN, Farris Southgate,                            Technician Referring MD:              Medicines:                Monitored Anesthesia Care Complications:            No immediate complications. Estimated blood loss:                            None. Estimated Blood Loss:     Estimated blood loss: none. Procedure:                Pre-Anesthesia Assessment:                           - Prior to the procedure, a History and Physical                            was performed, and patient medications and                            allergies were reviewed. The patient's tolerance of                            previous anesthesia was also reviewed. The risks                            and benefits of the procedure and the sedation                            options and risks were discussed with the patient.                            All questions were answered, and informed consent                            was obtained. Prior Anticoagulants: The patient has                            taken no  anticoagulant or antiplatelet agents. ASA                            Grade Assessment: III - A patient with severe                            systemic disease. After reviewing the risks and                            benefits, the patient was deemed in satisfactory                            condition to undergo the procedure.                           - Prior to the procedure, a History and  Physical                            was performed, and patient medications and                            allergies were reviewed. The patient's tolerance of                            previous anesthesia was also reviewed. The risks                            and benefits of the procedure and the sedation                            options and risks were discussed with the patient.                            All questions were answered, and informed consent                            was obtained. Prior Anticoagulants: The patient has                            taken no anticoagulant or antiplatelet agents. ASA                            Grade Assessment: III - A patient with severe                            systemic disease. After reviewing the risks and                            benefits, the patient was deemed in satisfactory                            condition to undergo the procedure.  After obtaining informed consent, the scope was                            passed under direct vision. The [id number]-H190 ([id number])                            Olympus endoscope was introduced through the anus                            and advanced to the descending colon. The flexible                            sigmoidoscopy was accomplished without difficulty.                            The patient tolerated the procedure well. The                            quality of the bowel preparation was fair. Scope In: Scope Out: Findings:      Hemorrhoids were found on perianal exam.      The digital rectal exam findings include decreased sphincter tone.       Pertinent negatives include no palpable rectal lesions.      Hematin (altered blood/coffee-ground-like material) was found in the       recto-sigmoid colon and in the descending colon. Lavage of the area was       performed using copious amounts of sterile water, resulting in       incomplete clearance with fair visualization.       Normal mucosa was found in the recto-sigmoid colon and in the descending       colon. No evidence of inflammation to correlate with proctocolitis seen       on CT scan. No ischemic colitis present. Impression:               - Preparation of the colon was fair.                           - Large amount of dark stool/melena throughout the                            rectosigmoid and descending colon                           - Normal-appearing mucosa in the left colon without                            findings to correlate with radiographic description                            of proctocolitis. No ischemic colitis present.                           - No specimens collected. Recommendation:           - Return patient to hospital ward for ongoing care.                           -  Follow serial H/H and hemodynamics                           - Anticipate patient will continue to pass melena                            over the next 24 to 48 hours Procedure Code(s):        --- Professional ---                           [phone], Sigmoidoscopy, flexible; diagnostic,                            including collection of specimen(s) by brushing or                            washing, when performed (separate procedure) Diagnosis Code(s):        --- Professional ---                           K92.1, Melena (includes Hematochezia)                           D62, Acute posthemorrhagic anemia                           R93.3, Abnormal findings on diagnostic imaging of                            other parts of digestive tract CPT copyright 2022 American [employer]. All rights reserved. The codes documented in this report are preliminary and upon coder review may  be revised to meet current compliance requirements. Inocente Hausen, MD 05/30/2023 4:30:14 PM This report has been signed electronically. Number of Addenda: 0

## 2023-05-31 ENCOUNTER — Encounter (HOSPITAL_COMMUNITY): Payer: Self-pay | Admitting: Pediatrics

## 2023-05-31 DIAGNOSIS — K529 Noninfective gastroenteritis and colitis, unspecified: Secondary | ICD-10-CM | POA: Diagnosis not present

## 2023-05-31 DIAGNOSIS — K222 Esophageal obstruction: Secondary | ICD-10-CM | POA: Diagnosis not present

## 2023-05-31 DIAGNOSIS — I10 Essential (primary) hypertension: Secondary | ICD-10-CM

## 2023-05-31 DIAGNOSIS — F419 Anxiety disorder, unspecified: Secondary | ICD-10-CM

## 2023-05-31 DIAGNOSIS — D62 Acute posthemorrhagic anemia: Secondary | ICD-10-CM | POA: Diagnosis not present

## 2023-05-31 DIAGNOSIS — K269 Duodenal ulcer, unspecified as acute or chronic, without hemorrhage or perforation: Secondary | ICD-10-CM

## 2023-05-31 LAB — CBC WITH DIFFERENTIAL/PLATELET
Abs Immature Granulocytes: 0.07 10*3/uL (ref 0.00–0.07)
Basophils Absolute: 0.1 10*3/uL (ref 0.0–0.1)
Basophils Relative: 1 %
Eosinophils Absolute: 0.1 10*3/uL (ref 0.0–0.5)
Eosinophils Relative: 1 %
HCT: 21.4 % — ABNORMAL LOW (ref 36.0–46.0)
Hemoglobin: 7.2 g/dL — ABNORMAL LOW (ref 12.0–15.0)
Immature Granulocytes: 1 %
Lymphocytes Relative: 14 %
Lymphs Abs: 1.5 10*3/uL (ref 0.7–4.0)
MCH: 32 pg (ref 26.0–34.0)
MCHC: 33.6 g/dL (ref 30.0–36.0)
MCV: 95.1 fL (ref 80.0–100.0)
Monocytes Absolute: 0.9 10*3/uL (ref 0.1–1.0)
Monocytes Relative: 8 %
Neutro Abs: 8.2 10*3/uL — ABNORMAL HIGH (ref 1.7–7.7)
Neutrophils Relative %: 75 %
Platelets: 257 10*3/uL (ref 150–400)
RBC: 2.25 MIL/uL — ABNORMAL LOW (ref 3.87–5.11)
RDW: 16.5 % — ABNORMAL HIGH (ref 11.5–15.5)
WBC: 10.8 10*3/uL — ABNORMAL HIGH (ref 4.0–10.5)
nRBC: 0.5 % — ABNORMAL HIGH (ref 0.0–0.2)

## 2023-05-31 LAB — MAGNESIUM: Magnesium: 1.6 mg/dL — ABNORMAL LOW (ref 1.7–2.4)

## 2023-05-31 LAB — BASIC METABOLIC PANEL
Anion gap: 8 (ref 5–15)
BUN: 15 mg/dL (ref 8–23)
CO2: 20 mmol/L — ABNORMAL LOW (ref 22–32)
Calcium: 8.3 mg/dL — ABNORMAL LOW (ref 8.9–10.3)
Chloride: 111 mmol/L (ref 98–111)
Creatinine, Ser: <0.3 mg/dL — ABNORMAL LOW (ref 0.44–1.00)
Glucose, Bld: 77 mg/dL (ref 70–99)
Potassium: 3.3 mmol/L — ABNORMAL LOW (ref 3.5–5.1)
Sodium: 139 mmol/L (ref 135–145)

## 2023-05-31 LAB — HEMOGLOBIN AND HEMATOCRIT, BLOOD
HCT: 24.6 % — ABNORMAL LOW (ref 36.0–46.0)
Hemoglobin: 8.3 g/dL — ABNORMAL LOW (ref 12.0–15.0)

## 2023-05-31 MED ORDER — POTASSIUM CHLORIDE 20 MEQ PO PACK
40.0000 meq | PACK | Freq: Once | ORAL | Status: AC
  Administered 2023-05-31: 40 meq via ORAL
  Filled 2023-05-31: qty 2

## 2023-05-31 MED ORDER — MAGNESIUM SULFATE 2 GM/50ML IV SOLN
2.0000 g | Freq: Once | INTRAVENOUS | Status: AC
  Administered 2023-05-31: 2 g via INTRAVENOUS
  Filled 2023-05-31: qty 50

## 2023-05-31 NOTE — Plan of Care (Signed)
  Problem: Clinical Measurements: Goal: Ability to maintain clinical measurements within normal limits will improve Outcome: Progressing Goal: Will remain free from infection Outcome: Progressing   Problem: Nutrition: Goal: Adequate nutrition will be maintained Outcome: Progressing   Problem: Pain Managment: Goal: General experience of comfort will improve and/or be controlled Outcome: Progressing   Problem: Safety: Goal: Ability to remain free from injury will improve Outcome: Progressing   Problem: Skin Integrity: Goal: Risk for impaired skin integrity will decrease Outcome: Progressing

## 2023-05-31 NOTE — TOC Initial Note (Signed)
 Transition of Care [facility]) - Initial/Assessment Note    Patient Details  Name: Jodi Jefferson MRN: [id number] Date of Birth: [birth year]  [facility] CM/SW Contact:    Terre Ferri, RN Phone Number: 05/31/2023, 1:12 PM  Clinical Narrative:                  Spoke to patient, son Georgette Kins and another son at bedside. Patient from home with son Carloyn Chi.   Per family patient active with [facility] for palliative care.  They do not want hospice. NCM called Alisa App with [facility] . Per Alisa App they do not offer palliative care. Order they received from PCP was for hospice and son Carloyn Chi signed consents for hospice.   Hannah asked NCM to give Doug her cell phone number and she will explain to Boyd.   NCM explained above to Boyd and other family in the room. NCM discussed palliative care vs hospice. Family would like a consult with palliative care while she is in the hospital. Secure chatted MD. MD ordered same. Georgette Kins has Hannah's number and NCM direct cell number.   They would like patient to have a hospital bed, better wheelchair at discharge.   NCM explained if they chose to stay with hospice , hospice will order DME. If they revoke hospice NCM will ask MD for orders.   TOC will continue to follow.  Expected Discharge Plan:  (see note) Barriers to Discharge: Continued Medical Work up   Patient Goals and CMS Choice            Expected Discharge Plan and Services   Discharge Planning Services: CM Consult Post Acute Care Choice:  (see note) Living arrangements for the past 2 months: Single Family Home                                      Prior Living Arrangements/Services Living arrangements for the past 2 months: Single Family Home Lives with:: Adult Children Patient language and need for interpreter reviewed:: Yes        Need for Family Participation in Patient Care: Yes (Comment) Care giver support system in place?: Yes (comment) Current home services:  DME Criminal Activity/Legal Involvement Pertinent to Current Situation/Hospitalization: No - Comment as needed  Activities of Daily Living      Permission Sought/Granted   Permission granted to share information with : Yes, Verbal Permission Granted  Share Information with NAME: sons Georgette Kins  Permission granted to share info w AGENCY: [facility]        Emotional Assessment Appearance:: Appears stated age     Orientation: : Oriented to Situation Alcohol / Substance Use: Not Applicable    Admission diagnosis:  Melena [K92.1] Proctocolitis [K52.9] Acute blood loss anemia (ABLA) [D62] Patient Active Problem List   Diagnosis Date Noted   Proctocolitis 05/30/2023   Severe sepsis (HCC) 05/30/2023   Acute blood loss anemia (ABLA) 05/30/2023   Acute lower gastrointestinal bleeding 05/30/2023   Melena 05/30/2023   Duodenal ulcer 05/30/2023   Inguinal hernia 10/22/2021   Hypokalemia 10/18/2021   Leukocytosis 10/18/2021   Cataract    Allergy    Leg swelling 01/02/2019   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   Macular degeneration 01/26/2017   Closed pelvic fracture (HCC) 01/26/2017   Essential hypertension 01/26/2017   GERD (gastroesophageal reflux disease) 01/26/2017   Arthritis 01/26/2017   Hyperlipemia, mixed 01/26/2017   Osteoporosis 01/26/2017  Anxiety 01/26/2017   Chest pain 01/26/2017   Abnormal EKG 01/26/2017   Arthralgia of left knee 11/29/2013   Primary osteoarthritis of left knee 11/29/2013   PCP:  [facility], [facility]:   [facility] [address], [place] - [address] [place] [address] Phone: [phone] Fax: [phone]     Social Drivers of Health (SDOH) Social History: SDOH Screenings   Food Insecurity: No Food Insecurity (05/30/2023)  Housing: Low Risk  (05/30/2023)  Transportation Needs: No Transportation Needs (05/30/2023)  Utilities: Not At Risk (05/30/2023)  Social Connections: Unknown (05/30/2023)  Tobacco  Use: Low Risk  (05/30/2023)   SDOH Interventions: Food Insecurity Interventions: Intervention Not Indicated Housing Interventions: Intervention Not Indicated Transportation Interventions: Intervention Not Indicated Utilities Interventions: Intervention Not Indicated Social Connections Interventions: Intervention Not Indicated   Readmission Risk Interventions     No data to display

## 2023-05-31 NOTE — Plan of Care (Signed)
  Problem: Clinical Measurements: Goal: Diagnostic test results will improve Outcome: Progressing   Problem: Education: Goal: Knowledge of General Education information will improve Description: Including pain rating scale, medication(s)/side effects and non-pharmacologic comfort measures Outcome: Not Progressing   Problem: Health Behavior/Discharge Planning: Goal: Ability to manage health-related needs will improve Outcome: Not Progressing   Problem: Clinical Measurements: Goal: Ability to maintain clinical measurements within normal limits will improve Outcome: Not Progressing   Problem: Nutrition: Goal: Adequate nutrition will be maintained Outcome: Not Progressing

## 2023-05-31 NOTE — Anesthesia Postprocedure Evaluation (Signed)
 Anesthesia Post Note  Patient: Jodi Jefferson  Procedure(s) Performed: ESOPHAGOGASTRODUODENOSCOPY (EGD) WITH PROPOFOL  FLEXIBLE SIGMOIDOSCOPY BIOPSY     Patient location during evaluation: PACU Anesthesia Type: MAC Level of consciousness: awake and alert Pain management: pain level controlled Vital Signs Assessment: post-procedure vital signs reviewed and stable Respiratory status: spontaneous breathing, nonlabored ventilation, respiratory function stable and patient connected to nasal cannula oxygen Cardiovascular status: stable and blood pressure returned to baseline Postop Assessment: no apparent nausea or vomiting Anesthetic complications: no   There were no known notable events for this encounter.  Last Vitals:  Vitals:   05/30/23 1958 05/31/23 0050  BP: 114/66 (!) 110/51  Pulse: 91 81  Resp: 18 18  Temp: 37 C 37.1 C  SpO2: 97% 98%    Last Pain:  Vitals:   05/31/23 0050  TempSrc: Oral  PainSc:                  Lynwood MARLA Cornea

## 2023-05-31 NOTE — Progress Notes (Signed)
 Patient ID: Jodi Jefferson, female   DOB: [birth year], 89 y.o.   MRN: [id number]    Progress Note   Subjective   Day # 2 CC; acute GI bleed, melena, epigastric pain  IV PPI twice daily  Hemoglobin 7.2 this a.m., down from 9 yesterday-patient is unaware of any further bowel movements or bleeding ( status post 1 unit of blood on admission) Potassium 3.3/BUN 15/creatinine 0.3  EGD yesterday-patent nonobstructing Schatzki's ring, normal stomach, biopsies pending, 3 nonbleeding cratered duodenal ulcers with no stigmata of bleeding in the duodenal bulb largest 8 mm second portion of duodenum normal Flex sigmoid yesterday-old blood, in the rectosigmoid and descending colon otherwise negative  Family at bedside this morning, multiple questions as patient is in need of multiple tooth extractions and apparently bilateral hip replacements. Pt has no complaints of nausea or vomiting, she is hungry, endorses mild epigastric discomfort   Objective   Vital signs in last 24 hours: Temp:  [97.6 F (36.4 C)-98.8 F (37.1 C)] 97.9 F (36.6 C) (02/10 0717) Pulse Rate:  [72-104] 72 (02/10 0717) Resp:  [15-22] 16 (02/10 0717) BP: (91-145)/(51-79) 131/68 (02/10 0717) SpO2:  [97 %-100 %] 100 % (02/10 0717) Weight:  [44 kg] 44 kg (02/10 0500) Last BM Date : 05/30/23 General:   Very elderly white female in NAD pleasant, family at bedside Heart:  Regular rate and rhythm; no murmurs Lungs: Respirations even and unlabored, lungs CTA bilaterally Abdomen:  Soft, minimally tender in the epigastrium and nondistended. Normal bowel sounds. Extremities:  Without edema. Neurologic:  Alert and oriented,  grossly normal neurologically. Psych:  Cooperative. Normal mood and affect.  Intake/Output from previous day: 02/09 0701 - 02/10 0700 In: 1504 [I.V.:1309.6; IV Piggyback:194.4] Out: 200 [Urine:200] Intake/Output this shift: Total I/O In: 200 [I.V.:100; IV Piggyback:100] Out: -   Lab Results: Recent  Labs    05/29/23 2320 05/29/23 2341 05/30/23 0529 05/31/23 0628  WBC 26.7*  --  21.7* 10.8*  HGB 8.4* 8.8* 9.0* 7.2*  HCT 25.7* 26.0* 25.9* 21.4*  PLT 363  --  291 257   BMET Recent Labs    05/29/23 2320 05/29/23 2341 05/30/23 0529 05/31/23 0628  NA 139 138 137 139  K 3.7 3.7 3.4* 3.3*  CL 106 106 106 111  CO2 22  --  19* 20*  GLUCOSE 189* 187* 126* 77  BUN 47* 40* 36* 15  CREATININE 0.60 0.40* 0.37* <0.30*  CALCIUM 9.0  --  8.5* 8.3*   LFT Recent Labs    05/30/23 0529  PROT 5.1*  ALBUMIN 2.7*  AST 24  ALT 19  ALKPHOS 143*  BILITOT 2.2*   PT/INR Recent Labs    05/29/23 2320  LABPROT 13.5  INR 1.0    Studies/Results: CT ANGIO GI BLEED Result Date: 05/30/2023 CLINICAL DATA:  Lower GI/rectal bleed. EXAM: CTA ABDOMEN AND PELVIS WITHOUT AND WITH CONTRAST TECHNIQUE: Multidetector CT imaging of the abdomen and pelvis was performed using the standard protocol during bolus administration of intravenous contrast. Multiplanar reconstructed images and MIPs were obtained and reviewed to evaluate the vascular anatomy. RADIATION DOSE REDUCTION: This exam was performed according to the departmental dose-optimization program which includes automated exposure control, adjustment of the mA and/or kV according to patient size and/or use of iterative reconstruction technique. CONTRAST:  88mL OMNIPAQUE  IOHEXOL  350 MG/ML SOLN COMPARISON:  CT with IV contrast 05/14/2023 and 02/16/2022 FINDINGS: VASCULAR Aorta: Normal caliber aorta without aneurysm, dissection, vasculitis or significant stenosis. There are moderate patchy calcific plaques.  Celiac: Patent without evidence of aneurysm, dissection, vasculitis or significant stenosis. There are no branch occlusions. SMA: Patent without evidence of aneurysm, dissection, vasculitis or significant stenosis. Scattered mixed plaques are present but cause no more than 30% stenosis. Renals: There is a 40% smooth soft plaque stenosis in the proximal 1 cm  of the left renal artery, but no flow-limiting stenosis of either renal artery. Right renal artery is widely patent. There are nonstenosing ostial calcific plaques of both renal arteries. IMA: Patent without evidence of aneurysm, dissection, vasculitis or significant stenosis. Inflow: There are moderate calcific plaques in the common iliac arteries. There are only minimal calcific plaques in the internal and external iliac arteries. Both of the internal iliac arteries demonstrate a 75% focal soft plaque origin stenosis and both are otherwise clear. There is no flow-limiting stenosis of either external iliac artery. Proximal Outflow: Bilateral common femoral and visualized portions of the superficial and profunda femoral arteries are patent without evidence of aneurysm, dissection, vasculitis or significant stenosis. Veins: Patent.  No portal vein dilatation. Review of the MIP images confirms the above findings. NON-VASCULAR Lower chest: COPD and chronic changes both lung bases. No acute infiltrate. Bochdalek's fat hernia posterior left diaphragm. The cardiac size is normal. Hepatobiliary: The liver is mildly steatotic, without mass enhancement, measuring 16 cm length. There are calcified granulomas in the left lobe. The gallbladder wall is thicker in the fundal area than elsewhere, but this was seen on the prior studies and is probably due to adenomyomatosis or other hyperplastic wall disease. It is not notably changed. There are no calcified stones or bile duct dilatation. Pancreas: No abnormality. Spleen: No abnormality noted. Adrenals/Urinary Tract: There are Bosniak 2 subcentimeter cysts in the kidneys which are too small to characterize but unchanged. No follow-up imaging is recommended. There is no mass enhancement in the adrenal glands and kidneys. No urinary stone or obstruction. The bladder unremarkable for the degree of distention. Stomach/Bowel: Small hiatal hernia. Unremarkable contracted stomach. Normal  caliber small bowel with fluid filling of the pelvic loops. Normal caliber appendix. There are thickened folds of large bowel from the splenic flexure distally consistent with proctocolitis. This exhibits mucosal enhancement but there is no visible focal vascular contrast leak into the bowel or stomach. A right inguinal wide mouth hernia again contains a short loop of the distal small bowel without upstream dilatation. No edema or fluid in the hernia sac suspicious for incarceration. Lymphatic: No adenopathy. Reproductive: Status post hysterectomy. No adnexal masses. Other: No free fluid, free air or free hemorrhage. Musculoskeletal: Osteopenia. There are bilateral partially healed sacral ala fractures, as per the prior study the left appears more recent. There is advanced degenerative arthrosis and remodeling of the left hip joint, mild-to-moderate degenerative change right hip joint, with levoscoliosis and degenerative change of the lumbar spine. IMPRESSION: 1. No evidence of active GI bleeding. 2. Aortic and branch vessel atherosclerosis. 3. 40% soft plaque stenosis proximal left renal artery. 4. 75% soft plaque origin stenoses of both internal iliac arteries. 5. Proctocolitis from the splenic flexure distally. No evidence of bowel obstruction or perforation. 6. Right inguinal wide mouth hernia containing a short loop of the distal small bowel without upstream dilatation or incarceration. 7. Osteopenia and degenerative change. 8. Bilateral partially healed sacral ala fractures, the left appears more recent. 9. COPD and chronic changes at the lung bases. 10. Mild hepatic steatosis. 11. Stable gallbladder wall thickening in the fundal area, probably due to adenomyomatosis or other hyperplastic wall disease.  Electronically Signed   By: Denman Fischer M.D.   On: 05/30/2023 02:01       Assessment / Plan:    #42 89 year old white female admitted with an acute upper GI bleed and also noted possible proctocolitis  on CT scan on admission. Stool was black and maroonish  With EGD and flexible sigmoidoscopy yesterday pertinent for 3 cratered duodenal ulcers, largest 8 mm all clean-based with no stigmata. Flexible sigmoidoscopy unremarkable other than noting dark old stool in the colon, no evidence for colitis  Patient has not had any active bleeding overnight but hemoglobin has drifted further, down to 7.2 today. Hopefully she is equilibrating from significant active bleed within the past 36 hours.  #2 anemia acute secondary to acute GI blood loss #3 history of B12 deficiency #4 prior history of peptic ulcer disease #5.  COPD #6.  Status post several abdominal surgeries including appendectomy hysterectomy and right inguinal hernia repair /containing small bowel  Plan; start soft diet today Continue IV PPI twice daily at least another 24 hours Check hemoglobin every 12 hours and transfuse for hemoglobin less than 7, discussed with patient and family.   Family has appointment arranged for patient to have tooth extractions a week from today, asking if that is appropriate in her current situation.  Will see how she does over the next day or 2 if no further active bleeding, stable hemoglobin and then it may be okay to proceed with the Tooth extractions in a week, realizing that she is very anemic.  We also discussed importance of avoiding all aspirin and NSAIDs moving forward upcoming procedures etc.    Principal Problem:   Proctocolitis Active Problems:   Essential hypertension   Leukocytosis   Severe sepsis (HCC)   Acute blood loss anemia (ABLA)   Acute lower gastrointestinal bleeding   Melena   Duodenal ulcer     LOS: 1 day   Rolando Hessling EsterwoodPA-C  05/31/2023, 11:15 AM

## 2023-05-31 NOTE — Progress Notes (Signed)
 PROGRESS NOTE    Jodi Jefferson  ZOX:[id number] DOB: April 02, 1932 DOA: 05/29/2023 PCP: [facility]   Brief Narrative:  89 year old female with history of essential hypertension and possible undiagnosed underlying dementia presented with hematochezia.  On presentation, hemoglobin was 8.4, lactic acid of 2.6 then 1.8.  CTA GI bleed study showed proctocolitis from the splenic flexure distally, without any evidence of obstruction, abscess or perforation and showed no active GI bleeding.  She was started on IV antibiotics, Protonix  and fluids.  GI was consulted.  She underwent EGD on 05/30/2023  Assessment & Plan:   Hematochezia with hypotension and acute blood loss anemia Duodenal ulcers Severe sepsis: Present on admission due to proctocolitis Lactic acidosis: Improved Proctocolitis -Presented with leukocytosis, tachycardia, tachypnea, lactic acidosis and proctocolitis -GI following.  Underwent EGD on 05/30/2023 which showed duodenal ulcers.  Currently on Protonix  IV every 12. -Hemoglobin 7.2 this morning.  Monitor H&H. -Continue broad-spectrum antibiotics -Decrease normal saline to 50 cc an hour. -Diet advancement as per GI  Leukocytosis -Resolved  Hypokalemia -Replace.  Repeat a.m. labs  Hypomagnesemia -Replace.  Repeat a.m. labs  Hypertension -Blood pressure on the lower side.  Lisinopril  on hold for now.  Probable undiagnosed underlying dementia -Follow/delirium precautions.  PT eval.  Outpatient follow-up with PCP and/or neurology -CODE STATUS discussed with son and daughter-in-law at bedside today and he agreed for DNR.  Will change her to DNR.  DVT prophylaxis: SCDs Code Status: DNR  family Communication: Son and daughter-in-law at bedside  disposition Plan: Status is: Inpatient Remains inpatient appropriate because: Of severity of illness    Consultants: GI  Procedures:  EGD on 05/30/2023 Impression:               - Normal esophagus.                            - Widely patent and non-obstructing Schatzki ring.                           - Normal gastric body, antrum, cardia and gastric                            fundus. Biopsied.                           - Small hiatal hernia.                           - Non-bleeding duodenal ulcers with no stigmata of                            bleeding.                           - Normal second portion of the duodenum. Recommendation:           - Return patient to hospital ward for ongoing care.                           - Continue Protonix  40 mg IV twice daily                           -  Monitor serial H&H and hemodynamics                           - Await pathology results -if positive for H.                            pylori we will treat with quadruple therapy                           - Avoid NSAIDs. Antimicrobials:  Anti-infectives (From admission, onward)    Start     Dose/Rate Route Frequency Ordered Stop   05/30/23 1200  metroNIDAZOLE  (FLAGYL ) IVPB 500 mg        500 mg 100 mL/hr over 60 Minutes Intravenous 2 times daily 05/30/23 0241     05/30/23 1000  cefTRIAXone  (ROCEPHIN ) 2 g in sodium chloride  0.9 % 100 mL IVPB        2 g 200 mL/hr over 30 Minutes Intravenous Daily 05/30/23 0241     05/30/23 0215  ceFEPIme  (MAXIPIME ) 2 g in sodium chloride  0.9 % 100 mL IVPB        2 g 200 mL/hr over 30 Minutes Intravenous  Once 05/30/23 0207 05/30/23 0333   05/30/23 0215  metroNIDAZOLE  (FLAGYL ) IVPB 500 mg        500 mg 100 mL/hr over 60 Minutes Intravenous  Once 05/30/23 0207 05/30/23 0429        Subjective: Patient seen and examined at bedside.  Poor historian.  No agitation, seizures or vomiting reported.  No black or bloody stools reported by nursing staff.  Objective: Vitals:   05/31/23 0050 05/31/23 0422 05/31/23 0500 05/31/23 0717  BP: (!) 110/51 (!) 111/56  131/68  Pulse: 81 74  72  Resp: 18 18  16   Temp: 98.8 F (37.1 C) 98 F (36.7 C)  97.9 F (36.6 C)  TempSrc: Oral Oral   Oral  SpO2: 98% 99%  100%  Weight:   44 kg   Height:        Intake/Output Summary (Last 24 hours) at 05/31/2023 0728 Last data filed at 05/31/2023 1610 Gross per 24 hour  Intake 1704 ml  Output 200 ml  Net 1504 ml   Filed Weights   05/29/23 2311 05/31/23 0500  Weight: 44 kg 44 kg    Examination:  General exam: Appears calm and comfortable.  Looks chronically ill and deconditioned.  On room air. Respiratory system: Bilateral decreased breath sounds at bases Cardiovascular system: S1 & S2 heard, Rate controlled Gastrointestinal system: Abdomen is nondistended, soft and nontender. Normal bowel sounds heard. Extremities: No cyanosis, clubbing, edema  Central nervous system: Awake, slow to respond.  Poor historian.  No focal neurological deficits. Moving extremities Skin: No rashes, lesions or ulcers Psychiatry: Flat affect.  Not agitated.   Data Reviewed: I have personally reviewed following labs and imaging studies  CBC: Recent Labs  Lab 05/29/23 2320 05/29/23 2341 05/30/23 0529 05/31/23 0628  WBC 26.7*  --  21.7* 10.8*  NEUTROABS 23.9*  --  18.5* 8.2*  HGB 8.4* 8.8* 9.0* 7.2*  HCT 25.7* 26.0* 25.9* 21.4*  MCV 98.1  --  91.8 95.1  PLT 363  --  291 257   Basic Metabolic Panel: Recent Labs  Lab 05/29/23 2320 05/29/23 2341 05/30/23 0529  NA 139 138 137  K 3.7 3.7 3.4*  CL 106 106  106  CO2 22  --  19*  GLUCOSE 189* 187* 126*  BUN 47* 40* 36*  CREATININE 0.60 0.40* 0.37*  CALCIUM 9.0  --  8.5*  MG  --   --  1.7   GFR: Estimated Creatinine Clearance: 30.8 mL/min (A) (by C-G formula based on SCr of 0.37 mg/dL (L)). Liver Function Tests: Recent Labs  Lab 05/29/23 2320 05/30/23 0529  AST 25 24  ALT 19 19  ALKPHOS 169* 143*  BILITOT 0.5 2.2*  PROT 5.6* 5.1*  ALBUMIN 2.8* 2.7*   No results for input(s): "LIPASE", "AMYLASE" in the last 168 hours. No results for input(s): "AMMONIA" in the last 168 hours. Coagulation Profile: Recent Labs  Lab  05/29/23 2320  INR 1.0   Cardiac Enzymes: No results for input(s): "CKTOTAL", "CKMB", "CKMBINDEX", "TROPONINI" in the last 168 hours. BNP (last 3 results) No results for input(s): "PROBNP" in the last 8760 hours. HbA1C: No results for input(s): "HGBA1C" in the last 72 hours. CBG: Recent Labs  Lab 05/30/23 1623  GLUCAP 104*   Lipid Profile: No results for input(s): "CHOL", "HDL", "LDLCALC", "TRIG", "CHOLHDL", "LDLDIRECT" in the last 72 hours. Thyroid  Function Tests: No results for input(s): "TSH", "T4TOTAL", "FREET4", "T3FREE", "THYROIDAB" in the last 72 hours. Anemia Panel: Recent Labs    05/30/23 0529  FERRITIN 426*  TIBC 256  IRON 216*   Sepsis Labs: Recent Labs  Lab 05/29/23 2342 05/30/23 0225  LATICACIDVEN 2.6* 1.8    Recent Results (from the past 240 hours)  Culture, blood (single)     Status: None (Preliminary result)   Collection Time: 05/30/23  5:29 AM   Specimen: BLOOD  Result Value Ref Range Status   Specimen Description BLOOD BLOOD RIGHT ARM  Final   Special Requests   Final    BOTTLES DRAWN AEROBIC AND ANAEROBIC Blood Culture adequate volume   Culture   Final    NO GROWTH < 12 HOURS Performed at [facility], [address] [place], [place] [address]    Report Status PENDING  Incomplete         Radiology Studies: CT ANGIO GI BLEED Result Date: 05/30/2023 CLINICAL DATA:  Lower GI/rectal bleed. EXAM: CTA ABDOMEN AND PELVIS WITHOUT AND WITH CONTRAST TECHNIQUE: Multidetector CT imaging of the abdomen and pelvis was performed using the standard protocol during bolus administration of intravenous contrast. Multiplanar reconstructed images and MIPs were obtained and reviewed to evaluate the vascular anatomy. RADIATION DOSE REDUCTION: This exam was performed according to the departmental dose-optimization program which includes automated exposure control, adjustment of the mA and/or kV according to patient size and/or use of iterative reconstruction  technique. CONTRAST:  88mL OMNIPAQUE  IOHEXOL  350 MG/ML SOLN COMPARISON:  CT with IV contrast 05/14/2023 and 02/16/2022 FINDINGS: VASCULAR Aorta: Normal caliber aorta without aneurysm, dissection, vasculitis or significant stenosis. There are moderate patchy calcific plaques. Celiac: Patent without evidence of aneurysm, dissection, vasculitis or significant stenosis. There are no branch occlusions. SMA: Patent without evidence of aneurysm, dissection, vasculitis or significant stenosis. Scattered mixed plaques are present but cause no more than 30% stenosis. Renals: There is a 40% smooth soft plaque stenosis in the proximal 1 cm of the left renal artery, but no flow-limiting stenosis of either renal artery. Right renal artery is widely patent. There are nonstenosing ostial calcific plaques of both renal arteries. IMA: Patent without evidence of aneurysm, dissection, vasculitis or significant stenosis. Inflow: There are moderate calcific plaques in the common iliac arteries. There are only minimal  calcific plaques in the internal and external iliac arteries. Both of the internal iliac arteries demonstrate a 75% focal soft plaque origin stenosis and both are otherwise clear. There is no flow-limiting stenosis of either external iliac artery. Proximal Outflow: Bilateral common femoral and visualized portions of the superficial and profunda femoral arteries are patent without evidence of aneurysm, dissection, vasculitis or significant stenosis. Veins: Patent.  No portal vein dilatation. Review of the MIP images confirms the above findings. NON-VASCULAR Lower chest: COPD and chronic changes both lung bases. No acute infiltrate. Bochdalek's fat hernia posterior left diaphragm. The cardiac size is normal. Hepatobiliary: The liver is mildly steatotic, without mass enhancement, measuring 16 cm length. There are calcified granulomas in the left lobe. The gallbladder wall is thicker in the fundal area than elsewhere, but this  was seen on the prior studies and is probably due to adenomyomatosis or other hyperplastic wall disease. It is not notably changed. There are no calcified stones or bile duct dilatation. Pancreas: No abnormality. Spleen: No abnormality noted. Adrenals/Urinary Tract: There are Bosniak 2 subcentimeter cysts in the kidneys which are too small to characterize but unchanged. No follow-up imaging is recommended. There is no mass enhancement in the adrenal glands and kidneys. No urinary stone or obstruction. The bladder unremarkable for the degree of distention. Stomach/Bowel: Small hiatal hernia. Unremarkable contracted stomach. Normal caliber small bowel with fluid filling of the pelvic loops. Normal caliber appendix. There are thickened folds of large bowel from the splenic flexure distally consistent with proctocolitis. This exhibits mucosal enhancement but there is no visible focal vascular contrast leak into the bowel or stomach. A right inguinal wide mouth hernia again contains a short loop of the distal small bowel without upstream dilatation. No edema or fluid in the hernia sac suspicious for incarceration. Lymphatic: No adenopathy. Reproductive: Status post hysterectomy. No adnexal masses. Other: No free fluid, free air or free hemorrhage. Musculoskeletal: Osteopenia. There are bilateral partially healed sacral ala fractures, as per the prior study the left appears more recent. There is advanced degenerative arthrosis and remodeling of the left hip joint, mild-to-moderate degenerative change right hip joint, with levoscoliosis and degenerative change of the lumbar spine. IMPRESSION: 1. No evidence of active GI bleeding. 2. Aortic and branch vessel atherosclerosis. 3. 40% soft plaque stenosis proximal left renal artery. 4. 75% soft plaque origin stenoses of both internal iliac arteries. 5. Proctocolitis from the splenic flexure distally. No evidence of bowel obstruction or perforation. 6. Right inguinal wide mouth  hernia containing a short loop of the distal small bowel without upstream dilatation or incarceration. 7. Osteopenia and degenerative change. 8. Bilateral partially healed sacral ala fractures, the left appears more recent. 9. COPD and chronic changes at the lung bases. 10. Mild hepatic steatosis. 11. Stable gallbladder wall thickening in the fundal area, probably due to adenomyomatosis or other hyperplastic wall disease. Electronically Signed   By: Denman Fischer M.D.   On: 05/30/2023 02:01        Scheduled Meds:  sodium chloride    Intravenous Once   pantoprazole  (PROTONIX ) IV  40 mg Intravenous Q8H   Followed by   [START ON 06/02/2023] pantoprazole  (PROTONIX ) IV  40 mg Intravenous Q12H   Continuous Infusions:  sodium chloride  75 mL/hr at 05/30/23 2310   cefTRIAXone  (ROCEPHIN )  IV Stopped (05/30/23 1001)   metronidazole  100 mL/hr at 05/31/23 1610          Audria Leather, MD [facility] 05/31/2023, 7:28 AM

## 2023-06-01 ENCOUNTER — Encounter: Payer: Self-pay | Admitting: Pediatrics

## 2023-06-01 ENCOUNTER — Telehealth: Payer: Self-pay | Admitting: Internal Medicine

## 2023-06-01 DIAGNOSIS — D62 Acute posthemorrhagic anemia: Secondary | ICD-10-CM | POA: Diagnosis not present

## 2023-06-01 DIAGNOSIS — K269 Duodenal ulcer, unspecified as acute or chronic, without hemorrhage or perforation: Secondary | ICD-10-CM | POA: Diagnosis not present

## 2023-06-01 DIAGNOSIS — K529 Noninfective gastroenteritis and colitis, unspecified: Secondary | ICD-10-CM | POA: Diagnosis not present

## 2023-06-01 LAB — CBC WITH DIFFERENTIAL/PLATELET
Abs Immature Granulocytes: 0.06 10*3/uL (ref 0.00–0.07)
Basophils Absolute: 0 10*3/uL (ref 0.0–0.1)
Basophils Relative: 0 %
Eosinophils Absolute: 0.1 10*3/uL (ref 0.0–0.5)
Eosinophils Relative: 1 %
HCT: 21.9 % — ABNORMAL LOW (ref 36.0–46.0)
Hemoglobin: 7.2 g/dL — ABNORMAL LOW (ref 12.0–15.0)
Immature Granulocytes: 1 %
Lymphocytes Relative: 13 %
Lymphs Abs: 1.2 10*3/uL (ref 0.7–4.0)
MCH: 32 pg (ref 26.0–34.0)
MCHC: 32.9 g/dL (ref 30.0–36.0)
MCV: 97.3 fL (ref 80.0–100.0)
Monocytes Absolute: 0.7 10*3/uL (ref 0.1–1.0)
Monocytes Relative: 8 %
Neutro Abs: 7.1 10*3/uL (ref 1.7–7.7)
Neutrophils Relative %: 77 %
Platelets: 254 10*3/uL (ref 150–400)
RBC: 2.25 MIL/uL — ABNORMAL LOW (ref 3.87–5.11)
RDW: 16.5 % — ABNORMAL HIGH (ref 11.5–15.5)
WBC: 9.2 10*3/uL (ref 4.0–10.5)
nRBC: 0.3 % — ABNORMAL HIGH (ref 0.0–0.2)

## 2023-06-01 LAB — BASIC METABOLIC PANEL
Anion gap: 7 (ref 5–15)
BUN: 13 mg/dL (ref 8–23)
CO2: 22 mmol/L (ref 22–32)
Calcium: 8.3 mg/dL — ABNORMAL LOW (ref 8.9–10.3)
Chloride: 110 mmol/L (ref 98–111)
Creatinine, Ser: <0.3 mg/dL — ABNORMAL LOW (ref 0.44–1.00)
Glucose, Bld: 100 mg/dL — ABNORMAL HIGH (ref 70–99)
Potassium: 3.2 mmol/L — ABNORMAL LOW (ref 3.5–5.1)
Sodium: 139 mmol/L (ref 135–145)

## 2023-06-01 LAB — SURGICAL PATHOLOGY

## 2023-06-01 LAB — MAGNESIUM: Magnesium: 1.9 mg/dL (ref 1.7–2.4)

## 2023-06-01 MED ORDER — POTASSIUM CHLORIDE 20 MEQ PO PACK
40.0000 meq | PACK | ORAL | Status: AC
  Administered 2023-06-01 (×2): 40 meq via ORAL
  Filled 2023-06-01 (×2): qty 2

## 2023-06-01 MED ORDER — SUCRALFATE 1 G PO TABS
1.0000 g | ORAL_TABLET | Freq: Three times a day (TID) | ORAL | Status: DC
  Administered 2023-06-01 – 2023-06-02 (×4): 1 g via ORAL
  Filled 2023-06-01 (×3): qty 1

## 2023-06-01 NOTE — Telephone Encounter (Signed)
I attempted to reach the patient's son by phone but reached voicemail I did meet with the patient and one of her sons recently at bedside in the hospital

## 2023-06-01 NOTE — Progress Notes (Signed)
PROGRESS NOTE    Jodi Jefferson  UXL:[id number] DOB: 1931-11-19 DOA: 05/29/2023 PCP: [facility]   Brief Narrative:  89 year old female with history of essential hypertension and possible undiagnosed underlying dementia presented with hematochezia.  On presentation, hemoglobin was 8.4, lactic acid of 2.6 then 1.8.  CTA GI bleed study showed proctocolitis from the splenic flexure distally, without any evidence of obstruction, abscess or perforation and showed no active GI bleeding.  She was started on IV antibiotics, Protonix and fluids.  GI was consulted.  She underwent EGD and flexible sigmoidoscopy on 05/30/2023.  Assessment & Plan:   Hematochezia with hypotension and acute blood loss anemia Duodenal ulcers Severe sepsis: Present on admission due to proctocolitis Lactic acidosis: Improved Proctocolitis -Presented with leukocytosis, tachycardia, tachypnea, lactic acidosis and proctocolitis -GI following.  Underwent EGD on 05/30/2023 which showed duodenal ulcers.  Flexible sigmoidoscopy unremarkable other than noting dark old stool in the colon, no evidence for colitis.  Currently on Protonix IV every 12 hours. -Hemoglobin 7.2 this morning.  Monitor H&H. -Currently on broad-spectrum antibiotics.  Will follow-up with GI regarding need for further antibiotics. -DC IV fluids. -Continue soft diet  Leukocytosis -Resolved  Hypokalemia -Replace.  Repeat a.m. labs  Hypomagnesemia -Improved  Hypertension -Blood pressure on the lower side.  Lisinopril on hold for now.  Probable undiagnosed underlying dementia -Follow/delirium precautions.  PT eval.  Outpatient follow-up with PCP and/or neurology -Palliative care consulted per family request.  DVT prophylaxis: SCDs Code Status: DNR  family Communication: Son at bedside  disposition Plan: Status is: Inpatient Remains inpatient appropriate because: Of severity of illness    Consultants: GI/palliative  care  Procedures:  EGD on 05/30/2023 Impression:               - Normal esophagus.                           - Widely patent and non-obstructing Schatzki ring.                           - Normal gastric body, antrum, cardia and gastric                            fundus. Biopsied.                           - Small hiatal hernia.                           - Non-bleeding duodenal ulcers with no stigmata of                            bleeding.                           - Normal second portion of the duodenum. Recommendation:           - Return patient to hospital ward for ongoing care.                           - Continue Protonix 40 mg IV twice daily                           -  Monitor serial H&H and hemodynamics                           - Await pathology results -if positive for H.                            pylori we will treat with quadruple therapy                           - Avoid NSAIDs.  Flexible sigmoidoscopy: As above Antimicrobials:  Anti-infectives (From admission, onward)    Start     Dose/Rate Route Frequency Ordered Stop   05/30/23 1200  metroNIDAZOLE (FLAGYL) IVPB 500 mg        500 mg 100 mL/hr over 60 Minutes Intravenous 2 times daily 05/30/23 0241     05/30/23 1000  cefTRIAXone (ROCEPHIN) 2 g in sodium chloride 0.9 % 100 mL IVPB        2 g 200 mL/hr over 30 Minutes Intravenous Daily 05/30/23 0241     05/30/23 0215  ceFEPIme (MAXIPIME) 2 g in sodium chloride 0.9 % 100 mL IVPB        2 g 200 mL/hr over 30 Minutes Intravenous  Once 05/30/23 0207 05/30/23 0333   05/30/23 0215  metroNIDAZOLE (FLAGYL) IVPB 500 mg        500 mg 100 mL/hr over 60 Minutes Intravenous  Once 05/30/23 0207 05/30/23 0429        Subjective: Patient seen and examined at bedside.  Poor historian.  No fever, vomiting, agitation or seizures reported. Objective: Vitals:   05/31/23 0717 05/31/23 1606 05/31/23 2000 06/01/23 0548  BP: 131/68 118/87 125/84 137/84  Pulse: 72 80 87 78  Resp: 16 16   18   Temp: 97.9 F (36.6 C) 98 F (36.7 C) 98 F (36.7 C) 97.7 F (36.5 C)  TempSrc: Oral Oral Oral Oral  SpO2: 100% 100% 100% 98%  Weight:      Height:        Intake/Output Summary (Last 24 hours) at 06/01/2023 0738 Last data filed at 05/31/2023 1800 Gross per 24 hour  Intake 240 ml  Output --  Net 240 ml   Filed Weights   05/29/23 2311 05/31/23 0500  Weight: 44 kg 44 kg    Examination:  General: Currently on room air.  No distress.  Chronically ill and deconditioned looking. ENT/neck: No thyromegaly.  JVD is not elevated  respiratory: Decreased breath sounds at bases bilaterally with some crackles; no wheezing  CVS: S1-S2 heard, rate controlled currently Abdominal: Soft, nontender, slightly distended; no organomegaly, bowel sounds are heard normally Extremities: Trace lower extremity edema; no cyanosis  CNS: Awake; still slow to respond.  Poor historian.  No focal neurologic deficit.  Moves extremities Lymph: No obvious lymphadenopathy Skin: No obvious ecchymosis/lesions  psych: Mostly flat affect.  Currently not agitated. musculoskeletal: No obvious joint swelling/deformity    Data Reviewed: I have personally reviewed following labs and imaging studies  CBC: Recent Labs  Lab 05/29/23 2320 05/29/23 2341 05/30/23 0529 05/31/23 0628 05/31/23 1507 06/01/23 0611  WBC 26.7*  --  21.7* 10.8*  --  9.2  NEUTROABS 23.9*  --  18.5* 8.2*  --  7.1  HGB 8.4* 8.8* 9.0* 7.2* 8.3* 7.2*  HCT 25.7* 26.0* 25.9* 21.4* 24.6* 21.9*  MCV 98.1  --  91.8 95.1  --  97.3  PLT 363  --  291 257  --  254   Basic Metabolic Panel: Recent Labs  Lab 05/29/23 2320 05/29/23 2341 05/30/23 0529 05/31/23 0628 06/01/23 0611  NA 139 138 137 139 139  K 3.7 3.7 3.4* 3.3* 3.2*  CL 106 106 106 111 110  CO2 22  --  19* 20* 22  GLUCOSE 189* 187* 126* 77 100*  BUN 47* 40* 36* 15 13  CREATININE 0.60 0.40* 0.37* <0.30* <0.30*  CALCIUM 9.0  --  8.5* 8.3* 8.3*  MG  --   --  1.7 1.6* 1.9    GFR: CrCl cannot be calculated (This lab value cannot be used to calculate CrCl because it is not a number: <0.30). Liver Function Tests: Recent Labs  Lab 05/29/23 2320 05/30/23 0529  AST 25 24  ALT 19 19  ALKPHOS 169* 143*  BILITOT 0.5 2.2*  PROT 5.6* 5.1*  ALBUMIN 2.8* 2.7*   No results for input(s): "LIPASE", "AMYLASE" in the last 168 hours. No results for input(s): "AMMONIA" in the last 168 hours. Coagulation Profile: Recent Labs  Lab 05/29/23 2320  INR 1.0   Cardiac Enzymes: No results for input(s): "CKTOTAL", "CKMB", "CKMBINDEX", "TROPONINI" in the last 168 hours. BNP (last 3 results) No results for input(s): "PROBNP" in the last 8760 hours. HbA1C: No results for input(s): "HGBA1C" in the last 72 hours. CBG: Recent Labs  Lab 05/30/23 1623  GLUCAP 104*   Lipid Profile: No results for input(s): "CHOL", "HDL", "LDLCALC", "TRIG", "CHOLHDL", "LDLDIRECT" in the last 72 hours. Thyroid Function Tests: No results for input(s): "TSH", "T4TOTAL", "FREET4", "T3FREE", "THYROIDAB" in the last 72 hours. Anemia Panel: Recent Labs    05/30/23 0529  FERRITIN 426*  TIBC 256  IRON 216*   Sepsis Labs: Recent Labs  Lab 05/29/23 2342 05/30/23 0225  LATICACIDVEN 2.6* 1.8    Recent Results (from the past 240 hours)  Culture, blood (single)     Status: None (Preliminary result)   Collection Time: 05/30/23  5:29 AM   Specimen: BLOOD  Result Value Ref Range Status   Specimen Description BLOOD BLOOD RIGHT ARM  Final   Special Requests   Final    BOTTLES DRAWN AEROBIC AND ANAEROBIC Blood Culture adequate volume   Culture   Final    NO GROWTH < 12 HOURS Performed at [facility], [address] [place], [place] [address]    Report Status PENDING  Incomplete         Radiology Studies: No results found.       Scheduled Meds:  sodium chloride   Intravenous Once   pantoprazole (PROTONIX) IV  40 mg Intravenous Q8H   Followed by   Melene Muller ON 06/02/2023]  pantoprazole (PROTONIX) IV  40 mg Intravenous Q12H   Continuous Infusions:  sodium chloride 75 mL/hr at 05/31/23 1636   cefTRIAXone (ROCEPHIN)  IV 2 g (05/31/23 1026)   metronidazole 500 mg (05/31/23 2141)          Glade Lloyd, MD [facility] 06/01/2023, 7:38 AM

## 2023-06-01 NOTE — Plan of Care (Signed)
  Problem: Nutrition: Goal: Adequate nutrition will be maintained Outcome: Progressing   Problem: Coping: Goal: Level of anxiety will decrease Outcome: Progressing   Problem: Elimination: Goal: Will not experience complications related to bowel motility Outcome: Progressing   Problem: Pain Managment: Goal: General experience of comfort will improve and/or be controlled Outcome: Progressing   Problem: Safety: Goal: Ability to remain free from injury will improve Outcome: Progressing

## 2023-06-01 NOTE — Telephone Encounter (Signed)
Patient son called and stated that he spoke with Dr. Rhea Belton and Mike Gip regarding his mother that is currently at the hospital. Patient son stated that his father had something similar to what is going on with his mother and they figured out that his father actually had a Ulcer in the duodenum. Patient son also stated that his father was given a suppository for the ulcer. Patient son is requesting a call back today from either Dr. Rhea Belton or Mike Gip at [phone]. Please advise.

## 2023-06-01 NOTE — Progress Notes (Signed)
Patient ID: Jodi Jefferson, female   DOB: 06-26-1931, 89 y.o.   MRN: [id number]    Progress Note   Subjective   Day # 3 CC;acute GI bleed, epigastric pain  IV PPI twice daily  Labs today-WBC 9.2/hemoglobin 7.2/hematocrit 21.9-stable since yesterday-was transfused 1 unit yesterday  Gastric biopsy-no H. pylori, no intestinal metaplasia or dysplasia no significant diagnostic alteration  Patient feeling okay denying any abdominal pain no complaints of nausea or vomiting.  Does not appear to be eating much, son at bedside had finished her tray No reports of melena   Objective   Vital signs in last 24 hours: Temp:  [97.7 F (36.5 C)-98.3 F (36.8 C)] 98.3 F (36.8 C) (02/11 1547) Pulse Rate:  [73-87] 78 (02/11 1547) Resp:  [15-18] 15 (02/11 0800) BP: (118-137)/(64-87) 128/66 (02/11 1547) SpO2:  [98 %-100 %] 100 % (02/11 0800) Last BM Date : 05/30/23 General:  very Elderly white female in NAD Heart:  Regular rate and rhythm; no murmurs Lungs: Respirations even and unlabored, lungs CTA bilaterally Abdomen:  Soft, nontender and nondistended. Normal bowel sounds. Extremities:  Without edema. Neurologic:  Alert and oriented,  grossly normal neurologically. Psych:  Cooperative. Normal mood and affect.  Intake/Output from previous day: 02/10 0701 - 02/11 0700 In: 440 [P.O.:240; I.V.:100; IV Piggyback:100] Out: -  Intake/Output this shift: No intake/output data recorded.  Lab Results: Recent Labs    05/30/23 0529 05/31/23 0628 05/31/23 1507 06/01/23 0611  WBC 21.7* 10.8*  --  9.2  HGB 9.0* 7.2* 8.3* 7.2*  HCT 25.9* 21.4* 24.6* 21.9*  PLT 291 257  --  254   BMET Recent Labs    05/30/23 0529 05/31/23 0628 06/01/23 0611  NA 137 139 139  K 3.4* 3.3* 3.2*  CL 106 111 110  CO2 19* 20* 22  GLUCOSE 126* 77 100*  BUN 36* 15 13  CREATININE 0.37* <0.30* <0.30*  CALCIUM 8.5* 8.3* 8.3*   LFT Recent Labs    05/30/23 0529  PROT 5.1*  ALBUMIN 2.7*  AST 24  ALT 19   ALKPHOS 143*  BILITOT 2.2*   PT/INR Recent Labs    05/29/23 2320  LABPROT 13.5  INR 1.0      Assessment / Plan:    #12 89 year old white female admitted with acute upper GI bleed and also noted possible proctocolitis on CT scan on admission  EGD showed 3 cratered duodenal ulcers largest 8 mm, all clean-based Biopsies are negative for H. Pylori  From conversation with family yesterday she was taking either aspirin or NSAID recently for hip pain may have been causative factor  Flexible sigmoidoscopy unremarkable other than noting all dark stool in the colon-no evidence of proctosigmoiditis  #2 anemia acute secondary to GI blood loss-EMEA is stable overall though she did get 1 more unit of blood yesterday and hemoglobin still 7.2 She is not showing evidence of active bleeding  #3 history of B12 deficiency #4.  Prior history of peptic ulcer disease #5.  COPD #6 severe bilateral degenerative hip disease in need of hip replacements and will require tooth extractions prior to surgery  Plan; continue twice daily Protonix x 8 weeks, then once daily Protonix thereafter.  Would probably leave her on long-term PPI given prior history of peptic ulcer disease. No aspirin or NSAIDs, family was advised yesterday okay for her to take Tylenol or prescription pain medication if needed Hemoglobin remains stable tomorrow she may be able to be discharged and then would need an  outpatient CBC within a few days to assure stability He does not need to continue antibiotics perspective-she has not had any evidence of proctocolitis (flexible sigmoidoscopy negative this past weekend)   Principal Problem:   Proctocolitis Active Problems:   Essential hypertension   Leukocytosis   Severe sepsis (HCC)   Acute blood loss anemia (ABLA)   Acute lower gastrointestinal bleeding   Melena   Duodenal ulcer     LOS: 2 days   Jodi Appelt EsterwoodPA-C  06/01/2023, 3:51 PM

## 2023-06-01 NOTE — Evaluation (Signed)
Physical Therapy Evaluation Patient Details Name: Jodi Jefferson MRN: [id number] DOB: November 17, 1931 Today's Date: 06/01/2023  History of Present Illness  Pt is a 89 y.o. F who presents 05/29/2023 with acute upper GIB and also noted possible proctocolitis on CT scan on admission. With EGD and flexible sigmoidoscopy pertinent for 3 cratered duodenal ulcers. Significant PMH: HTN, osteoporosis, pelvic fx (2018).  Clinical Impression  PTA, pt lives with her son, requires assist for transfers to and from wheelchair and is dependent for ADL's. Pt son reports pt largely bed bound and has been unable to transfer to and from [facility] recently due to pain. Pt presents with impaired cognition, generalized weakness, decreased balance. Pt requiring maximal assist via squat pivot transfer to maneuver on and off BSC. Would benefit from HHPT at d/c to address deficits and maximize functional mobility. May need PTAR home.      If plan is discharge home, recommend the following: A lot of help with walking and/or transfers;A lot of help with bathing/dressing/bathroom   Can travel by private vehicle        Equipment Recommendations Hospital bed  Recommendations for Other Services       Functional Status Assessment Patient has had a recent decline in their functional status and demonstrates the ability to make significant improvements in function in a reasonable and predictable amount of time.     Precautions / Restrictions Precautions Precautions: Fall Restrictions Weight Bearing Restrictions Per Provider Order: No      Mobility  Bed Mobility Overal bed mobility: Needs Assistance Bed Mobility: Supine to Sit, Sit to Supine     Supine to sit: Mod assist Sit to supine: Max assist   General bed mobility comments: Pt initiating exiting towards right side of bed, assist to execute and use of bed pad to scoot hips out to edge. MaxA    Transfers Overall transfer level: Needs assistance   Transfers: Bed  to chair/wheelchair/BSC       Squat pivot transfers: Max assist     General transfer comment: MaxA via face to face transfer to pivot to and from [facility]    Ambulation/Gait                  Stairs            Wheelchair Mobility     Tilt Bed    Modified Rankin (Stroke Patients Only)       Balance Overall balance assessment: Needs assistance Sitting-balance support: Feet supported Sitting balance-[name]                                       Pertinent Vitals/Pain Pain Assessment Pain Assessment: Faces Faces Pain Scale: No hurt    Home Living Family/patient expects to be discharged to:: Private residence Living Arrangements: Children (son) Available Help at Discharge: Family;Available 24 hours/day Type of Home: House Home Access: Ramped entrance       Home Layout: Able to live on main level with bedroom/bathroom Home Equipment: Wheelchair - Forensic psychologist (2 wheels);BSC/3in1      Prior Function Prior Level of Function : Needs assist             Mobility Comments: using w/c, requiring assist for transfers ADLs Comments: Hospice was assisting with bathing, recently was unable to transfer onto [facility] so pt son was cleaning the pt up in bed     Extremity/Trunk  Assessment   Upper Extremity Assessment Upper Extremity Assessment: Generalized weakness    Lower Extremity Assessment Lower Extremity Assessment: Generalized weakness    Cervical / Trunk Assessment Cervical / Trunk Assessment: Kyphotic  Communication   Communication Communication: No apparent difficulties    Cognition Arousal: Alert Behavior During Therapy: Flat affect   PT - Cognitive impairments: History of cognitive impairments                       PT - Cognition Comments: Undiagnosed dementia; pt son reports pt more confused Following commands: Impaired       Cueing Cueing Techniques: Verbal cues     General Comments       Exercises     Assessment/Plan    PT Assessment Patient needs continued PT services  PT Problem List Decreased strength;Decreased activity tolerance;Decreased balance;Decreased mobility;Decreased cognition;Decreased safety awareness       PT Treatment Interventions Functional mobility training;Therapeutic activities;Therapeutic exercise;Balance training;Patient/family education;Wheelchair mobility training    PT Goals (Current goals can be found in the Care Plan section)  Acute Rehab PT Goals Patient Stated Goal: pt son would like her to be able to get on [facility] again at home PT Goal Formulation: With patient/family Time For Goal Achievement: 06/15/23 Potential to Achieve Goals: Fair    Frequency Min 1X/week     Co-evaluation               AM-PAC PT "6 Clicks" Mobility  Outcome Measure Help needed turning from your back to your side while in a flat bed without using bedrails?: A Lot Help needed moving from lying on your back to sitting on the side of a flat bed without using bedrails?: A Lot Help needed moving to and from a bed to a chair (including a wheelchair)?: A Lot Help needed standing up from a chair using your arms (e.g., wheelchair or bedside chair)?: Total Help needed to walk in hospital room?: Total Help needed climbing 3-5 steps with a railing? : Total 6 Click Score: 9    End of Session Equipment Utilized During Treatment: Gait belt Activity Tolerance: Patient tolerated treatment well Patient left: in bed;with call bell/phone within reach;with bed alarm set Nurse Communication: Mobility status PT Visit Diagnosis: Other abnormalities of gait and mobility (R26.89);Muscle weakness (generalized) (M62.81)    Time: 4098-1191 PT Time Calculation (min) (ACUTE ONLY): 26 min   Charges:   PT Evaluation $PT Eval Low Complexity: 1 Low PT Treatments $Therapeutic Activity: 8-22 mins PT General Charges $$ ACUTE PT VISIT: 1 Visit         Lillia Pauls, PT,  DPT Acute Rehabilitation Services Office [phone]   Norval Morton 06/01/2023, 11:21 AM

## 2023-06-02 ENCOUNTER — Other Ambulatory Visit (HOSPITAL_COMMUNITY): Payer: Self-pay

## 2023-06-02 DIAGNOSIS — D510 Vitamin B12 deficiency anemia due to intrinsic factor deficiency: Secondary | ICD-10-CM | POA: Insufficient documentation

## 2023-06-02 DIAGNOSIS — K529 Noninfective gastroenteritis and colitis, unspecified: Secondary | ICD-10-CM | POA: Diagnosis not present

## 2023-06-02 DIAGNOSIS — Z515 Encounter for palliative care: Secondary | ICD-10-CM

## 2023-06-02 DIAGNOSIS — Z8781 Personal history of (healed) traumatic fracture: Secondary | ICD-10-CM | POA: Insufficient documentation

## 2023-06-02 DIAGNOSIS — Z66 Do not resuscitate: Secondary | ICD-10-CM | POA: Insufficient documentation

## 2023-06-02 DIAGNOSIS — I119 Hypertensive heart disease without heart failure: Secondary | ICD-10-CM

## 2023-06-02 HISTORY — DX: Vitamin B12 deficiency anemia due to intrinsic factor deficiency: D51.0

## 2023-06-02 HISTORY — DX: Hypertensive heart disease without heart failure: I11.9

## 2023-06-02 HISTORY — DX: Personal history of (healed) traumatic fracture: Z87.81

## 2023-06-02 HISTORY — DX: Do not resuscitate: Z66

## 2023-06-02 HISTORY — DX: Encounter for palliative care: Z51.5

## 2023-06-02 LAB — TYPE AND SCREEN
Unit division: 0
Unit division: 0
Unit division: 0

## 2023-06-02 LAB — BASIC METABOLIC PANEL WITH GFR
Anion gap: 8 (ref 5–15)
BUN: 10 mg/dL (ref 8–23)
CO2: 21 mmol/L — ABNORMAL LOW (ref 22–32)
Calcium: 8.3 mg/dL — ABNORMAL LOW (ref 8.9–10.3)
Chloride: 110 mmol/L (ref 98–111)
Creatinine, Ser: <0.3 mg/dL — ABNORMAL LOW (ref 0.44–1.00)
Glucose, Bld: 94 mg/dL (ref 70–99)
Potassium: 3.8 mmol/L (ref 3.5–5.1)
Sodium: 139 mmol/L (ref 135–145)

## 2023-06-02 LAB — CBC WITH DIFFERENTIAL/PLATELET
Abs Immature Granulocytes: 0.06 K/uL (ref 0.00–0.07)
Basophils Absolute: 0 K/uL (ref 0.0–0.1)
Basophils Relative: 1 %
Eosinophils Absolute: 0.2 K/uL (ref 0.0–0.5)
Eosinophils Relative: 2 %
HCT: 22.3 % — ABNORMAL LOW (ref 36.0–46.0)
Hemoglobin: 7.2 g/dL — ABNORMAL LOW (ref 12.0–15.0)
Immature Granulocytes: 1 %
Lymphocytes Relative: 14 %
Lymphs Abs: 1.1 K/uL (ref 0.7–4.0)
MCH: 32.4 pg (ref 26.0–34.0)
MCHC: 32.3 g/dL (ref 30.0–36.0)
MCV: 100.5 fL — ABNORMAL HIGH (ref 80.0–100.0)
Monocytes Absolute: 0.8 K/uL (ref 0.1–1.0)
Monocytes Relative: 10 %
Neutro Abs: 5.8 K/uL (ref 1.7–7.7)
Neutrophils Relative %: 72 %
Platelets: 249 K/uL (ref 150–400)
RBC: 2.22 MIL/uL — ABNORMAL LOW (ref 3.87–5.11)
RDW: 17.5 % — ABNORMAL HIGH (ref 11.5–15.5)
WBC: 8 K/uL (ref 4.0–10.5)
nRBC: 0.4 % — ABNORMAL HIGH (ref 0.0–0.2)

## 2023-06-02 LAB — BPAM RBC
Blood Product Expiration Date: 202502202359
Blood Product Expiration Date: 202502212359
Blood Product Expiration Date: 202502212359
ISSUE DATE / TIME: 202502090040
Unit Type and Rh: 600
Unit Type and Rh: 600
Unit Type and Rh: 600

## 2023-06-02 LAB — MAGNESIUM: Magnesium: 1.7 mg/dL (ref 1.7–2.4)

## 2023-06-02 MED ORDER — PANTOPRAZOLE SODIUM 40 MG PO TBEC: 40.0000 mg | DELAYED_RELEASE_TABLET | Freq: Two times a day (BID) | ORAL | Status: DC

## 2023-06-02 MED ORDER — PANTOPRAZOLE SODIUM 40 MG PO TBEC
40.0000 mg | DELAYED_RELEASE_TABLET | Freq: Two times a day (BID) | ORAL | 2 refills | Status: AC
  Filled 2023-06-02: qty 30, 15d supply, fill #0

## 2023-06-02 NOTE — Discharge Instructions (Signed)
DO NOT TAKE NSAID MEDICATIONS OR ASPIRIN

## 2023-06-02 NOTE — Discharge Summary (Addendum)
 DISCHARGE SUMMARY  Jodi Jefferson  MR#: [id number]  DOB:[birth year]  Date of Admission: 05/29/2023 Date of Discharge: 06/02/2023  Attending Physician:Brennin Durfee Silvestre Gunner, MD  Patient's OZD:[id number], [facility] Family  Disposition: D/C home   Follow-up Appts:  Follow-up Information     [facility], [facility] Follow up in 5 day(s).   Specialty: Family Medicine Why: You will need to have a check of your blood count in 5 days by your primary care provider. Contact information: [address] [place] [address] [phone]                 Tests Needing Follow-up: -f/u CBC is suggested in 5 days   Discharge Diagnoses: Three Duodenal ulcers Acute blood loss anemia due to GIB Hypokalemia HTN Possible undiagnosed underlying dementia Severe bilateral degenerative hip disease in need of hip replacements COPD  Initial presentation: 89 year old with a history of HTN, HOH, and possible undiagnosed underlying dementia who presented to the ER with the acute onset of hematochezia with a hemoglobin of 8.4.  CTa bleeding study suggested possible proctocolitis from the splenic flexure distally without any evidence of obstruction abscess or perforation and no active GI bleeding.  GI was consulted and she underwent EGD and flexible sigmoidoscopy 05/30/23   Hospital Course:  Three Duodenal ulcers Noted on EGD 05/30/2023 -possibly related to use of NSAIDs for hip pain - being treated with Protonix twice daily for 8 weeks then daily thereafter -to avoid aspirin and NSAIDs with patient and son counseled    Acute blood loss anemia due to GIB Hemoglobin holding steady at approximately 7.2 at time of d/c - f/u CBC in 5-7 days is suggested    Possible Proctocolitis - ruled out  Flexible sigmoidoscopy noted dark stool in the colon but otherwise was unrevealing with no convincing evidence of a true proctocolitis   Severe sepsis - ruled out    Hypokalemia Supplemented to  normal   HTN Lisinopril on hold initially as patient had transient modest hypotension - resume after d/c home    Possible undiagnosed underlying dementia Lives with son who cares for her   Severe bilateral degenerative hip disease in need of hip replacements   COPD Well compensated presently   Allergies as of 06/02/2023       Reactions   Aspirin Other (See Comments)   Stomach burns   Macrobid [nitrofurantoin Macrocrystal] Other (See Comments)   Unknown reaction   Motrin [ibuprofen] Other (See Comments)   GI intolerance   Polyethylene Glycol Other (See Comments)   Facial flushing Tingling    Prednisone Other (See Comments)   Unknown reaction   Red Yeast Rice [cholestin] Other (See Comments)   Unknown reaction   Sulfa Antibiotics Other (See Comments)   Unknown reaction   Codeine Nausea Only        Medication List     TAKE these medications    acetaminophen 325 MG tablet Commonly known as: Tylenol Take 2 tablets (650 mg total) by mouth every 6 (six) hours as needed.   amoxicillin 500 MG capsule Commonly known as: AMOXIL Take 2,000 mg by mouth See admin instructions. Take 4 capsules (2000 mg) by mouth one hour prior to dental appointments   bisacodyl 5 MG EC tablet Commonly known as: DULCOLAX Take 2 tablets (10 mg total) by mouth daily as needed for moderate constipation or mild constipation.   bisacodyl 10 MG suppository Commonly known as: DULCOLAX Place 1 suppository (10 mg total) rectally daily as  needed for moderate constipation.   carboxymethylcellulose 0.5 % Soln Commonly known as: REFRESH PLUS Place 1 drop into both eyes 4 (four) times daily as needed (dry eyes).   CENTRUM SILVER PO Take 1 tablet by mouth daily.   lisinopril 10 MG tablet Commonly known as: ZESTRIL Take 10 mg by mouth every evening.   Omega-3 1000 MG Caps Take 2,000 mg by mouth daily.   pantoprazole 40 MG tablet Commonly known as: PROTONIX 1 tablet BID for 8 weeks, then 1  tablet DAILY thereafter        Day of Discharge BP 120/83 (BP Location: Left Arm)   Pulse 73   Temp 97.7 F (36.5 C) (Oral)   Resp 16   Ht 4\' 10"  (1.473 m)   Wt 42.6 kg   SpO2 99%   BMI 19.63 kg/m   Physical Exam: General: No acute respiratory distress Lungs: Clear to auscultation bilaterally without wheezes or crackles Cardiovascular: Regular rate and rhythm without murmur gallop or rub normal S1 and S2 Abdomen: Nontender, nondistended, soft, bowel sounds positive, no rebound, no ascites, no appreciable mass Extremities: No significant cyanosis, clubbing, or edema bilateral lower extremities  Basic Metabolic Panel: Recent Labs  Lab 05/29/23 2320 05/29/23 2341 05/30/23 0529 05/31/23 0628 06/01/23 0611 06/02/23 0632  NA 139 138 137 139 139 139  K 3.7 3.7 3.4* 3.3* 3.2* 3.8  CL 106 106 106 111 110 110  CO2 22  --  19* 20* 22 21*  GLUCOSE 189* 187* 126* 77 100* 94  BUN 47* 40* 36* 15 13 10   CREATININE 0.60 0.40* 0.37* <0.30* <0.30* <0.30*  CALCIUM 9.0  --  8.5* 8.3* 8.3* 8.3*  MG  --   --  1.7 1.6* 1.9 1.7    CBC: Recent Labs  Lab 05/29/23 2320 05/29/23 2341 05/30/23 0529 05/31/23 0628 05/31/23 1507 06/01/23 0611 06/02/23 0632  WBC 26.7*  --  21.7* 10.8*  --  9.2 8.0  NEUTROABS 23.9*  --  18.5* 8.2*  --  7.1 5.8  HGB 8.4*   < > 9.0* 7.2* 8.3* 7.2* 7.2*  HCT 25.7*   < > 25.9* 21.4* 24.6* 21.9* 22.3*  MCV 98.1  --  91.8 95.1  --  97.3 100.5*  PLT 363  --  291 257  --  254 249   < > = values in this interval not displayed.    Time spent in discharge (includes decision making & examination of pt): 35 minutes  06/02/2023, 12:26 PM   Lonia Blood, MD [facility] Office  [phone]

## 2023-06-02 NOTE — TOC Progression Note (Signed)
Transition of Care (TOC) - Progression Note   Spoke to patient and son at bedside.   Son Jodi Jefferson who patient lives with is agreeable to hospice with [facility]. Jodi Jefferson with [facility] aware and will order bed . Jodi Jefferson wants to take patient home via car not ambulance. Son wants bed delivered after discharge  Patient Details  Name: Jodi Jefferson MRN: [id number] Date of Birth: 04/10/1932  Transition of Care [facility]) CM/SW Contact  Jodi Jefferson, Jodi Devon, RN Phone Number: 06/02/2023, 1:12 PM  Clinical Narrative:       Expected Discharge Plan:  (see note) Barriers to Discharge: Continued Medical Work up  Expected Discharge Plan and Services   Discharge Planning Services: CM Consult Post Acute Care Choice:  (see note) Living arrangements for the past 2 months: Single Family Home Expected Discharge Date: 06/02/23                                     Social Determinants of Health (SDOH) Interventions SDOH Screenings   Food Insecurity: No Food Insecurity (05/30/2023)  Housing: Low Risk  (05/30/2023)  Transportation Needs: No Transportation Needs (05/30/2023)  Utilities: Not At Risk (05/30/2023)  Social Connections: Unknown (05/30/2023)  Tobacco Use: Low Risk  (05/30/2023)    Readmission Risk Interventions     No data to display

## 2023-06-02 NOTE — Progress Notes (Signed)
Physical Therapy Treatment Patient Details Name: Jodi Jefferson MRN: [id number] DOB: 03-Dec-1931 Today's Date: 06/02/2023   History of Present Illness Pt is a 89 y.o. F who presents 05/29/2023 with acute upper GIB and also noted possible proctocolitis on CT scan on admission. With EGD and flexible sigmoidoscopy pertinent for 3 cratered duodenal ulcers. Significant PMH: HTN, osteoporosis, pelvic fx (2018).    PT Comments  Patient agreeable to participate with therapy. Son was also present in the room. Today's session initially focused on transferring patient to chair using the stedy machine. Discontinued utilizing the stedy due to patient's inability to stand fully upright. Patient required Max A +1 for squat pivot transfer to chair. Prior to transfer, patient required Mod A +2 with physical assistance to transition from supine > sitting. Son provided emotional support and encouragement during the session for patient's mobility status. Would benefit from HHPT at d/c to address deficits and maximize functional mobility. May need PTAR home.   If plan is discharge home, recommend the following: A lot of help with walking and/or transfers;A lot of help with bathing/dressing/bathroom   Can travel by private vehicle        Equipment Recommendations  Hospital bed    Recommendations for Other Services       Precautions / Restrictions Precautions Precautions: Fall Restrictions Weight Bearing Restrictions Per Provider Order: No     Mobility  Bed Mobility Overal bed mobility: Needs Assistance Bed Mobility: Supine to Sit     Supine to sit: Mod assist     General bed mobility comments: Pt initiating exiting towards right side of bed, assist to execute and use of bed pad to scoot hips out to edge. MaxA    Transfers Overall transfer level: Needs assistance Equipment used: Ambulation equipment used Transfers: Bed to chair/wheelchair/BSC, Sit to/from Stand Sit to Stand: Max assist      Squat pivot transfers: Max assist     General transfer comment: Attempted to use stedy to transfer from bed > chair. Pt unable to full stand upright so that stedy seats can flip down. Opted for Max A  face to face squat pivot transfer to recliner chair Transfer via Lift Equipment: Stedy  Ambulation/Gait                   Stairs             Wheelchair Mobility     Tilt Bed    Modified Rankin (Stroke Patients Only)       Balance Overall balance assessment: Needs assistance Sitting-balance support: Feet supported Sitting balance-[name] Sitting balance - Comments: Requires verbal cues to sit maintain erect position seated Postural control: Posterior lean Standing balance support: Bilateral upper extremity supported, Reliant on assistive device for balance Standing balance-Leahy Scale: Zero Standing balance comment: Patient demonstrates difficulty standing fully upright, reports right knee pain                            Communication Communication Communication: No apparent difficulties  Cognition Arousal: Alert Behavior During Therapy: Flat affect   PT - Cognitive impairments: History of cognitive impairments                       PT - Cognition Comments: Undiagnosed dementia; pt son reports pt more confused Following commands: Impaired Following commands impaired: Follows one step commands with increased time    Cueing Cueing Techniques: Verbal cues,  Tactile cues  Exercises General Exercises - Upper Extremity Shoulder Flexion: AROM, AAROM, Both, Other reps (comment), Seated (2 ea; reaching OH and knee level)    General Comments        Pertinent Vitals/Pain Pain Assessment Pain Assessment: 0-10 Faces Pain Scale: Hurts a little bit Pain Location: R knee Pain Descriptors / Indicators: Aching, Sore Pain Intervention(s): Limited activity within patient's tolerance, Monitored during session, Repositioned    Home  Living                          Prior Function            PT Goals (current goals can now be found in the care plan section) Acute Rehab PT Goals Patient Stated Goal: pt son would like her to be able to get on [facility] again at home PT Goal Formulation: With patient/family Time For Goal Achievement: 06/15/23 Potential to Achieve Goals: Fair Progress towards PT goals: Progressing toward goals    Frequency    Min 1X/week      PT Plan      Co-evaluation              AM-PAC PT "6 Clicks" Mobility   Outcome Measure  Help needed turning from your back to your side while in a flat bed without using bedrails?: A Lot Help needed moving from lying on your back to sitting on the side of a flat bed without using bedrails?: A Lot Help needed moving to and from a bed to a chair (including a wheelchair)?: A Lot Help needed standing up from a chair using your arms (e.g., wheelchair or bedside chair)?: Total Help needed to walk in hospital room?: Total Help needed climbing 3-5 steps with a railing? : Total 6 Click Score: 9    End of Session Equipment Utilized During Treatment: Gait belt Activity Tolerance: Patient tolerated treatment well Patient left: in chair;with call bell/phone within reach;with chair alarm set;with family/visitor present Nurse Communication: Mobility status;Need for lift equipment PT Visit Diagnosis: Other abnormalities of gait and mobility (R26.89);Muscle weakness (generalized) (M62.81)     Time: 4696-2952 PT Time Calculation (min) (ACUTE ONLY): 37 min  Charges:    $Therapeutic Activity: 23-37 mins PT General Charges $$ ACUTE PT VISIT: 1 Visit                     Jodi Jefferson, SPT   Jodi Jefferson 06/02/2023, 12:18 PM

## 2023-06-04 ENCOUNTER — Other Ambulatory Visit (HOSPITAL_COMMUNITY): Payer: Self-pay

## 2023-06-04 LAB — CULTURE, BLOOD (SINGLE)
Culture: NO GROWTH
Special Requests: ADEQUATE

## 2023-06-07 ENCOUNTER — Other Ambulatory Visit (HOSPITAL_COMMUNITY): Payer: Self-pay

## 2023-06-14 ENCOUNTER — Telehealth: Payer: Self-pay | Admitting: Internal Medicine

## 2023-06-14 NOTE — Telephone Encounter (Signed)
 Inbound call from patient's son stating after patient started pantoprazole medication she developed a rash. Requesting a call to discuss other medication alternatives. Please advise, thank you.

## 2023-06-14 NOTE — Telephone Encounter (Addendum)
 Patient received IV Protonix while hospitalized, no reaction noted at that time. Rash is likely due to something else.  Lm on vm for patient's son to return call.

## 2023-06-14 NOTE — Telephone Encounter (Signed)
 PT son is calling to discuss that since she has been on a proton pump inhibitor she has had a rash. Requesting to speak to nurse.

## 2023-06-14 NOTE — Telephone Encounter (Signed)
 Inbound call from patient's son, calling to follow up on message below. He is extremely anxious about the side effects of medication.

## 2023-06-15 NOTE — Telephone Encounter (Signed)
 No return call, will await further communication from pt.

## 2023-06-17 ENCOUNTER — Ambulatory Visit: Payer: Medicare Other | Admitting: Cardiology

## 2023-06-23 ENCOUNTER — Encounter: Payer: Self-pay | Admitting: Cardiology

## 2023-06-23 NOTE — Progress Notes (Deleted)
 Cardiology Office Note:    Date:  06/23/2023   ID:  Jodi Jefferson, DOB [birth year], MRN [id number]  PCP:  [facility]  Cardiologist:  Norman Herrlich, MD   Referring MD: [facility], [facility]*  ASSESSMENT:    1. Chest pain of uncertain etiology   2. Hypertensive heart disease without heart failure   3. Hyperlipemia, mixed    PLAN:    In order of problems listed above:  ***  Next appointment   Medication Adjustments/Labs and Tests Ordered: Current medicines are reviewed at length with the patient today.  Concerns regarding medicines are outlined above.  No orders of the defined types were placed in this encounter.  No orders of the defined types were placed in this encounter.    No chief complaint on file. ***  History of Present Illness:    Jodi Jefferson is a 89 y.o. female who is being seen today for the evaluation of chest pain at the request of [facility], [place] [facility]  She was discharged [facility] 06/01/1998 4:24 day hospitalization with GI bleed duodenal ulcers hemoglobin 7.2 not transfused.  She was seen [facility], ED 05/14/2023 with chest pain described as stabbing troponin was mildly elevated without delta EKG showed sinus rhythm without ischemic changes felt not to have acute coronary syndrome and discharged from the hospital.  She was seen by me in preoperative evaluation 06/10/2020 with a history of hypertension hyperlipidemia and frailty.  An echocardiogram performed 07/10/2020 which showed mild LVH normal ejection fraction 60 to 65% normal right ventricular size function pulmonary artery pressure and no valvular abnormality. Past Medical History:  Diagnosis Date   Abnormal EKG 01/26/2017   Acute blood loss anemia (ABLA) 05/30/2023   Acute lower gastrointestinal bleeding 05/30/2023   Admission for palliative care 06/02/2023   Allergy    Anxiety 01/26/2017   Arthralgia of left knee 11/29/2013   Arthritis  01/26/2017   Benign essential hypertension 01/26/2017   Cataract    Chest pain 01/26/2017   Closed pelvic fracture (HCC) 01/26/2017   Do not resuscitate 06/02/2023   Duodenal ulcer 05/30/2023   Essential hypertension 01/26/2017   GERD (gastroesophageal reflux disease) 01/26/2017   Hyperlipemia, mixed 01/26/2017   Hypertensive heart disease without heart failure 06/02/2023   Hypokalemia 10/18/2021   Inguinal hernia 10/22/2021   Leg swelling 01/02/2019   Leukocytosis 10/18/2021   Macular degeneration 01/26/2017   Melena 05/30/2023   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   Osteoporosis 01/26/2017   Personal history of (healed) traumatic fracture 06/02/2023   Primary osteoarthritis of left knee 11/29/2013   Proctocolitis 05/30/2023   Severe sepsis (HCC) 05/30/2023   Vitamin B12 deficiency anemia due to intrinsic factor deficiency 06/02/2023    Past Surgical History:  Procedure Laterality Date   ABDOMINAL HYSTERECTOMY     APPENDECTOMY     BIOPSY  05/30/2023   Procedure: BIOPSY;  Surgeon: Ottie Glazier, MD;  Location: [facility] ENDOSCOPY;  Service: Gastroenterology;;   COLONOSCOPY     many years ago    ESOPHAGOGASTRODUODENOSCOPY  2010   ESOPHAGOGASTRODUODENOSCOPY (EGD) WITH PROPOFOL N/A 10/22/2021   Procedure: ESOPHAGOGASTRODUODENOSCOPY (EGD) WITH PROPOFOL;  Surgeon: Iva Boop, MD;  Location: [facility] ENDOSCOPY;  Service: Gastroenterology;  Laterality: N/A;   ESOPHAGOGASTRODUODENOSCOPY (EGD) WITH PROPOFOL N/A 05/30/2023   Procedure: ESOPHAGOGASTRODUODENOSCOPY (EGD) WITH PROPOFOL;  Surgeon: Ottie Glazier, MD;  Location[facility] ENDOSCOPY;  Service: Gastroenterology;  Laterality: N/A;   FLEXIBLE SIGMOIDOSCOPY N/A 05/30/2023   Procedure: FLEXIBLE SIGMOIDOSCOPY;  Surgeon: Ottie Glazier, MD;  Location: [facility] ENDOSCOPY;  Service: Gastroenterology;  Laterality: N/A;   REFRACTIVE SURGERY     TONSILLECTOMY      Current Medications: No outpatient medications have been marked as taking for the 06/24/23 encounter  (Appointment) with Baldo Daub, MD.     Allergies:   Aspirin, Macrobid [nitrofurantoin macrocrystal], Motrin [ibuprofen], Polyethylene glycol, Prednisone, Red yeast rice [cholestin], Sulfa antibiotics, and Codeine   Social History   Socioeconomic History   Marital status: Married    Spouse name: Not on file   Number of children: Not on file   Years of education: Not on file   Highest education level: Not on file  Occupational History   Not on file  Tobacco Use   Smoking status: Never   Smokeless tobacco: Never  Vaping Use   Vaping status: Never Used  Substance and Sexual Activity   Alcohol use: No   Drug use: No   Sexual activity: Not on file  Other Topics Concern   Not on file  Social History Narrative   Son Zenaida Niece at bedside [phone].    Social Drivers of Corporate investment banker Strain: Not on file  Food Insecurity: No Food Insecurity (05/30/2023)   Hunger Vital Sign    Worried About Running Out of Food in the Last Year: Never true    Ran Out of Food in the Last Year: Never true  Transportation Needs: No Transportation Needs (05/30/2023)   PRAPARE - Administrator, Civil Service (Medical): No    Lack of Transportation (Non-Medical): No  Physical Activity: Not on file  Stress: Not on file  Social Connections: Unknown (05/30/2023)   Social Connection and Isolation Panel [NHANES]    Frequency of Communication with Friends and Family: Patient unable to answer    Frequency of Social Gatherings with Friends and Family: Patient unable to answer    Attends Religious Services: Patient unable to answer    Active Member of Clubs or Organizations: Patient unable to answer    Attends Banker Meetings: Never    Marital Status: Widowed     Family History: The patient's ***family history includes Heart attack in her father; Prostate cancer in her paternal grandfather. There is no history of Colon cancer, Esophageal cancer, Rectal cancer, or Stomach  cancer.  ROS:   ROS Please see the history of present illness.    *** All other systems reviewed and are negative.  EKGs/Labs/Other Studies Reviewed:    The following studies were reviewed today: ***  Cardiac Studies & Procedures   ______________________________________________________________________________________________     ECHOCARDIOGRAM  ECHOCARDIOGRAM COMPLETE 07/10/2020  Narrative ECHOCARDIOGRAM REPORT    Patient Name:   Jodi Jefferson Date of Exam: 07/10/2020 Medical Rec #:  [id number]            Height:       58.5 in Accession #:    [id number]           Weight:       95.0 lb Date of Birth:  1931-09-06            BSA:          1.336 m Patient Age:    88 years             BP:           110/58 mmHg Patient Gender: F  HR:           56 bpm. Exam Location:  [facility]  Procedure: 2D Echo  Indications:    Preoperative cardiovascular examination [Z01.810]  History:        Patient has prior history of Echocardiogram examinations, most recent 01/18/2017. Arrythmias:Abnormal EKG; Risk Factors:Dyslipidemia and Hypertension.  Sonographer:    Louie Boston Referring Phys: [phone] Sanjay Broadfoot J Amatullah Christy  IMPRESSIONS   1. Sigmoid septum noted without significant gradient across LVOT. Left ventricular ejection fraction, by estimation, is 60 to 65%. The left ventricle has normal function. The left ventricle has no regional wall motion abnormalities. There is mild left ventricular hypertrophy. Left ventricular diastolic parameters are consistent with Grade I diastolic dysfunction (impaired relaxation). 2. Right ventricular systolic function is normal. The right ventricular size is normal. There is normal pulmonary artery systolic pressure. 3. The mitral valve is normal in structure. Mild mitral valve regurgitation. No evidence of mitral stenosis. 4. The aortic valve is normal in structure. There is mild thickening of the aortic valve. Aortic valve regurgitation is not  visualized. No aortic stenosis is present. 5. The inferior vena cava is normal in size with greater than 50% respiratory variability, suggesting right atrial pressure of 3 mmHg.  FINDINGS Left Ventricle: Sigmoid septum noted without significant gradient across LVOT. Left ventricular ejection fraction, by estimation, is 60 to 65%. The left ventricle has normal function. The left ventricle has no regional wall motion abnormalities. The left ventricular internal cavity size was normal in size. There is mild left ventricular hypertrophy. Left ventricular diastolic parameters are consistent with Grade I diastolic dysfunction (impaired relaxation).  Right Ventricle: The right ventricular size is normal. No increase in right ventricular wall thickness. Right ventricular systolic function is normal. There is normal pulmonary artery systolic pressure. The tricuspid regurgitant velocity is 2.59 m/s, and with an assumed right atrial pressure of 3 mmHg, the estimated right ventricular systolic pressure is 29.8 mmHg.  Left Atrium: Left atrial size was normal in size.  Right Atrium: Right atrial size was normal in size.  Pericardium: There is no evidence of pericardial effusion.  Mitral Valve: The mitral valve is normal in structure. Mild mitral valve regurgitation. No evidence of mitral valve stenosis.  Tricuspid Valve: The tricuspid valve is normal in structure. Tricuspid valve regurgitation is not demonstrated. No evidence of tricuspid stenosis.  Aortic Valve: The aortic valve is normal in structure. There is mild thickening of the aortic valve. Aortic valve regurgitation is not visualized. No aortic stenosis is present.  Pulmonic Valve: The pulmonic valve was normal in structure. Pulmonic valve regurgitation is not visualized. No evidence of pulmonic stenosis.  Aorta: The aortic root is normal in size and structure.  Venous: The inferior vena cava is normal in size with greater than 50% respiratory  variability, suggesting right atrial pressure of 3 mmHg.  IAS/Shunts: No atrial level shunt detected by color flow Doppler.   LEFT VENTRICLE PLAX 2D LVIDd:         3.90 cm  Diastology LVIDs:         1.90 cm  LV e' medial:    3.48 cm/s LV PW:         1.30 cm  LV E/e' medial:  22.8 LV IVS:        1.30 cm  LV e' lateral:   7.94 cm/s LVOT diam:     1.80 cm  LV E/e' lateral: 10.0 LV SV:         69 LV  SV Index:   52 LVOT Area:     2.54 cm   RIGHT VENTRICLE             IVC RV S prime:     11.50 cm/s  IVC diam: 0.90 cm TAPSE (M-mode): 1.7 cm  LEFT ATRIUM             Index       RIGHT ATRIUM           Index LA diam:        3.30 cm 2.47 cm/m  RA Area:     13.10 cm LA Vol (A2C):   43.6 ml 32.64 ml/m RA Volume:   31.10 ml  23.28 ml/m LA Vol (A4C):   34.3 ml 25.68 ml/m LA Biplane Vol: 39.9 ml 29.87 ml/m AORTIC VALVE LVOT Vmax:   103.00 cm/s LVOT Vmean:  81.700 cm/s LVOT VTI:    0.272 m  AORTA Ao Root diam: 2.90 cm Ao Asc diam:  2.90 cm Ao Desc diam: 1.80 cm  MITRAL VALVE                TRICUSPID VALVE MV Area (PHT): 2.80 cm     TR Peak grad:   26.8 mmHg MV Decel Time: 271 msec     TR Vmax:        259.00 cm/s MV E velocity: 79.30 cm/s MV A velocity: 101.00 cm/s  SHUNTS MV E/A ratio:  0.79         Systemic VTI:  0.27 m Systemic Diam: 1.80 cm  Gypsy Balsam MD Electronically signed by Gypsy Balsam MD Signature Date/Time: 07/10/2020/7:32:44 PM    Final          ______________________________________________________________________________________________      EKG:  EKG is *** ordered today.  The ekg ordered today is personally reviewed and demonstrates ***  Recent Labs: 05/30/2023: ALT 19 06/02/2023: BUN 10; Creatinine, Ser <0.30; Hemoglobin 7.2; Magnesium 1.7; Platelets 249; Potassium 3.8; Sodium 139  Recent Lipid Panel No results found for: "CHOL", "TRIG", "HDL", "CHOLHDL", "VLDL", "LDLCALC", "LDLDIRECT"  Physical Exam:    VS:  There were no vitals  taken for this visit.    Wt Readings from Last 3 Encounters:  06/02/23 93 lb 14.7 oz (42.6 kg)  05/14/23 97 lb (44 kg)  05/01/23 97 lb (44 kg)     GEN: *** Well nourished, well developed in no acute distress HEENT: Normal NECK: No JVD; No carotid bruits LYMPHATICS: No lymphadenopathy CARDIAC: ***RRR, no murmurs, rubs, gallops RESPIRATORY:  Clear to auscultation without rales, wheezing or rhonchi  ABDOMEN: Soft, non-tender, non-distended MUSCULOSKELETAL:  No edema; No deformity  SKIN: Warm and dry NEUROLOGIC:  Alert and oriented x 3 PSYCHIATRIC:  Normal affect     Signed, Norman Herrlich, MD  06/23/2023 12:35 PM    [facility]

## 2023-06-24 ENCOUNTER — Ambulatory Visit: Payer: Medicare Other | Admitting: Cardiology

## 2023-06-24 DIAGNOSIS — R079 Chest pain, unspecified: Secondary | ICD-10-CM

## 2023-06-24 DIAGNOSIS — I119 Hypertensive heart disease without heart failure: Secondary | ICD-10-CM

## 2023-06-24 DIAGNOSIS — E782 Mixed hyperlipidemia: Secondary | ICD-10-CM

## 2023-06-30 ENCOUNTER — Other Ambulatory Visit: Payer: Self-pay

## 2023-07-01 NOTE — Progress Notes (Unsigned)
 Cardiology Office Note:    Date:  07/02/2023   ID:  Jodi Jefferson, DOB 1931/12/18, MRN [id number]  PCP:  [facility]  Cardiologist:  Norman Herrlich, MD   Referring MD: [facility], [facility]*  ASSESSMENT:    1. Preoperative cardiovascular examination   2. Hyperlipemia, mixed   3. Hypertensive heart disease without heart failure   4. Frail elderly   5. Hospice care     PLAN:    In order of problems listed above:  After spending time with her son: The other son on the phone it becomes obvious that she is in hospice care Goals of hospice care is comfort I certainly would not advise elective surgical procedures especially with her degree of debilitation and weakness She is not having oral pain I would not advise dental extractions I told him I cannot imagine doing any cardiac diagnostic testing other than an EKG at this time as she is not a candidate for any cardiac interventions.  Her EKG is improved from the emergency room she is not having angina certainly not a candidate for any cardiac interventional techniques. Is a was taking an ACE inhibitor certainly should not do that any longer I would not advise lipid-lowering therapy Strongly encouraged them to discuss this with the hospice program in [place]  No follow-up is scheduled   Medication Adjustments/Labs and Tests Ordered: Current medicines are reviewed at length with the patient today.  Concerns regarding medicines are outlined above.  Orders Placed This Encounter  Procedures   EKG 12-Lead   No orders of the defined types were placed in this encounter.    No chief complaint on file.   History of Present Illness:    Jodi Jefferson is a 89 y.o. female who is being seen today at the request of her children they are inquiring about her operative risk for dental extraction and hip surgery  Apparently she has infected teeth and was advised to have 7 teeth removed In the past she has  seen a surgeon down in [place] and there is discussion about total hip replacement In the interim she has entered hospice and has really deteriorated he is very weak cachectic and was unable to sit in a chair we had to put her on a exam table and she fell asleep  She voiced no complaints of chest pain edema or shortness of breath I spoke with her 2 sons and confirmed that she is in hospice. We discussed the goals of hospice being comfort I certainly do not think a total hip arthroplasty would improve the quality of her life She is having no oral pain I be very hesitant for her to be driving to [facility] to have dental extractions with her current degree of weakness Her son asked me if she is strong enough to undergo these procedures and I told him it is not an issue of cardiology is an issue of hospice and overall weakness and debilitation At this time I cannot imagine putting through any cardiac diagnostic testing other than her EKG today Her EKG today shows sinus rhythm poor R wave progression consider anteroseptal MI similar to her previous EKG but her previous ischemic T wave changes are no longer present. She was discharged [facility] 06/01/2022 after a hospitalization with GI bleed duodenal ulcers hemoglobin 7.2 not transfused.  She was seen [facility], ED 05/14/2023 with chest pain described as stabbing troponin was mildly elevated without delta EKG showed sinus rhythm  without ischemic changes felt not to have acute coronary syndrome and discharged from the hospital.  She was seen by me in preoperative evaluation 06/10/2020 with a history of hypertension hyperlipidemia and frailty.  An echocardiogram performed 07/10/2020 which showed mild LVH normal ejection fraction 60 to 65% normal right ventricular size function pulmonary artery pressure and no valvular abnormality. Past Medical History:  Diagnosis Date   Abnormal EKG 01/26/2017   Acute blood loss anemia (ABLA) 05/30/2023    Acute lower gastrointestinal bleeding 05/30/2023   Admission for palliative care 06/02/2023   Allergy    Anxiety 01/26/2017   Arthralgia of left knee 11/29/2013   Arthritis 01/26/2017   Benign essential hypertension 01/26/2017   Cataract    Chest pain 01/26/2017   Closed pelvic fracture (HCC) 01/26/2017   Do not resuscitate 06/02/2023   Duodenal ulcer 05/30/2023   Essential hypertension 01/26/2017   GERD (gastroesophageal reflux disease) 01/26/2017   Hyperlipemia, mixed 01/26/2017   Hypertensive heart disease without heart failure 06/02/2023   Hypokalemia 10/18/2021   Inguinal hernia 10/22/2021   Leg swelling 01/02/2019   Leukocytosis 10/18/2021   Macular degeneration 01/26/2017   Melena 05/30/2023   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   Osteoporosis 01/26/2017   Personal history of (healed) traumatic fracture 06/02/2023   Primary osteoarthritis of left knee 11/29/2013   Proctocolitis 05/30/2023   Severe sepsis (HCC) 05/30/2023   Vitamin B12 deficiency anemia due to intrinsic factor deficiency 06/02/2023    Past Surgical History:  Procedure Laterality Date   ABDOMINAL HYSTERECTOMY     APPENDECTOMY     BIOPSY  05/30/2023   Procedure: BIOPSY;  Surgeon: Ottie Glazier, MD;  [facility] ENDOSCOPY;  Service: Gastroenterology;;   COLONOSCOPY     many years ago    ESOPHAGOGASTRODUODENOSCOPY  2010   ESOPHAGOGASTRODUODENOSCOPY (EGD) WITH PROPOFOL N/A 10/22/2021   Procedure: ESOPHAGOGASTRODUODENOSCOPY (EGD) WITH PROPOFOL;  Surgeon: Iva Boop, MD;  Location: [facility] ENDOSCOPY;  Service: Gastroenterology;  Laterality: N/A;   ESOPHAGOGASTRODUODENOSCOPY (EGD) WITH PROPOFOL N/A 05/30/2023   Procedure: ESOPHAGOGASTRODUODENOSCOPY (EGD) WITH PROPOFOL;  Surgeon: Ottie Glazier, MD;  Location: [facility] ENDOSCOPY;  Service: Gastroenterology;  Laterality: N/A;   FLEXIBLE SIGMOIDOSCOPY N/A 05/30/2023   Procedure: FLEXIBLE SIGMOIDOSCOPY;  Surgeon: Ottie Glazier, MD;  Location: [facility] ENDOSCOPY;  Service:  Gastroenterology;  Laterality: N/A;   REFRACTIVE SURGERY     TONSILLECTOMY      Current Medications: Current Meds  Medication Sig   acetaminophen (TYLENOL) 325 MG tablet Take 2 tablets (650 mg total) by mouth every 6 (six) hours as needed.   amoxicillin (AMOXIL) 500 MG capsule Take 2,000 mg by mouth See admin instructions. Take 4 capsules (2000 mg) by mouth one hour prior to dental appointments   bisacodyl (DULCOLAX) 10 MG suppository Place 1 suppository (10 mg total) rectally daily as needed for moderate constipation.   bisacodyl (DULCOLAX) 5 MG EC tablet Take 2 tablets (10 mg total) by mouth daily as needed for moderate constipation or mild constipation.   carboxymethylcellulose (REFRESH PLUS) 0.5 % SOLN Place 1 drop into both eyes 4 (four) times daily as needed (dry eyes).   Multiple Vitamins-Minerals (CENTRUM SILVER PO) Take 1 tablet by mouth daily.   Omega-3 1000 MG CAPS Take 2,000 mg by mouth daily.   pantoprazole (PROTONIX) 40 MG tablet Take 1 tablet (40 mg total) by mouth 2 (two) times daily for 8 weeks, then 1 tablet DAILY thereafter     Allergies:   Aspirin, Macrobid [nitrofurantoin macrocrystal], Motrin [ibuprofen], Polyethylene glycol,  Prednisone, Red yeast rice [cholestin], Sulfa antibiotics, and Codeine   Social History   Socioeconomic History   Marital status: Married    Spouse name: Not on file   Number of children: Not on file   Years of education: Not on file   Highest education level: Not on file  Occupational History   Not on file  Tobacco Use   Smoking status: Never   Smokeless tobacco: Never  Vaping Use   Vaping status: Never Used  Substance and Sexual Activity   Alcohol use: No   Drug use: No   Sexual activity: Not on file  Other Topics Concern   Not on file  Social History Narrative   Son Zenaida Niece at bedside [phone].    Social Drivers of Corporate investment banker Strain: Not on file  Food Insecurity: No Food Insecurity (05/30/2023)   Hunger  Vital Sign    Worried About Running Out of Food in the Last Year: Never true    Ran Out of Food in the Last Year: Never true  Transportation Needs: No Transportation Needs (05/30/2023)   PRAPARE - Administrator, Civil Service (Medical): No    Lack of Transportation (Non-Medical): No  Physical Activity: Not on file  Stress: Not on file  Social Connections: Unknown (05/30/2023)   Social Connection and Isolation Panel [NHANES]    Frequency of Communication with Friends and Family: Patient unable to answer    Frequency of Social Gatherings with Friends and Family: Patient unable to answer    Attends Religious Services: Patient unable to answer    Active Member of Clubs or Organizations: Patient unable to answer    Attends Banker Meetings: Never    Marital Status: Widowed     Family History: The patient's family history includes Heart attack in her father; Prostate cancer in her paternal grandfather. There is no history of Colon cancer, Esophageal cancer, Rectal cancer, or Stomach cancer.  ROS:   ROS Please see the history of present illness.     All other systems reviewed and are negative.  EKGs/Labs/Other Studies Reviewed:    The following studies were reviewed today:   Cardiac Studies & Procedures   ______________________________________________________________________________________________     ECHOCARDIOGRAM  ECHOCARDIOGRAM COMPLETE 07/10/2020  Narrative ECHOCARDIOGRAM REPORT    Patient Name:   KADA FRIESEN Date of Exam: 07/10/2020 Medical Rec #:  [id number]            Height:       58.5 in Accession #:    [id number]           Weight:       95.0 lb Date of Birth:  1931-11-16            BSA:          1.336 m Patient Age:    88 years             BP:           110/58 mmHg Patient Gender: F                    HR:           56 bpm. Exam Location:  [facility]  Procedure: 2D Echo  Indications:    Preoperative cardiovascular examination  [Z01.810]  History:        Patient has prior history of Echocardiogram examinations, most recent 01/18/2017. Arrythmias:Abnormal EKG; Risk Factors:Dyslipidemia and Hypertension.  Sonographer:  Fayrene Fearing Reel Referring Phys: [id number] Alasia Enge J Kaitlen Redford  IMPRESSIONS   1. Sigmoid septum noted without significant gradient across LVOT. Left ventricular ejection fraction, by estimation, is 60 to 65%. The left ventricle has normal function. The left ventricle has no regional wall motion abnormalities. There is mild left ventricular hypertrophy. Left ventricular diastolic parameters are consistent with Grade I diastolic dysfunction (impaired relaxation). 2. Right ventricular systolic function is normal. The right ventricular size is normal. There is normal pulmonary artery systolic pressure. 3. The mitral valve is normal in structure. Mild mitral valve regurgitation. No evidence of mitral stenosis. 4. The aortic valve is normal in structure. There is mild thickening of the aortic valve. Aortic valve regurgitation is not visualized. No aortic stenosis is present. 5. The inferior vena cava is normal in size with greater than 50% respiratory variability, suggesting right atrial pressure of 3 mmHg.  FINDINGS Left Ventricle: Sigmoid septum noted without significant gradient across LVOT. Left ventricular ejection fraction, by estimation, is 60 to 65%. The left ventricle has normal function. The left ventricle has no regional wall motion abnormalities. The left ventricular internal cavity size was normal in size. There is mild left ventricular hypertrophy. Left ventricular diastolic parameters are consistent with Grade I diastolic dysfunction (impaired relaxation).  Right Ventricle: The right ventricular size is normal. No increase in right ventricular wall thickness. Right ventricular systolic function is normal. There is normal pulmonary artery systolic pressure. The tricuspid regurgitant velocity is 2.59 m/s,  and with an assumed right atrial pressure of 3 mmHg, the estimated right ventricular systolic pressure is 29.8 mmHg.  Left Atrium: Left atrial size was normal in size.  Right Atrium: Right atrial size was normal in size.  Pericardium: There is no evidence of pericardial effusion.  Mitral Valve: The mitral valve is normal in structure. Mild mitral valve regurgitation. No evidence of mitral valve stenosis.  Tricuspid Valve: The tricuspid valve is normal in structure. Tricuspid valve regurgitation is not demonstrated. No evidence of tricuspid stenosis.  Aortic Valve: The aortic valve is normal in structure. There is mild thickening of the aortic valve. Aortic valve regurgitation is not visualized. No aortic stenosis is present.  Pulmonic Valve: The pulmonic valve was normal in structure. Pulmonic valve regurgitation is not visualized. No evidence of pulmonic stenosis.  Aorta: The aortic root is normal in size and structure.  Venous: The inferior vena cava is normal in size with greater than 50% respiratory variability, suggesting right atrial pressure of 3 mmHg.  IAS/Shunts: No atrial level shunt detected by color flow Doppler.   LEFT VENTRICLE PLAX 2D LVIDd:         3.90 cm  Diastology LVIDs:         1.90 cm  LV e' medial:    3.48 cm/s LV PW:         1.30 cm  LV E/e' medial:  22.8 LV IVS:        1.30 cm  LV e' lateral:   7.94 cm/s LVOT diam:     1.80 cm  LV E/e' lateral: 10.0 LV SV:         69 LV SV Index:   52 LVOT Area:     2.54 cm   RIGHT VENTRICLE             IVC RV S prime:     11.50 cm/s  IVC diam: 0.90 cm TAPSE (M-mode): 1.7 cm  LEFT ATRIUM  Index       RIGHT ATRIUM           Index LA diam:        3.30 cm 2.47 cm/m  RA Area:     13.10 cm LA Vol (A2C):   43.6 ml 32.64 ml/m RA Volume:   31.10 ml  23.28 ml/m LA Vol (A4C):   34.3 ml 25.68 ml/m LA Biplane Vol: 39.9 ml 29.87 ml/m AORTIC VALVE LVOT Vmax:   103.00 cm/s LVOT Vmean:  81.700 cm/s LVOT VTI:     0.272 m  AORTA Ao Root diam: 2.90 cm Ao Asc diam:  2.90 cm Ao Desc diam: 1.80 cm  MITRAL VALVE                TRICUSPID VALVE MV Area (PHT): 2.80 cm     TR Peak grad:   26.8 mmHg MV Decel Time: 271 msec     TR Vmax:        259.00 cm/s MV E velocity: 79.30 cm/s MV A velocity: 101.00 cm/s  SHUNTS MV E/A ratio:  0.79         Systemic VTI:  0.27 m Systemic Diam: 1.80 cm  Gypsy Balsam MD Electronically signed by Gypsy Balsam MD Signature Date/Time: 07/10/2020/7:32:44 PM    Final          ______________________________________________________________________________________________      Physical Exam:    VS:  BP 103/78   Pulse (!) 106   Ht 4\' 7"  (1.397 m)   Wt 98 lb (44.5 kg)   SpO2 96%   BMI 22.78 kg/m     Wt Readings from Last 3 Encounters:  07/02/23 98 lb (44.5 kg)  06/02/23 93 lb 14.7 oz (42.6 kg)  05/14/23 97 lb (44 kg)    2 GEN: Very frail weak she appears cachectic but has had to pick her up to put her on the exam table her weight was estimated by the family I do not think she weighs more than 90 pounds.  She has marked pallor of her skin and membranes  Well nourished, well developed in no acute distress HEENT: Normal NECK: No JVD; No carotid bruits LYMPHATICS: No lymphadenopathy CARDIAC: RRR, no murmurs, rubs, gallops RESPIRATORY:  Clear to auscultation without rales, wheezing or rhonchi      Signed, Norman Herrlich, MD  07/02/2023 3:51 PM    [facility]

## 2023-07-02 ENCOUNTER — Telehealth: Payer: Self-pay | Admitting: Cardiology

## 2023-07-02 ENCOUNTER — Ambulatory Visit: Attending: Cardiology | Admitting: Cardiology

## 2023-07-02 ENCOUNTER — Encounter: Payer: Self-pay | Admitting: Cardiology

## 2023-07-02 VITALS — BP 103/78 | HR 106 | Ht <= 58 in | Wt 98.0 lb

## 2023-07-02 DIAGNOSIS — E782 Mixed hyperlipidemia: Secondary | ICD-10-CM

## 2023-07-02 DIAGNOSIS — Z01810 Encounter for preprocedural cardiovascular examination: Secondary | ICD-10-CM | POA: Diagnosis not present

## 2023-07-02 DIAGNOSIS — Z515 Encounter for palliative care: Secondary | ICD-10-CM

## 2023-07-02 DIAGNOSIS — I119 Hypertensive heart disease without heart failure: Secondary | ICD-10-CM

## 2023-07-02 DIAGNOSIS — R54 Age-related physical debility: Secondary | ICD-10-CM | POA: Diagnosis not present

## 2023-07-02 DIAGNOSIS — R079 Chest pain, unspecified: Secondary | ICD-10-CM

## 2023-07-02 NOTE — Telephone Encounter (Signed)
 Son wants to know if Dr Dulce Sellar can suggest a new PCP. Please advise

## 2023-07-02 NOTE — Patient Instructions (Signed)
 Medication Instructions:  Your physician recommends that you continue on your current medications as directed. Please refer to the Current Medication list given to you today.  *If you need a refill on your cardiac medications before your next appointment, please call your pharmacy*   Lab Work: None If you have labs (blood work) drawn today and your tests are completely normal, you will receive your results only by: MyChart Message (if you have MyChart) OR A paper copy in the mail If you have any lab test that is abnormal or we need to change your treatment, we will call you to review the results.   Testing/Procedures: None   Follow-Up: At [facility], you and your health needs are our priority.  As part of our continuing mission to provide you with exceptional heart care, we have created designated Provider Care Teams.  These Care Teams include your primary Cardiologist (physician) and Advanced Practice Providers (APPs -  Physician Assistants and Nurse Practitioners) who all work together to provide you with the care you need, when you need it.  We recommend signing up for the patient portal called "MyChart".  Sign up information is provided on this After Visit Summary.  MyChart is used to connect with patients for Virtual Visits (Telemedicine).  Patients are able to view lab/test results, encounter notes, upcoming appointments, etc.  Non-urgent messages can be sent to your provider as well.   To learn more about what you can do with MyChart, go to [email or web address].    Your next appointment:   Patient in Hospice, no follow up needed  Provider:   Norman Herrlich, MD    Other Instructions None

## 2023-07-05 NOTE — Telephone Encounter (Signed)
 Left voice mail

## 2023-07-06 NOTE — Telephone Encounter (Signed)
 Spoke with Zenaida Niece per DPR and gave information about Psychologist, counselling Family medicine and [facility] [employer], fashion/clothing. Zenaida Niece verbalized understanding and had no additional questions.
